# Patient Record
Sex: Female | Born: 1995
Health system: Southern US, Community
[De-identification: ages and names within clinical notes are randomized; demographics above are authoritative.]

## PROBLEM LIST (undated history)

## (undated) DIAGNOSIS — F329 Major depressive disorder, single episode, unspecified: Secondary | ICD-10-CM

## (undated) DIAGNOSIS — M352 Behcet's disease: Secondary | ICD-10-CM

## (undated) DIAGNOSIS — M2550 Pain in unspecified joint: Secondary | ICD-10-CM

## (undated) DIAGNOSIS — S61519A Laceration without foreign body of unspecified wrist, initial encounter: Secondary | ICD-10-CM

## (undated) DIAGNOSIS — F32A Depression, unspecified: Secondary | ICD-10-CM

## (undated) DIAGNOSIS — K219 Gastro-esophageal reflux disease without esophagitis: Secondary | ICD-10-CM

## (undated) DIAGNOSIS — Z8614 Personal history of Methicillin resistant Staphylococcus aureus infection: Secondary | ICD-10-CM

## (undated) DIAGNOSIS — F419 Anxiety disorder, unspecified: Secondary | ICD-10-CM

## (undated) DIAGNOSIS — S66929A Laceration of unspecified muscle, fascia and tendon at wrist and hand level, unspecified hand, initial encounter: Secondary | ICD-10-CM

## (undated) DIAGNOSIS — F4010 Social phobia, unspecified: Secondary | ICD-10-CM

## (undated) DIAGNOSIS — Z8669 Personal history of other diseases of the nervous system and sense organs: Secondary | ICD-10-CM

## (undated) HISTORY — DX: Social phobia, unspecified: F40.10

## (undated) HISTORY — PX: WISDOM TOOTH EXTRACTION: SHX21

## (undated) HISTORY — DX: Major depressive disorder, single episode, unspecified: F32.9

## (undated) HISTORY — DX: Depression, unspecified: F32.A

## (undated) HISTORY — DX: Anxiety disorder, unspecified: F41.9

---
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
## 2014-02-28 ENCOUNTER — Emergency Department (HOSPITAL_COMMUNITY)
Admission: EM | Admit: 2014-02-28 | Discharge: 2014-03-01 | Disposition: A | Discharge status: Home / Self Care | Payer: 59 | Attending: Emergency Medicine | Admitting: Emergency Medicine

## 2014-02-28 ENCOUNTER — Encounter (HOSPITAL_COMMUNITY): Payer: Self-pay | Admitting: Emergency Medicine

## 2014-02-28 DIAGNOSIS — S66922A Laceration of unspecified muscle, fascia and tendon at wrist and hand level, left hand, initial encounter: Secondary | ICD-10-CM | POA: Insufficient documentation

## 2014-02-28 DIAGNOSIS — Z3202 Encounter for pregnancy test, result negative: Secondary | ICD-10-CM | POA: Insufficient documentation

## 2014-02-28 DIAGNOSIS — Z79899 Other long term (current) drug therapy: Secondary | ICD-10-CM | POA: Insufficient documentation

## 2014-02-28 DIAGNOSIS — F321 Major depressive disorder, single episode, moderate: Secondary | ICD-10-CM | POA: Diagnosis present

## 2014-02-28 DIAGNOSIS — S66929A Laceration of unspecified muscle, fascia and tendon at wrist and hand level, unspecified hand, initial encounter: Secondary | ICD-10-CM

## 2014-02-28 DIAGNOSIS — X788XXA Intentional self-harm by other sharp object, initial encounter: Secondary | ICD-10-CM | POA: Insufficient documentation

## 2014-02-28 DIAGNOSIS — F1019 Alcohol abuse with unspecified alcohol-induced disorder: Secondary | ICD-10-CM

## 2014-02-28 DIAGNOSIS — Z7951 Long term (current) use of inhaled steroids: Secondary | ICD-10-CM | POA: Insufficient documentation

## 2014-02-28 DIAGNOSIS — F1010 Alcohol abuse, uncomplicated: Secondary | ICD-10-CM | POA: Diagnosis present

## 2014-02-28 DIAGNOSIS — Z8659 Personal history of other mental and behavioral disorders: Secondary | ICD-10-CM | POA: Diagnosis not present

## 2014-02-28 DIAGNOSIS — S61512A Laceration without foreign body of left wrist, initial encounter: Secondary | ICD-10-CM

## 2014-02-28 DIAGNOSIS — S61519A Laceration without foreign body of unspecified wrist, initial encounter: Secondary | ICD-10-CM

## 2014-02-28 DIAGNOSIS — X838XXA Intentional self-harm by other specified means, initial encounter: Secondary | ICD-10-CM

## 2014-02-28 HISTORY — DX: Laceration of unspecified muscle, fascia and tendon at wrist and hand level, unspecified hand, initial encounter: S66.929A

## 2014-02-28 HISTORY — DX: Laceration without foreign body of unspecified wrist, initial encounter: S61.519A

## 2014-02-28 LAB — URINALYSIS, ROUTINE W REFLEX MICROSCOPIC
Bilirubin Urine: NEGATIVE
Glucose, UA: NEGATIVE mg/dL
Ketones, ur: NEGATIVE mg/dL
Nitrite: NEGATIVE
Protein, ur: NEGATIVE mg/dL
Specific Gravity, Urine: 1.02 (ref 1.005–1.030)
Urobilinogen, UA: 0.2 mg/dL (ref 0.0–1.0)
pH: 5.5 (ref 5.0–8.0)

## 2014-02-28 LAB — COMPREHENSIVE METABOLIC PANEL
ALT: 14 U/L (ref 0–35)
AST: 18 U/L (ref 0–37)
Albumin: 3.9 g/dL (ref 3.5–5.2)
Alkaline Phosphatase: 46 U/L (ref 39–117)
Anion gap: 18 — ABNORMAL HIGH (ref 5–15)
BUN: 14 mg/dL (ref 6–23)
CO2: 22 mEq/L (ref 19–32)
Calcium: 9.2 mg/dL (ref 8.4–10.5)
Chloride: 105 mEq/L (ref 96–112)
Creatinine, Ser: 0.52 mg/dL (ref 0.50–1.10)
GFR calc Af Amer: >90 mL/min (ref >90)
GFR calc non Af Amer: >90 mL/min (ref >90)
Glucose, Bld: 81 mg/dL (ref 70–99)
Potassium: 3.8 mEq/L (ref 3.7–5.3)
Sodium: 145 mEq/L (ref 137–147)
Total Bilirubin: <0.2 mg/dL — ABNORMAL LOW (ref 0.3–1.2)
Total Protein: 8.9 g/dL — ABNORMAL HIGH (ref 6.0–8.3)

## 2014-02-28 LAB — CBC
HCT: 39.5 % (ref 36.0–46.0)
Hemoglobin: 13.3 g/dL (ref 12.0–15.0)
MCH: 28.4 pg (ref 26.0–34.0)
MCHC: 33.7 g/dL (ref 30.0–36.0)
MCV: 84.4 fL (ref 78.0–100.0)
Platelets: 217 10*3/uL (ref 150–400)
RBC: 4.68 MIL/uL (ref 3.87–5.11)
RDW: 13.3 % (ref 11.5–15.5)
WBC: 7.6 10*3/uL (ref 4.0–10.5)

## 2014-02-28 LAB — RAPID URINE DRUG SCREEN, HOSP PERFORMED
Amphetamines: NOT DETECTED
Barbiturates: NOT DETECTED
Benzodiazepines: NOT DETECTED
Cocaine: NOT DETECTED
Opiates: NOT DETECTED
Tetrahydrocannabinol: NOT DETECTED

## 2014-02-28 LAB — URINE MICROSCOPIC-ADD ON

## 2014-02-28 LAB — SALICYLATE LEVEL: Salicylate Lvl: <2 mg/dL — ABNORMAL LOW (ref 2.8–20.0)

## 2014-02-28 LAB — ETHANOL: Alcohol, Ethyl (B): 78 mg/dL — ABNORMAL HIGH (ref 0–11)

## 2014-02-28 LAB — POC URINE PREG, ED: Preg Test, Ur: NEGATIVE

## 2014-02-28 LAB — ACETAMINOPHEN LEVEL: Acetaminophen (Tylenol), Serum: <15 ug/mL (ref 10–30)

## 2014-02-28 MED ORDER — FLUTICASONE PROPIONATE 50 MCG/ACT NA SUSP
1.0000 | Freq: Every day | NASAL | Status: DC
  Administered 2014-02-28: 1 via NASAL
  Filled 2014-02-28 (×2): qty 16

## 2014-02-28 MED ORDER — IBUPROFEN 200 MG PO TABS
600.0000 mg | ORAL_TABLET | Freq: Three times a day (TID) | ORAL | Status: DC | PRN
  Administered 2014-02-28: 600 mg via ORAL
  Filled 2014-02-28: qty 3

## 2014-02-28 MED ORDER — MELATONIN-PYRIDOXINE 3-1 MG PO TABS: 3.0000 mg | ORAL_TABLET | Freq: Every day | ORAL | Status: DC

## 2014-02-28 MED ORDER — ZOLPIDEM TARTRATE 5 MG PO TABS
5.0000 mg | ORAL_TABLET | Freq: Every evening | ORAL | Status: DC | PRN
Start: 2014-02-28 — End: 2014-03-01
  Administered 2014-02-28: 5 mg via ORAL
  Filled 2014-02-28: qty 1

## 2014-02-28 MED ORDER — CERTOLIZUMAB PEGOL 2 X 200 MG/ML ~~LOC~~ KIT: 200.0000 mg | PACK | SUBCUTANEOUS | Status: DC

## 2014-02-28 MED ORDER — LORAZEPAM 2 MG/ML IJ SOLN
1.0000 mg | Freq: Once | INTRAMUSCULAR | Status: AC
  Administered 2014-02-28: 1 mg via INTRAVENOUS
  Filled 2014-02-28: qty 1

## 2014-02-28 MED ORDER — TETANUS-DIPHTH-ACELL PERTUSSIS 5-2.5-18.5 LF-MCG/0.5 IM SUSP
0.5000 mL | Freq: Once | INTRAMUSCULAR | Status: DC
  Filled 2014-02-28: qty 0.5

## 2014-02-28 MED ORDER — LORAZEPAM 1 MG PO TABS
1.0000 mg | ORAL_TABLET | Freq: Three times a day (TID) | ORAL | Status: DC | PRN
  Administered 2014-02-28: 1 mg via ORAL
  Filled 2014-02-28: qty 1

## 2014-02-28 MED ORDER — LIDOCAINE-EPINEPHRINE 2 %-1:100000 IJ SOLN
20.0000 mL | Freq: Once | INTRAMUSCULAR | Status: AC
  Administered 2014-02-28: 2.5 mL
  Filled 2014-02-28: qty 1

## 2014-02-28 MED ORDER — ACETAMINOPHEN 325 MG PO TABS: 650.0000 mg | ORAL_TABLET | ORAL | Status: DC | PRN

## 2014-02-28 MED ORDER — ONDANSETRON HCL 4 MG PO TABS: 4.0000 mg | ORAL_TABLET | Freq: Three times a day (TID) | ORAL | Status: DC | PRN

## 2014-02-28 MED ORDER — FLUOXETINE HCL 10 MG PO CAPS
10.0000 mg | ORAL_CAPSULE | Freq: Every day | ORAL | Status: DC
  Administered 2014-02-28 – 2014-03-01 (×2): 10 mg via ORAL
  Filled 2014-02-28 (×2): qty 1

## 2014-02-28 MED ORDER — NICOTINE 21 MG/24HR TD PT24
21.0000 mg | MEDICATED_PATCH | Freq: Every day | TRANSDERMAL | Status: DC
Start: 2014-02-28 — End: 2014-03-01

## 2014-02-28 MED ORDER — NORGESTIMATE-ETH ESTRADIOL 0.25-35 MG-MCG PO TABS
1.0000 | ORAL_TABLET | Freq: Every day | ORAL | Status: DC
  Administered 2014-02-28: 1 via ORAL

## 2014-02-28 NOTE — ED Provider Notes (Signed)
CSN: [id number]     Arrival date & time 02/28/14  1203 History   First MD Initiated Contact with Patient 02/28/14 1205     Chief Complaint  Patient presents with  . Laceration     (Consider location/radiation/quality/duration/timing/severity/associated sxs/prior Treatment) Patient is a 18 y.o. female presenting with skin laceration. The history is provided by the patient.  Laceration Location:  Shoulder/arm Shoulder/arm laceration location:  L wrist Depth:  Through underlying tissue Quality: straight   Bleeding: arterial and controlled   Laceration mechanism:  Razor Pain details:    Quality:  Aching   Severity:  Mild   Timing:  Constant   Progression:  Unchanged Foreign body present:  No foreign bodies Relieved by:  Nothing Worsened by:  Nothing tried Ineffective treatments:  None tried Tetanus status:  Up to date   Past Medical History  Diagnosis Date  . Depression   . Arthropathy, Behcet's    History reviewed. No pertinent past surgical history. Family History  Problem Relation Age of Onset  . Depression Mother   . ADD / ADHD Brother   . OCD Brother   . Bipolar disorder Maternal Uncle    History  Substance Use Topics  . Smoking status: Never Smoker   . Smokeless tobacco: Never Used  . Alcohol Use: No   OB History   Grav Para Term Preterm Abortions TAB SAB Ect Mult Living                 Review of Systems  Constitutional: Negative for fever.  Respiratory: Negative for cough and shortness of breath.   Gastrointestinal: Negative for vomiting and abdominal pain.  All other systems reviewed and are negative.     Allergies  Sulfamethoxazole-trimethoprim and Bactrim  Home Medications   Prior to Admission medications   Medication Sig Start Date End Date Taking? Authorizing Provider  Certolizumab Pegol (CIMZIA PREFILLED) 2 X 200 MG/ML KIT Inject 200 mg into the skin See admin instructions. Every two weeks    Historical Provider, MD  COLCRYS 0.6 MG  tablet Take 0.12 mg by mouth daily.  02/22/13   Historical Provider, MD  fluticasone (FLONASE) 50 MCG/ACT nasal spray Place 1 spray into both nostrils at bedtime.    Historical Provider, MD  ORTHO-CYCLEN, 28, 0.25-35 MG-MCG tablet Take 1 tablet by mouth at bedtime.  06/13/12   Historical Provider, MD   BP 122/87  Pulse 114  Temp(Src) 98.8 F (37.1 C) (Oral)  Resp 20  SpO2 100%  LMP 02/23/2014 Physical Exam  Nursing note and vitals reviewed. Constitutional: She is oriented to person, place, and time. She appears well-developed and well-nourished. No distress.  HENT:  Head: Normocephalic and atraumatic.  Mouth/Throat: Oropharynx is clear and moist.  Eyes: EOM are normal. Pupils are equal, round, and reactive to light.  Neck: Normal range of motion. Neck supple.  Cardiovascular: Normal rate and regular rhythm.  Exam reveals no friction rub.   No murmur heard. Pulmonary/Chest: Effort normal and breath sounds normal. No respiratory distress. She has no wheezes. She has no rales.  Abdominal: Soft. She exhibits no distension. There is no tenderness. There is no rebound.  Musculoskeletal: Normal range of motion. She exhibits no edema.       Arms: Neurological: She is alert and oriented to person, place, and time.  Skin: She is not diaphoretic.    ED Course  LACERATION REPAIR Date/Time: 02/28/2014 4:45 PM Performed by: Evelina Bucy Authorized by: Evelina Bucy Consent: Verbal consent obtained. Body  area: upper extremity Location details: left wrist Laceration length: 7 cm Foreign bodies: no foreign bodies Tendon involvement: complex Nerve involvement: none Vascular damage: no Anesthesia: local infiltration Local anesthetic: lidocaine 2% with epinephrine Anesthetic total: 3 ml Patient sedated: no Preparation: Patient was prepped and draped in the usual sterile fashion. Irrigation solution: saline Irrigation method: jet lavage Amount of cleaning: standard Debridement:  none Degree of undermining: none Skin closure: 3-0 Prolene Number of sutures: 6 Technique: simple Approximation: close Approximation difficulty: simple Dressing: 4x4 sterile gauze Patient tolerance: Patient tolerated the procedure well with no immediate complications.   (including critical care time) Labs Review Labs Reviewed  COMPREHENSIVE METABOLIC PANEL - Abnormal; Notable for the following:    Total Protein 8.9 (*)    Total Bilirubin <0.2 (*)    Anion gap 18 (*)    All other components within normal limits  URINALYSIS, ROUTINE W REFLEX MICROSCOPIC - Abnormal; Notable for the following:    APPearance CLOUDY (*)    Hgb urine dipstick SMALL (*)    Leukocytes, UA SMALL (*)    All other components within normal limits  ETHANOL - Abnormal; Notable for the following:    Alcohol, Ethyl (B) 78 (*)    All other components within normal limits  SALICYLATE LEVEL - Abnormal; Notable for the following:    Salicylate Lvl <0.7 (*)    All other components within normal limits  URINE MICROSCOPIC-ADD ON - Abnormal; Notable for the following:    Bacteria, UA FEW (*)    Casts GRANULAR CAST (*)    All other components within normal limits  CBC  ACETAMINOPHEN LEVEL  URINE RAPID DRUG SCREEN (HOSP PERFORMED)  POC URINE PREG, ED    Imaging Review No results found.   EKG Interpretation None      MDM   Final diagnoses:  Laceration of wrist with tendon involvement, left, initial encounter  Self-harm, initial encounter    2F presents with L wrist laceration. States was during a panic attack. Did not mean for it to be so deep. Hx of self harm with scratches to hips. Woke up and new boyfriend wasn't at her house and she was angry. No SI. Police taking out IVC. L wrist with deep laceration, tendon sheath exposed. NVI distally. No arterial bleeding, did have arterial bleeding at initial time of injury. Will be seen by Psych. Patient's wrist laceration complicated, has almost full  thickness laceration of flexor carpi ulnaris tendon. I spoke with Dr. Lenon Curt who will see patient as an outpatient if she goes home, but will need to be notified if she's staying as an inpatient so he can perform surgery. Splint ordered for her wrist.   Evelina Bucy, MD 02/28/14 [phone]

## 2014-02-28 NOTE — ED Notes (Signed)
Pt has pajama bottoms, grey tee shirt, and slippers in pt belongings bag.

## 2014-02-28 NOTE — ED Notes (Signed)
Belongings with father.

## 2014-02-28 NOTE — ED Notes (Signed)
Patient presents sad and tearful; denies any current thoughts of self harm, denies HI/AVH. NAD

## 2014-02-28 NOTE — ED Notes (Addendum)
Pt states "upset for a while. I did not want to be upset anymore so I cut myself. I would not do it again. It scared me."   Mingo Amber MD at bedside. Pt has good movement to left hand and fingers. Left radial pulse moderate. Pt denies numbness left hand.

## 2014-02-28 NOTE — ED Notes (Signed)
Bed: RESB Expected date:  Expected time:  Means of arrival:  Comments: EMS-wrist laceration

## 2014-02-28 NOTE — ED Notes (Addendum)
Per EMS pt cut wrist with razor blade in attempt to hurt self resulting in laceration exposing muscle and fat tissue. Blood controlled at present time. Pt states immediately regards what she did and not SI at present time.

## 2014-02-28 NOTE — Progress Notes (Signed)
PHARMACIST - PHYSICIAN ORDER COMMUNICATION  CONCERNING: P&T Medication Policy on Herbal Medications  DESCRIPTION:  This patient's order for:  Melatonin/Pyridoxine  has been noted.  This product(s) is classified as an "herbal" or natural product. Due to a lack of definitive safety studies or FDA approval, nonstandard manufacturing practices, plus the potential risk of unknown drug-drug interactions while on inpatient medications, the Pharmacy and Therapeutics Committee does not permit the use of "herbal" or natural products of this type within [facility].   ACTION TAKEN: The pharmacy department is unable to verify this order at this time and your patient has been informed of this safety policy. Please reevaluate patient's clinical condition at discharge and address if the herbal or natural product(s) should be resumed at that time.   Thanks, Garnet Sierras 02/28/2014

## 2014-02-28 NOTE — ED Notes (Signed)
At 1643, patient admitted to room 34 from [facility]. 18 year old female involuntarily committed after self-inflicted laceration to left wrist. Patient states that she cut her wrist after an argument with her boyfriend. Patient stated, "I just felt so bad. It was like it was all built up. I was not trying to kill myself. I was shocked when I saw all the blood." Patient reports history of depression and anxiety. Patient mood and affect anxious and depressed. Patient currently denies SI/HI/AVH.  Dressing clean, dry, and intact to left wrist. Splint applied to left wrist per orders. Patient denies pain at present, stating that her arm is still numb. Discussed pain management with patient and advised patient of prn medications. Family present at bedside. Patient tearful and anxious regarding overnight stay in unit. Support provided to patient and patient mother through active listening.
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
## 2014-03-01 ENCOUNTER — Encounter (HOSPITAL_COMMUNITY): Payer: Self-pay | Admitting: Psychiatry

## 2014-03-01 DIAGNOSIS — F329 Major depressive disorder, single episode, unspecified: Secondary | ICD-10-CM

## 2014-03-01 DIAGNOSIS — F1010 Alcohol abuse, uncomplicated: Secondary | ICD-10-CM

## 2014-03-01 MED ORDER — NORGESTIMATE-ETH ESTRADIOL 0.25-35 MG-MCG PO TABS: 1.0000 | ORAL_TABLET | Freq: Every day | ORAL | Status: DC

## 2014-03-01 MED ORDER — MELATONIN-PYRIDOXINE 3-1 MG PO TABS: 1.0000 | ORAL_TABLET | Freq: Every day | ORAL | Status: DC

## 2014-03-01 MED ORDER — FLUOXETINE HCL 10 MG PO CAPS: 10.0000 mg | ORAL_CAPSULE | Freq: Every day | ORAL | Status: DC

## 2014-03-01 MED ORDER — FLUTICASONE PROPIONATE 50 MCG/ACT NA SUSP: 1.0000 | Freq: Every day | NASAL | Status: DC

## 2014-03-01 MED ORDER — CERTOLIZUMAB PEGOL 2 X 200 MG/ML ~~LOC~~ KIT: 200.0000 mg | PACK | SUBCUTANEOUS | Status: DC

## 2014-03-01 NOTE — ED Notes (Signed)
Pt's mom into see

## 2014-03-01 NOTE — [facility] Suicide Risk Assessment (Signed)
Suicide Risk Assessment  Discharge Assessment     Demographic Factors:  Adolescent or young adult and Caucasian  Total Time spent with patient: 20 minutes   Psychiatric Specialty Exam: ROS - denies headache, denies chest pain, denies SOB, denies coughing, denies Abdominal Pain or Vomiting, Denies Dysuria/Urgency/Chills, denies rash, denies pain,  but states she is on Cimzia for Behcet's Disease . Forearm bandaged, no evidence of active bleeding at this time.    Blood pressure 116/81, pulse 110, temperature 98 F (36.7 C), temperature source Oral, resp. rate 12, last menstrual period 02/23/2014, SpO2 100.00%.There is no weight on file to calculate BMI.  General Appearance: Fairly Groomed  Engineer, water::  Good  Speech:  Normal Rate  Volume:  Normal  Mood:  Depressed and although affect is reactive   Affect:  Constricted and but reactive affect   Thought Process:  Goal Directed and Linear  Orientation:  Full (Time, Place, and Person)  Thought Content:  denies hallucinations, no delusions  Suicidal Thoughts:  No- at this time denies any suicidal thoughts and contracts for safety on this unit. States " I am scared about having cut- I will never do that again"   Homicidal Thoughts:  No  Memory:  recent and remote grossly intact   Judgement:  Fair  Insight:  Fair  Psychomotor Activity:  Normal  Concentration:  Good  Recall:  Good  Fund of Knowledge:Good  Language: Good  Akathisia:  Negative  Handed:  Right  AIMS (if indicated):     Assets:  Communication Skills Desire for Improvement Resilience Social Support  Sleep:      Musculoskeletal: Strength & Muscle Tone: within normal limits Gait & Station: normal Patient leans: N/A  Mental Status Per Nursing Assessment::   On Admission:   depression, cutting episode, alcohol abuse  Current Mental Status by Physician: NA  Loss Factors: NA  Historical Factors: Impulsivity  Risk Reduction Factors:   Sense of responsibility  to family, Living with another person, especially a relative, Positive social support and Positive therapeutic relationship  Continued Clinical Symptoms:  Depression, mild  Cognitive Features That Contribute To Risk:  None   Suicide Risk:  Minimal: No identifiable suicidal ideation.  Patients presenting with no risk factors but with morbid ruminations; may be classified as minimal risk based on the severity of the depressive symptoms  Discharge Diagnoses:   AXIS I:  Alcohol Abuse; depression AXIS II:  Cluster B Traits AXIS III:   Past Medical History  Diagnosis Date  . Depression   . Arthropathy, Behcet's    AXIS IV:  other psychosocial or environmental problems and problems related to social environment AXIS V:  61-70 mild symptoms  Plan Of Care/Follow-up recommendations:  Activity:  as tolerated Diet:  heart healthy  Is patient on multiple antipsychotic therapies at discharge:  No   Has Patient had three or more failed trials of antipsychotic monotherapy by history:  No  Recommended Plan for Multiple Antipsychotic Therapies: NA    Majorie Santee, PMH-NP 03/01/2014, 12:15 PM

## 2014-03-01 NOTE — Consult Note (Signed)
[facility] Face-to-Face Psychiatry Consult   Reason for Consult: Episode of self cutting/ suicide attempt Referring Physician:  ED Physician WILLENE HOLIAN is an 18 y.o. female. Total Time spent with patient: 30 minutes  Assessment: AXIS I:  Depression NOS, consider Dysthymia. Alcohol Abuse.  AXIS II:  Cluster B Traits AXIS III:   Past Medical History  Diagnosis Date  . Depression   . Arthropathy, Behcet's    AXIS IV:  relationship stressors AXIS V:  69; mild symptoms  Plan:  No evidence of imminent risk to self or others at present.  Dr. Parke Poisson assessed the patient and concurs with the plan.  Subjective:   KARMINA ZUFALL is a 18 y.o. female patient will go to [facility] for depression and alcohol abuse.  HPI:   Patient denies suicidal/homicidal ideations, hallucinations, and drug abuse.  Her mother is in the room during the assessment (patient approved) and feels she is safe to return home.  Both are in agreement for her to attend [place] [facility] for substance abuse and depression; alcoholism runs in her family and wants to head off any future problems.  Miraya is scheduled to see a new psychiatrist in a couple of weeks also.   She will return home where she lives with her parents who are supportive.  HPI Elements:   Severe self inflicted laceration on forearm, in the context of acute stressor. History of low grade depression and prior history of self cutting.  Past Psychiatric History: See heading above  Past Medical History  Diagnosis Date  . Depression   . Arthropathy, Behcet's    Parents live together, patient has one brother. Has an uncle who was alcoholic. Denies suicides in family. Mother has history of depression. Family History  Problem Relation Age of Onset  . Depression Mother   . ADD / ADHD Brother   . OCD Brother   . Bipolar disorder Maternal Uncle            Allergies:   Allergies  Allergen Reactions  . Sulfamethoxazole-Trimethoprim Diarrhea and Other (See  Comments)    Severe painful diarrhe  . Bactrim [Sulfamethoxazole-Tmp Ds] Hives, Nausea Only and Swelling     Objective: Blood pressure 116/81, pulse 110, temperature 98 F (36.7 C), temperature source Oral, resp. rate 12, last menstrual period 02/23/2014, SpO2 100.00%.There is no weight on file to calculate BMI. Results for orders placed during the hospital encounter of 02/28/14 (from the past 72 hour(s))  CBC     Status: None   Collection Time    02/28/14 12:30 PM      Result Value Ref Range   WBC 7.6  4.0 - 10.5 K/uL   RBC 4.68  3.87 - 5.11 MIL/uL   Hemoglobin 13.3  12.0 - 15.0 g/dL   HCT 39.5  36.0 - 46.0 %   MCV 84.4  78.0 - 100.0 fL   MCH 28.4  26.0 - 34.0 pg   MCHC 33.7  30.0 - 36.0 g/dL   RDW 13.3  11.5 - 15.5 %   Platelets 217  150 - 400 K/uL  COMPREHENSIVE METABOLIC PANEL     Status: Abnormal   Collection Time    02/28/14 12:30 PM      Result Value Ref Range   Sodium 145  137 - 147 mEq/L   Potassium 3.8  3.7 - 5.3 mEq/L   Chloride 105  96 - 112 mEq/L   CO2 22  19 - 32 mEq/L   Glucose, Bld  81  70 - 99 mg/dL   BUN 14  6 - 23 mg/dL   Creatinine, Ser 0.52  0.50 - 1.10 mg/dL   Calcium 9.2  8.4 - 10.5 mg/dL   Total Protein 8.9 (*) 6.0 - 8.3 g/dL   Albumin 3.9  3.5 - 5.2 g/dL   AST 18  0 - 37 U/L   ALT 14  0 - 35 U/L   Alkaline Phosphatase 46  39 - 117 U/L   Total Bilirubin <0.2 (*) 0.3 - 1.2 mg/dL   GFR calc non Af Amer >90  >90 mL/min   GFR calc Af Amer >90  >90 mL/min   Comment: (NOTE)     The eGFR has been calculated using the CKD EPI equation.     This calculation has not been validated in all clinical situations.     eGFR's persistently <90 mL/min signify possible Chronic Kidney     Disease.   Anion gap 18 (*) 5 - 15  ETHANOL     Status: Abnormal   Collection Time    02/28/14 12:30 PM      Result Value Ref Range   Alcohol, Ethyl (B) 78 (*) 0 - 11 mg/dL   Comment:            LOWEST DETECTABLE LIMIT FOR     SERUM ALCOHOL IS 11 mg/dL     FOR MEDICAL  PURPOSES ONLY  SALICYLATE LEVEL     Status: Abnormal   Collection Time    02/28/14 12:30 PM      Result Value Ref Range   Salicylate Lvl <1.6 (*) 2.8 - 20.0 mg/dL  ACETAMINOPHEN LEVEL     Status: None   Collection Time    02/28/14 12:30 PM      Result Value Ref Range   Acetaminophen (Tylenol), Serum <15.0  10 - 30 ug/mL   Comment:            THERAPEUTIC CONCENTRATIONS VARY     SIGNIFICANTLY. A RANGE OF 10-30     ug/mL MAY BE AN EFFECTIVE     CONCENTRATION FOR MANY PATIENTS.     HOWEVER, SOME ARE BEST TREATED     AT CONCENTRATIONS OUTSIDE THIS     RANGE.     ACETAMINOPHEN CONCENTRATIONS     >150 ug/mL AT 4 HOURS AFTER     INGESTION AND >50 ug/mL AT 12     HOURS AFTER INGESTION ARE     OFTEN ASSOCIATED WITH TOXIC     REACTIONS.  URINALYSIS, ROUTINE W REFLEX MICROSCOPIC     Status: Abnormal   Collection Time    02/28/14  1:29 PM      Result Value Ref Range   Color, Urine YELLOW  YELLOW   APPearance CLOUDY (*) CLEAR   Specific Gravity, Urine 1.020  1.005 - 1.030   pH 5.5  5.0 - 8.0   Glucose, UA NEGATIVE  NEGATIVE mg/dL   Hgb urine dipstick SMALL (*) NEGATIVE   Bilirubin Urine NEGATIVE  NEGATIVE   Ketones, ur NEGATIVE  NEGATIVE mg/dL   Protein, ur NEGATIVE  NEGATIVE mg/dL   Urobilinogen, UA 0.2  0.0 - 1.0 mg/dL   Nitrite NEGATIVE  NEGATIVE   Leukocytes, UA SMALL (*) NEGATIVE  URINE RAPID DRUG SCREEN (HOSP PERFORMED)     Status: None   Collection Time    02/28/14  1:29 PM      Result Value Ref Range   Opiates NONE DETECTED  NONE DETECTED  Cocaine NONE DETECTED  NONE DETECTED   Benzodiazepines NONE DETECTED  NONE DETECTED   Amphetamines NONE DETECTED  NONE DETECTED   Tetrahydrocannabinol NONE DETECTED  NONE DETECTED   Barbiturates NONE DETECTED  NONE DETECTED   Comment:            DRUG SCREEN FOR MEDICAL PURPOSES     ONLY.  IF CONFIRMATION IS NEEDED     FOR ANY PURPOSE, NOTIFY LAB     WITHIN 5 DAYS.                LOWEST DETECTABLE LIMITS     FOR URINE DRUG SCREEN      Drug Class       Cutoff (ng/mL)     Amphetamine      1000     Barbiturate      200     Benzodiazepine   680     Tricyclics       321     Opiates          300     Cocaine          300     THC              50  URINE MICROSCOPIC-ADD ON     Status: Abnormal   Collection Time    02/28/14  1:29 PM      Result Value Ref Range   WBC, UA 7-10  <3 WBC/hpf   RBC / HPF 0-2  <3 RBC/hpf   Bacteria, UA FEW (*) RARE   Casts GRANULAR CAST (*) NEGATIVE   Urine-Other MUCOUS PRESENT    POC URINE PREG, ED     Status: None   Collection Time    02/28/14  1:39 PM      Result Value Ref Range   Preg Test, Ur NEGATIVE  NEGATIVE   Comment:            THE SENSITIVITY OF THIS     METHODOLOGY IS >24 mIU/mL   Labs are reviewed and are pertinent for  (+) BAL upon admission  Current Facility-Administered Medications  Medication Dose Route Frequency Provider Last Rate Last Dose  . acetaminophen (TYLENOL) tablet 650 mg  650 mg Oral Q4H PRN Evelina Bucy, MD      . Derrill Memo ON 22/48/2500] Certolizumab Pegol KIT 200 mg  200 mg Subcutaneous Once every 10 days Evelina Bucy, MD      . FLUoxetine (PROZAC) capsule 10 mg  10 mg Oral Daily Neita Garnet, MD   10 mg at 03/01/14 1108  . fluticasone (FLONASE) 50 MCG/ACT nasal spray 1 spray  1 spray Each Nare QHS Evelina Bucy, MD   1 spray at 02/28/14 2144  . ibuprofen (ADVIL,MOTRIN) tablet 600 mg  600 mg Oral Q8H PRN Evelina Bucy, MD   600 mg at 02/28/14 2020  . LORazepam (ATIVAN) tablet 1 mg  1 mg Oral Q8H PRN Evelina Bucy, MD   1 mg at 02/28/14 2123  . nicotine (NICODERM CQ - dosed in mg/24 hours) patch 21 mg  21 mg Transdermal Daily Evelina Bucy, MD      . norgestimate-ethinyl estradiol (ORTHO-CYCLEN,SPRINTEC,PREVIFEM) 0.25-35 MG-MCG tablet 1 tablet  1 tablet Oral QHS Mariea Clonts, MD   1 tablet at 02/28/14 2126  . ondansetron (ZOFRAN) tablet 4 mg  4 mg Oral Q8H PRN Evelina Bucy, MD      . Tdap Durwin Reges) injection 0.5 mL  0.5 mL Intramuscular Once Evelina Bucy,  MD       . zolpidem (AMBIEN) tablet 5 mg  5 mg Oral QHS PRN Evelina Bucy, MD   5 mg at 02/28/14 2123   Current Outpatient Prescriptions  Medication Sig Dispense Refill  . Certolizumab Pegol (CIMZIA PREFILLED) 2 X 200 MG/ML KIT Inject 200 mg into the skin See admin instructions. Every 10 days      . fluticasone (FLONASE) 50 MCG/ACT nasal spray Place 1 spray into both nostrils at bedtime.      . Melatonin-Pyridoxine (MELATIN PO) Take 1 tablet by mouth daily.      . ORTHO-CYCLEN, 28, 0.25-35 MG-MCG tablet Take 1 tablet by mouth at bedtime.         Psychiatric Specialty Exam: ROS - denies headache, denies chest pain, denies SOB, denies coughing, denies Abdominal Pain or Vomiting, Denies Dysuria/Urgency/Chills, denies rash, denies pain,  but states she is on Cimzia for Behcet's Disease . Forearm bandaged, no evidence of active bleeding at this time.    Blood pressure 116/81, pulse 110, temperature 98 F (36.7 C), temperature source Oral, resp. rate 12, last menstrual period 02/23/2014, SpO2 100.00%.There is no weight on file to calculate BMI.  General Appearance: Fairly Groomed  Engineer, water::  Good  Speech:  Normal Rate  Volume:  Normal  Mood:  Depressed and although affect is reactive   Affect:  Constricted and but reactive affect   Thought Process:  Goal Directed and Linear  Orientation:  Full (Time, Place, and Person)  Thought Content:  denies hallucinations, no delusions  Suicidal Thoughts:  No- at this time denies any suicidal thoughts and contracts for safety on this unit. States " I am scared about having cut- I will never do that again"   Homicidal Thoughts:  No  Memory:  recent and remote grossly intact   Judgement:  Fair  Insight:  Fair  Psychomotor Activity:  Normal  Concentration:  Good  Recall:  Good  Fund of Knowledge:Good  Language: Good  Akathisia:  Negative  Handed:  Right  AIMS (if indicated):     Assets:  Communication Skills Desire for Improvement Resilience Social  Support  Sleep:      Musculoskeletal: Strength & Muscle Tone: within normal limits Gait & Station: normal Patient leans: N/A  Treatment Plan Summary: Discharge home with her mother and follow-up with [facility] for depression and alcohol abuse, psychiatrist appointment in 2 weeks.   Waylan Boga, [place] 03/01/2014 12:09 PM  Patient seen and assessed with NP as above

## 2014-03-01 NOTE — BH Assessment (Signed)
Clinician provided patient information to [facility] IOP as recommended by Dr. Parke Poisson. Patient inquired if she was being discharged today. Clinician confirmed plan to discharge later today.   Rigoberto Noel, MSW, LCSW Triage Specialist [phone]

## 2014-03-01 NOTE — ED Notes (Signed)
Dr Parke Poisson and jamisonj np in w/ pt and her mother

## 2014-03-01 NOTE — ED Notes (Addendum)
Splint intact, dressing CDI. Written dc instructions reviewed w/ Pt and mom.  Pt encouraged to call Dr Lenon Curt Monday as instructed, call IOP on Monday and schedule her appt, keep her scheduled appt in 2 weeks and take her medications as directed.  Watch for infection (redness/swelling/drainage/unusual pain) and changes in circulation/sensation in fingers and to wear the splint until she sees Dr Lenon Curt.  Both verbalized understanding.  Pt ambulatory to dc window w/ mom and mHt. Pt has not belongings.

## 2014-03-01 NOTE — ED Notes (Signed)
Dr Parke Poisson and Theodoro Clock NP into see

## 2014-03-01 NOTE — ED Notes (Signed)
IVC has been rescinded

## 2014-03-03 ENCOUNTER — Other Ambulatory Visit: Payer: Self-pay | Admitting: Orthopedic Surgery

## 2014-03-03 ENCOUNTER — Encounter (HOSPITAL_BASED_OUTPATIENT_CLINIC_OR_DEPARTMENT_OTHER): Payer: Self-pay | Admitting: *Deleted

## 2014-03-04 ENCOUNTER — Encounter (HOSPITAL_BASED_OUTPATIENT_CLINIC_OR_DEPARTMENT_OTHER): Payer: Self-pay

## 2014-03-04 ENCOUNTER — Encounter (HOSPITAL_BASED_OUTPATIENT_CLINIC_OR_DEPARTMENT_OTHER)
Admission: RE | Disposition: A | Discharge status: Home / Self Care | Payer: Self-pay | Source: Ambulatory Visit | Attending: Orthopedic Surgery

## 2014-03-04 ENCOUNTER — Ambulatory Visit (HOSPITAL_BASED_OUTPATIENT_CLINIC_OR_DEPARTMENT_OTHER)
Admission: RE | Admit: 2014-03-04 | Discharge: 2014-03-04 | Disposition: A | Discharge status: Home / Self Care | Payer: 59 | Source: Ambulatory Visit | Attending: Orthopedic Surgery | Admitting: Orthopedic Surgery

## 2014-03-04 ENCOUNTER — Ambulatory Visit (HOSPITAL_BASED_OUTPATIENT_CLINIC_OR_DEPARTMENT_OTHER): Payer: 59 | Admitting: Anesthesiology

## 2014-03-04 ENCOUNTER — Encounter (HOSPITAL_BASED_OUTPATIENT_CLINIC_OR_DEPARTMENT_OTHER): Payer: 59 | Admitting: Anesthesiology

## 2014-03-04 DIAGNOSIS — Z882 Allergy status to sulfonamides status: Secondary | ICD-10-CM | POA: Insufficient documentation

## 2014-03-04 DIAGNOSIS — S66822A Laceration of other specified muscles, fascia and tendons at wrist and hand level, left hand, initial encounter: Secondary | ICD-10-CM | POA: Insufficient documentation

## 2014-03-04 DIAGNOSIS — Z888 Allergy status to other drugs, medicaments and biological substances status: Secondary | ICD-10-CM | POA: Diagnosis not present

## 2014-03-04 DIAGNOSIS — S55012A Laceration of ulnar artery at forearm level, left arm, initial encounter: Secondary | ICD-10-CM | POA: Insufficient documentation

## 2014-03-04 DIAGNOSIS — Z91048 Other nonmedicinal substance allergy status: Secondary | ICD-10-CM | POA: Insufficient documentation

## 2014-03-04 DIAGNOSIS — Y9389 Activity, other specified: Secondary | ICD-10-CM | POA: Insufficient documentation

## 2014-03-04 DIAGNOSIS — S61512A Laceration without foreign body of left wrist, initial encounter: Secondary | ICD-10-CM | POA: Diagnosis present

## 2014-03-04 DIAGNOSIS — Z91011 Allergy to milk products: Secondary | ICD-10-CM | POA: Diagnosis not present

## 2014-03-04 DIAGNOSIS — Z8614 Personal history of Methicillin resistant Staphylococcus aureus infection: Secondary | ICD-10-CM | POA: Diagnosis not present

## 2014-03-04 DIAGNOSIS — Y9289 Other specified places as the place of occurrence of the external cause: Secondary | ICD-10-CM | POA: Diagnosis not present

## 2014-03-04 DIAGNOSIS — Y998 Other external cause status: Secondary | ICD-10-CM | POA: Diagnosis not present

## 2014-03-04 DIAGNOSIS — X788XXA Intentional self-harm by other sharp object, initial encounter: Secondary | ICD-10-CM | POA: Diagnosis not present

## 2014-03-04 DIAGNOSIS — M352 Behcet's disease: Secondary | ICD-10-CM | POA: Insufficient documentation

## 2014-03-04 DIAGNOSIS — F17210 Nicotine dependence, cigarettes, uncomplicated: Secondary | ICD-10-CM | POA: Insufficient documentation

## 2014-03-04 DIAGNOSIS — F329 Major depressive disorder, single episode, unspecified: Secondary | ICD-10-CM | POA: Diagnosis not present

## 2014-03-04 HISTORY — DX: Behcet's disease: M35.2

## 2014-03-04 HISTORY — DX: Personal history of other diseases of the nervous system and sense organs: Z86.69

## 2014-03-04 HISTORY — DX: Laceration without foreign body of unspecified wrist, initial encounter: S61.519A

## 2014-03-04 HISTORY — DX: Personal history of Methicillin resistant Staphylococcus aureus infection: Z86.14

## 2014-03-04 HISTORY — DX: Laceration of unspecified muscle, fascia and tendon at wrist and hand level, unspecified hand, initial encounter: S66.929A

## 2014-03-04 HISTORY — PX: ARTERY AND TENDON REPAIR: SHX5696

## 2014-03-04 HISTORY — PX: WOUND EXPLORATION: SHX6188

## 2014-03-04 LAB — POCT HEMOGLOBIN-HEMACUE: Hemoglobin: 11.3 g/dL — ABNORMAL LOW (ref 12.0–15.0)

## 2014-03-04 SURGERY — WOUND EXPLORATION
Anesthesia: General | Site: Wrist | Laterality: Left

## 2014-03-04 MED ORDER — DEXAMETHASONE SODIUM PHOSPHATE 4 MG/ML IJ SOLN
INTRAMUSCULAR | Status: DC | PRN
  Administered 2014-03-04: 5 mg via INTRAVENOUS

## 2014-03-04 MED ORDER — BUPIVACAINE HCL (PF) 0.25 % IJ SOLN
INTRAMUSCULAR | Status: DC | PRN
  Administered 2014-03-04: 8 mL

## 2014-03-04 MED ORDER — LIDOCAINE HCL (CARDIAC) 20 MG/ML IV SOLN
INTRAVENOUS | Status: DC | PRN
  Administered 2014-03-04: 100 mg via INTRAVENOUS

## 2014-03-04 MED ORDER — DOXYCYCLINE HYCLATE 50 MG PO CAPS: 100.0000 mg | ORAL_CAPSULE | Freq: Two times a day (BID) | ORAL | Status: DC

## 2014-03-04 MED ORDER — MIDAZOLAM HCL 2 MG/2ML IJ SOLN
INTRAMUSCULAR | Status: AC
  Filled 2014-03-04: qty 2

## 2014-03-04 MED ORDER — FENTANYL CITRATE 0.05 MG/ML IJ SOLN
INTRAMUSCULAR | Status: DC | PRN
  Administered 2014-03-04: 50 ug via INTRAVENOUS

## 2014-03-04 MED ORDER — ONDANSETRON HCL 4 MG/2ML IJ SOLN
INTRAMUSCULAR | Status: DC | PRN
  Administered 2014-03-04: 4 mg via INTRAVENOUS

## 2014-03-04 MED ORDER — MIDAZOLAM HCL 2 MG/2ML IJ SOLN: 1.0000 mg | INTRAMUSCULAR | Status: DC | PRN

## 2014-03-04 MED ORDER — LIDOCAINE HCL (PF) 1 % IJ SOLN
INTRAVENOUS | Status: DC | PRN
  Administered 2014-03-04: 16:00:00

## 2014-03-04 MED ORDER — PROPOFOL 10 MG/ML IV BOLUS
INTRAVENOUS | Status: DC | PRN
  Administered 2014-03-04: 150 mg via INTRAVENOUS

## 2014-03-04 MED ORDER — CEFAZOLIN SODIUM 1-5 GM-% IV SOLN
INTRAVENOUS | Status: AC
  Filled 2014-03-04: qty 50

## 2014-03-04 MED ORDER — CEFAZOLIN SODIUM-DEXTROSE 2-3 GM-% IV SOLR
2.0000 g | INTRAVENOUS | Status: AC
  Administered 2014-03-04: 1 g via INTRAVENOUS

## 2014-03-04 MED ORDER — FENTANYL CITRATE 0.05 MG/ML IJ SOLN: 50.0000 ug | INTRAMUSCULAR | Status: DC | PRN

## 2014-03-04 MED ORDER — HYDROCODONE-ACETAMINOPHEN 5-325 MG PO TABS: ORAL_TABLET | ORAL | Status: DC

## 2014-03-04 MED ORDER — MIDAZOLAM HCL 5 MG/5ML IJ SOLN
INTRAMUSCULAR | Status: DC | PRN
  Administered 2014-03-04: 2 mg via INTRAVENOUS

## 2014-03-04 MED ORDER — BUPIVACAINE HCL (PF) 0.25 % IJ SOLN
INTRAMUSCULAR | Status: AC
  Filled 2014-03-04: qty 30

## 2014-03-04 MED ORDER — CHLORHEXIDINE GLUCONATE 4 % EX LIQD: 60.0000 mL | Freq: Once | CUTANEOUS | Status: DC

## 2014-03-04 MED ORDER — HEPARIN SODIUM (PORCINE) 1000 UNIT/ML IJ SOLN
INTRAMUSCULAR | Status: AC
  Filled 2014-03-04: qty 1

## 2014-03-04 MED ORDER — LIDOCAINE HCL (PF) 1 % IJ SOLN
INTRAMUSCULAR | Status: AC
  Filled 2014-03-04: qty 30

## 2014-03-04 MED ORDER — MIDAZOLAM HCL 2 MG/ML PO SYRP
12.0000 mg | ORAL_SOLUTION | Freq: Once | ORAL | Status: DC | PRN
Start: 2014-03-04 — End: 2014-03-04

## 2014-03-04 MED ORDER — LACTATED RINGERS IV SOLN
INTRAVENOUS | Status: DC
  Administered 2014-03-04 (×2): via INTRAVENOUS

## 2014-03-04 MED ORDER — PROPOFOL 10 MG/ML IV BOLUS
INTRAVENOUS | Status: AC
  Filled 2014-03-04: qty 20

## 2014-03-04 MED ORDER — FENTANYL CITRATE 0.05 MG/ML IJ SOLN
INTRAMUSCULAR | Status: AC
  Filled 2014-03-04: qty 4

## 2014-03-04 SURGICAL SUPPLY — 60 items
BAG DECANTER FOR FLEXI CONT (MISCELLANEOUS) ×2 IMPLANT
BANDAGE ELASTIC 3 VELCRO ST LF (GAUZE/BANDAGES/DRESSINGS) ×3 IMPLANT
BLADE MINI RND TIP GREEN BEAV (BLADE) IMPLANT
BLADE SURG 15 STRL LF DISP TIS (BLADE) ×4 IMPLANT
BLADE SURG 15 STRL SS (BLADE) ×6
BNDG CMPR 9X4 STRL LF SNTH (GAUZE/BANDAGES/DRESSINGS) ×2
BNDG ESMARK 4X9 LF (GAUZE/BANDAGES/DRESSINGS) ×2 IMPLANT
BNDG GAUZE ELAST 4 BULKY (GAUZE/BANDAGES/DRESSINGS) ×3 IMPLANT
CHLORAPREP W/TINT 26ML (MISCELLANEOUS) ×3 IMPLANT
CORDS BIPOLAR (ELECTRODE) ×3 IMPLANT
COVER BACK TABLE 60X90IN (DRAPES) ×3 IMPLANT
COVER MAYO STAND STRL (DRAPES) ×3 IMPLANT
CUFF TOURNIQUET SINGLE 18IN (TOURNIQUET CUFF) ×2 IMPLANT
DECANTER SPIKE VIAL GLASS SM (MISCELLANEOUS) ×1 IMPLANT
DRAPE EXTREMITY TIBURON (DRAPES) ×3 IMPLANT
DRAPE SURG 17X23 STRL (DRAPES) ×3 IMPLANT
GAUZE SPONGE 4X4 12PLY STRL (GAUZE/BANDAGES/DRESSINGS) ×3 IMPLANT
GAUZE XEROFORM 1X8 LF (GAUZE/BANDAGES/DRESSINGS) ×3 IMPLANT
GLOVE BIO SURGEON STRL SZ7.5 (GLOVE) ×3 IMPLANT
GLOVE BIO SURGEON STRL SZ8.5 (GLOVE) ×2 IMPLANT
GLOVE BIOGEL PI IND STRL 7.0 (GLOVE) ×1 IMPLANT
GLOVE BIOGEL PI IND STRL 8 (GLOVE) ×2 IMPLANT
GLOVE BIOGEL PI INDICATOR 7.0 (GLOVE) ×1
GLOVE BIOGEL PI INDICATOR 8 (GLOVE) ×1
GLOVE ECLIPSE 6.5 STRL STRAW (GLOVE) ×2 IMPLANT
GOWN STRL REUS W/ TWL LRG LVL3 (GOWN DISPOSABLE) ×2 IMPLANT
GOWN STRL REUS W/TWL LRG LVL3 (GOWN DISPOSABLE) ×3
GOWN STRL REUS W/TWL XL LVL3 (GOWN DISPOSABLE) ×5 IMPLANT
LOOP VESSEL MAXI BLUE (MISCELLANEOUS) IMPLANT
NDL HYPO 25X1 1.5 SAFETY (NEEDLE) IMPLANT
NDL SAFETY ECLIPSE 18X1.5 (NEEDLE) ×1 IMPLANT
NEEDLE HYPO 18GX1.5 SHARP (NEEDLE) ×3
NEEDLE HYPO 25X1 1.5 SAFETY (NEEDLE) ×3 IMPLANT
NS IRRIG 1000ML POUR BTL (IV SOLUTION) ×3 IMPLANT
PACK BASIN DAY SURGERY FS (CUSTOM PROCEDURE TRAY) ×3 IMPLANT
PAD CAST 3X4 CTTN HI CHSV (CAST SUPPLIES) ×2 IMPLANT
PAD CAST 4YDX4 CTTN HI CHSV (CAST SUPPLIES) IMPLANT
PADDING CAST ABS 4INX4YD NS (CAST SUPPLIES)
PADDING CAST ABS COTTON 4X4 ST (CAST SUPPLIES) ×1 IMPLANT
PADDING CAST COTTON 3X4 STRL (CAST SUPPLIES) ×3
PADDING CAST COTTON 4X4 STRL (CAST SUPPLIES)
SLEEVE SCD COMPRESS KNEE MED (MISCELLANEOUS) ×2 IMPLANT
SPEAR EYE SURG WECK-CEL (MISCELLANEOUS) ×3 IMPLANT
SPLINT PLASTER CAST XFAST 3X15 (CAST SUPPLIES) ×10 IMPLANT
SPLINT PLASTER XTRA FASTSET 3X (CAST SUPPLIES) ×10
STOCKINETTE 4X48 STRL (DRAPES) ×3 IMPLANT
SUT ETHIBOND 3-0 V-5 (SUTURE) IMPLANT
SUT ETHILON 4 0 PS 2 18 (SUTURE) ×3 IMPLANT
SUT FIBERWIRE 4-0 18 TAPR NDL (SUTURE) ×3
SUT MERSILENE 6 0 P 1 (SUTURE) IMPLANT
SUT NYLON 9 0 VRM6 (SUTURE) ×2 IMPLANT
SUT PROLENE 6 0 P 1 18 (SUTURE) IMPLANT
SUT SILK 4 0 PS 2 (SUTURE) ×2 IMPLANT
SUT SUPRAMID 4-0 (SUTURE) IMPLANT
SUT VICRYL 4-0 PS2 18IN ABS (SUTURE) IMPLANT
SUTURE FIBERWR 4-0 18 TAPR NDL (SUTURE) ×1 IMPLANT
SYR BULB 3OZ (MISCELLANEOUS) ×3 IMPLANT
SYR CONTROL 10ML LL (SYRINGE) ×4 IMPLANT
TOWEL OR 17X24 6PK STRL BLUE (TOWEL DISPOSABLE) ×6 IMPLANT
UNDERPAD 30X30 INCONTINENT (UNDERPADS AND DIAPERS) ×3 IMPLANT

## 2014-03-04 NOTE — Anesthesia Procedure Notes (Signed)
Procedure Name: LMA Insertion Date/Time: 03/04/2014 3:17 PM Performed by: Lyndee Leo Pre-anesthesia Checklist: Patient identified, Emergency Drugs available, Suction available and Patient being monitored Patient Re-evaluated:Patient Re-evaluated prior to inductionOxygen Delivery Method: Circle System Utilized Preoxygenation: Pre-oxygenation with 100% oxygen Intubation Type: IV induction Ventilation: Mask ventilation without difficulty LMA: LMA inserted LMA Size: 3.0 Number of attempts: 1 Airway Equipment and Method: bite block Placement Confirmation: positive ETCO2 Tube secured with: Tape Dental Injury: Teeth and Oropharynx as per pre-operative assessment

## 2014-03-04 NOTE — Anesthesia Postprocedure Evaluation (Signed)
Anesthesia Post Note  Patient: Jacqueline Simpson  Procedure(s) Performed: Procedure(s) (LRB): LEFT WRIST EXPLORATION (Left) ARTERY AND TENDON REPAIR (Left)  Anesthesia type: general  Patient location: PACU  Post pain: Pain level controlled  Post assessment: Patient's Cardiovascular Status Stable  Last Vitals:  Filed Vitals:   03/04/14 1700  BP: 108/67  Pulse: 81  Temp:   Resp: 17    Post vital signs: Reviewed and stable  Level of consciousness: sedated  Complications: No apparent anesthesia complications

## 2014-03-04 NOTE — Discharge Instructions (Addendum)

## 2014-03-04 NOTE — Op Note (Signed)
[id number] 

## 2014-03-04 NOTE — Brief Op Note (Signed)
03/04/2014  4:31 PM  PATIENT:  [name]  18 y.o. female  PRE-OPERATIVE DIAGNOSIS:  Left Wrist Laceraton Tendon/Artery/Nerve  POST-OPERATIVE DIAGNOSIS:  Left Wrist Laceration with FCU, palmaris longus, and ulnar artery lacerations  PROCEDURE:  Procedure(s): LEFT WRIST EXPLORATION (Left) ARTERY AND TENDON REPAIR (Left)  SURGEON:  Surgeon(s) and Role:    * Leanora Cover, MD - Primary    * Daryll Brod, MD - Assisting  PHYSICIAN ASSISTANT:   ASSISTANTS: Daryll Brod, MD   ANESTHESIA:   general  EBL:  Total I/O In: 1000 [I.V.:1000] Out: -   BLOOD ADMINISTERED:none  DRAINS: none   LOCAL MEDICATIONS USED:  MARCAINE     SPECIMEN:  No Specimen  DISPOSITION OF SPECIMEN:  N/A  COUNTS:  YES  TOURNIQUET:   Total Tourniquet Time Documented: Upper Arm (Left) - 56 minutes Total: Upper Arm (Left) - 56 minutes   DICTATION: .Other Dictation: Dictation Number [id number]  PLAN OF CARE: Discharge to home after PACU  PATIENT DISPOSITION:  PACU - hemodynamically stable.

## 2014-03-04 NOTE — H&P (Signed)
  Jacqueline Simpson is an 18 y.o. female.   Chief Complaint: left wrist laceration HPI: 18 yo rhd female states she lacerated left wrist 02/28/14 with razor blade.  Seen at [facility] where wound explored and sutured.  Noted to have laceration to FCU tendon.  Followed up in office.  Reports no previous injury to left arm and no other injury at this time.  Past Medical History  Diagnosis Date  . Depression   . Behcet's disease     hx. brain lesions, mouth and skin ulcers  . History of seizures as a child     due to Behcet's disease - no seizures since age 14  . History of MRSA infection 2015    arms, legs  . Laceration of wrist with tendon involvement 57/32/2025    self-inflicted    Past Surgical History  Procedure Laterality Date  . Lumbar puncture  04/28/2003    under anesthesia    Family History  Problem Relation Age of Onset  . Depression Mother   . ADD / ADHD Brother   . OCD Brother   . Bipolar disorder Maternal Uncle    Social History:  reports that she has been smoking E-cigarettes.  She has been smoking about 0.00 packs per day for the past .5 years. She has never used smokeless tobacco. She reports that she drinks alcohol. She reports that she does not use illicit drugs.  Allergies:  Allergies  Allergen Reactions  . Colchicine Shortness Of Breath  . Milk-Related Compounds Other (See Comments)    GI UPSET  . Sulfa Antibiotics Hives and Swelling    SWELLING LIPS  . Adhesive [Tape] Other (See Comments)    SKIN IRRITATION  . Soap Itching    No prescriptions prior to admission    No results found for this or any previous visit (from the past 48 hour(s)).  No results found.   A comprehensive review of systems was negative except for: Integument/breast: positive for skin lesion(s) Behavioral/Psych: positive for anxiety and depression  Height 4\' 11"  (1.499 m), weight 40.824 kg (90 lb), last menstrual period 02/23/2014.  General appearance: alert, cooperative and  appears stated age Head: Normocephalic, without obvious abnormality, atraumatic Neck: supple, symmetrical, trachea midline Resp: clear to auscultation bilaterally Cardio: regular rate and rhythm GI: non tender Extremities: intact sensation and capillary refill all digits.  +epl/fpl/io/fds/fdp.  fcu not palpable.  transverse laceration proximal to wrist crease.  no other wounds. Pulses: 2+ and symmetric Skin: Skin color, texture, turgor normal. No rashes or lesions Neurologic: Grossly normal Incision/Wound: As above  Assessment/Plan Left wrist laceration with FCU laceration.  Recommend exploration with repair tendon/artery/nerve as necessary.  Risks, benefits, and alternatives of surgery were discussed and the patient agrees with the plan of care.   Jacqueline Simpson R 03/04/2014, 9:30 AM

## 2014-03-04 NOTE — Anesthesia Preprocedure Evaluation (Addendum)
Anesthesia Evaluation  Patient identified by MRN, date of birth, ID band Patient awake    Reviewed: Allergy & Precautions, H&P , NPO status , Patient's Chart, lab work & pertinent test results  Airway Mallampati: I TM Distance: >3 FB Neck ROM: Full    Dental   Pulmonary Current Smoker,          Cardiovascular     Neuro/Psych Depression    GI/Hepatic   Endo/Other    Renal/GU      Musculoskeletal   Abdominal   Peds  Hematology   Anesthesia Other Findings   Reproductive/Obstetrics                          Anesthesia Physical Anesthesia Plan  ASA: II  Anesthesia Plan: General   Post-op Pain Management:    Induction: Intravenous  Airway Management Planned: LMA  Additional Equipment:   Intra-op Plan:   Post-operative Plan: Extubation in OR  Informed Consent: I have reviewed the patients History and Physical, chart, labs and discussed the procedure including the risks, benefits and alternatives for the proposed anesthesia with the patient or authorized representative who has indicated his/her understanding and acceptance.     Plan Discussed with: CRNA and Surgeon  Anesthesia Plan Comments: (Pt has Behcet's syndrome.)       Anesthesia Quick Evaluation

## 2014-03-04 NOTE — Transfer of Care (Signed)
Immediate Anesthesia Transfer of Care Note  Patient: Jacqueline Simpson  Procedure(s) Performed: Procedure(s): LEFT WRIST EXPLORATION (Left) ARTERY AND TENDON REPAIR (Left)  Patient Location: PACU  Anesthesia Type:General  Level of Consciousness: awake, sedated and patient cooperative  Airway & Oxygen Therapy: Patient Spontanous Breathing and Patient connected to face mask oxygen  Post-op Assessment: Report given to PACU RN and Post -op Vital signs reviewed and stable  Post vital signs: Reviewed and stable  Complications: No apparent anesthesia complications

## 2014-03-05 ENCOUNTER — Encounter (HOSPITAL_BASED_OUTPATIENT_CLINIC_OR_DEPARTMENT_OTHER): Payer: Self-pay | Admitting: Orthopedic Surgery

## 2014-03-05 NOTE — Op Note (Signed)
Jacqueline, Simpson NO.:  [id number]  MEDICAL RECORD NO.:  [id number]  LOCATION:                               FACILITY:  [facility]  PHYSICIAN:  Leanora Cover, MD        DATE OF BIRTH:  [birth year]  DATE OF PROCEDURE:  03/04/2014 DATE OF DISCHARGE:  03/04/2014                              OPERATIVE REPORT   POSTOPERATIVE DIAGNOSIS:  Left wrist laceration with possible tendon artery nerve lacerations.  POSTOPERATIVE DIAGNOSES:  Left wrist laceration with flexor carpi ulnaris laceration, palmaris longus laceration, and ulnar artery laceration.  PROCEDURE:   1. Left wrist exploration wound  2. Left wrist repair of FCU tendon 3. Left wrist repair of palmaris longus tendon  4. Left wrist repair of ulnar artery under microscope.  SURGEON:  Leanora Cover, MD.  ASSISTANT:  Daryll Brod, MD.  ANESTHESIA:  General.  IV FLUIDS:  Per anesthesia flow sheet.  ESTIMATED BLOOD LOSS:  Minimal.  COMPLICATIONS:  None.  SPECIMENS:  None.  TOURNIQUET TIME:  56 minutes.  DISPOSITION:  Stable to PACU.  INDICATIONS:  Jacqueline Simpson is a 18 year old right-hand dominant female who 4 days ago suffered a laceration to the left wrist.  She was seen at [facility] Emergency Department where the wound was explored.  She is felt to have a laceration of the [place] tendon.  The wound was sutured, and she followed up in the office.  On examination, she had intact sensation and capillary refill in the fingertips.  She can flex and extend the IP joint and thumb across her fingers.  The FCU was poorly palpable.  We recommended operative exploration with repair of tendon, artery, and nerve as necessary.  Risks, benefits, and alternatives surgery were discussed including risk of blood loss, infection, damage to nerves, vessels, tendons, ligaments, bone; failure of surgery; need for additional surgery, complications with wound healing, continued pain, and weakness.  She voiced understanding of these  risks and elected to proceed.  OPERATIVE COURSE:  After being identified preoperatively by myself, the patient and I agreed upon procedure and site procedure.  Surgical site was marked.  The risks, benefits, and alternatives of surgery were reviewed and she wished to proceed.  Surgical consent had been signed. She was given IV Ancef as preoperative antibiotic prophylaxis.  She was transferred to the operating room and placed on the operative table in supine position with left upper extremity on arm board.  General anesthesia was induced by Anesthesiology.  Left upper extremity was prepped and draped in normal sterile orthopedic fashion.  Surgical pause was performed between surgeons, anesthesia, operating staff, and all were in agreement as to the patient, procedure, and site of procedure. Tourniquet at the proximal aspect of the extremity was inflated to 250 mmHg after exsanguination of the limb with Esmarch bandage.  The wound was opened.  The sutures had been removed.  It was extended both proximally and distally.  It was explored.  There was near complete laceration of the FCU tendon.  There is 50% laceration of the palmaris longus tendon.  FCR tendon was identified and was intact.  Radial artery was identified and was intact.  The median nerve was  identified and was intact as well as the palmar cutaneous branch.  The ulnar nerve was intact.  The ulnar artery had approximately 50% laceration.  It had clotted off.  The FDP and FDS tendons were outside the zone of injury, both were visualized and no wound was noted.  The wound was copiously irrigated with sterile saline.  The FCU tendon was repaired with a 4-0 FiberWire suture in a modified Kessler technique.  A 4 strand repair was achieved.  The modified Kessler technique was used on the palmaris longus tendon as well.  A 2 strand repair was achieved with the 4-0 FiberWire suture.  The microscope was brought in.  The clot was  removed from the ulnar artery.  The artery was transected to remove the damaged portion.  It was then repaired using 9-0 nylon suture in interrupted circumferential fashion.  This provided good apposition of the arterial ends.  The wound was then closed with 4-0 nylon suture in a horizontal mattress fashion.  It was injected with 8 mL of 0.25% plain Marcaine to aid in postoperative analgesia.  It was dressed in sterile Xeroform, 4x4s, and wrapped with a Kerlix bandage.  A dorsal blocking splint with the wrist in 30 degrees of flexion was placed and wrapped with Kerlix and Ace bandage.  Tourniquet was deflated at 56 minutes.  Fingertips were pink with brisk capillary refill after deflation of tourniquet. The operative drapes were broken down, and  the patient was awoken from anesthesia safely.  She was transferred back to stretcher and taken to PACU in stable condition.  I will see her back in the office in 1 week for postoperative followup.  I will give her Norco 5/325, 1-2 p.o. q.6 hours p.r.n. pain, dispensed #40 and doxycycline 100 mg p.o. b.i.d. x7 days.     Leanora Cover, MD     KK/MEDQ  D:  03/04/2014  T:  03/05/2014  Job:  [id number]

## 2014-03-25 ENCOUNTER — Encounter (HOSPITAL_COMMUNITY): Payer: Self-pay | Admitting: Psychiatry

## 2014-03-25 ENCOUNTER — Other Ambulatory Visit (HOSPITAL_COMMUNITY): Payer: 59 | Admitting: Psychiatry

## 2014-03-25 DIAGNOSIS — Z720 Tobacco use: Secondary | ICD-10-CM | POA: Insufficient documentation

## 2014-03-25 DIAGNOSIS — F329 Major depressive disorder, single episode, unspecified: Secondary | ICD-10-CM | POA: Diagnosis present

## 2014-03-25 DIAGNOSIS — F411 Generalized anxiety disorder: Secondary | ICD-10-CM | POA: Insufficient documentation

## 2014-03-25 DIAGNOSIS — F410 Panic disorder [episodic paroxysmal anxiety] without agoraphobia: Secondary | ICD-10-CM | POA: Diagnosis not present

## 2014-03-25 DIAGNOSIS — G4700 Insomnia, unspecified: Secondary | ICD-10-CM | POA: Diagnosis not present
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
## 2014-03-25 NOTE — Progress Notes (Signed)
Psychiatric Assessment Adult  Patient Identification:  Jacqueline Simpson Date of Evaluation:  03/25/2014 Chief Complaint: depression and anxiety History of Chief Complaint:   Chief Complaint  Patient presents with  . Anxiety  . Depression    Anxiety Presents for initial visit. Onset was 1 to 4 weeks ago. The problem has been gradually improving. Symptoms include depressed mood, excessive worry, hyperventilation, insomnia, malaise, nervous/anxious behavior, panic and shortness of breath. Symptoms occur occasionally. The most recent episode lasted 30 minutes. The severity of symptoms is moderate. The symptoms are aggravated by social activities and work stress. The patient sleeps 5 hours per night. The quality of sleep is fair. Nighttime awakenings: occasional.   Risk factors include a major life event. Compliance with medications is 76-100%.   Review of Systems  Respiratory: Positive for shortness of breath.   Psychiatric/Behavioral: The patient is nervous/anxious and has insomnia.    Physical Exam  Depressive Symptoms: depressed mood, anhedonia, insomnia, fatigue, difficulty concentrating, anxiety, panic attacks, insomnia, loss of energy/fatigue,  (Hypo) Manic Symptoms:   Elevated Mood:  Negative Irritable Mood:  Yes Grandiosity:  Negative Distractibility:  Negative Labiality of Mood:  Negative Delusions:  Negative Hallucinations:  Negative Impulsivity:  Negative Sexually Inappropriate Behavior:  Negative Financial Extravagance:  Negative Flight of Ideas:  Negative  Anxiety Symptoms: Excessive Worry:  Yes Panic Symptoms:  Yes Agoraphobia:  Negative Obsessive Compulsive: No  Symptoms: None, Specific Phobias:  No Social Anxiety:  Negative  Psychotic Symptoms:  Hallucinations: Negative None Delusions:  Negative Paranoia:  Negative   Ideas of Reference:  Negative  PTSD Symptoms: Ever had a traumatic exposure:  No Had a traumatic exposure in the last month:   No Re-experiencing: No None Hypervigilance:  No Hyperarousal: No None Avoidance: No None  Traumatic Brain Injury: Negative NA  Past Psychiatric History: Diagnosis: major depression recurrent moderate, generalized anxiety disorder  Hospitalizations: none  Outpatient Care: none recently  Substance Abuse Care: none  Self-Mutilation: yes  Suicidal Attempts: says the recent cuts were stress relief and not suicidal attempt  Violent Behaviors: none   Past Medical History:   Past Medical History  Diagnosis Date  . Depression   . Behcet's disease     hx. brain lesions, mouth and skin ulcers  . History of seizures as a child     due to Behcet's disease - no seizures since age 59  . History of MRSA infection 2015    arms, legs  . Laceration of wrist with tendon involvement 21/19/4174    self-inflicted  . Anxiety    History of Loss of Consciousness:  Negative Seizure History:  Yes Cardiac History:  Negative Allergies:   Allergies  Allergen Reactions  . Colchicine Shortness Of Breath  . Milk-Related Compounds Other (See Comments)    GI UPSET  . Sulfa Antibiotics Hives and Swelling    SWELLING LIPS  . Adhesive [Tape] Other (See Comments)    SKIN IRRITATION  . Soap Itching   Current Medications:  Current Outpatient Prescriptions  Medication Sig Dispense Refill  . Certolizumab Pegol (CIMZIA PREFILLED) 2 X 200 MG/ML KIT Inject 200 mg into the skin See admin instructions. Every 10 days 1 kit   . FLUoxetine (PROZAC) 10 MG capsule Take 1 capsule (10 mg total) by mouth daily. 30 capsule 0  . fluticasone (FLONASE) 50 MCG/ACT nasal spray Place 1 spray into both nostrils at bedtime.    Marland Kitchen ibuprofen (ADVIL,MOTRIN) 400 MG tablet Take 400 mg by mouth every 6 (six)  hours as needed.    . norgestimate-ethinyl estradiol (ORTHO-CYCLEN, 28,) 0.25-35 MG-MCG tablet Take 1 tablet by mouth at bedtime. 1 Package    No current facility-administered medications for this visit.    Previous  Psychotropic Medications:  Medication Dose   fluoxetine  10 mg                     Substance Abuse History in the last 12 months: Substance Age of 1st Use Last Use Amount Specific Type  Nicotine  teens   several days ago   Not every day  cigarettes  Alcohol  teens  one month ago  3-4 beers at a time  beer only  mMedical Consequences of Substance Abuse: made cuts on her arm   Legal Consequences of Substance Abuse: none  Family Consequences of Substance Abuse: lost her boyfriend and upset her parents  Blackouts:  Negative DT's:  Negative Withdrawal Symptoms:  Negative None  Social History: Current Place of Residence: [place] Birth: [place] Family Members: parents and younger brother Marital Status:  Single Children: 0  Sons: 0  Daughters: 0 Relationships: good friends and close to parents Education:  Dentist Problems/Performance: good though always very anxious in school Religious Beliefs/Practices: none reported History of Abuse: none Occupational Experiences; Military History:  None. Legal History: none Hobbies/Interests: friends  Family History:   Family History  Problem Relation Age of Onset  . Depression Mother   . ADD / ADHD Brother   . OCD Brother   . Bipolar disorder Maternal Uncle     Mental Status Examination/Evaluation: Objective:  Appearance: Casual  Eye Contact::  Good  Speech:  Clear and Coherent  Volume:  Normal  Mood:  anxious  Affect:  Appropriate  Thought Process:  Coherent and Logical  Orientation:  Full (Time, Place, and Person)  Thought Content:  Negative  Suicidal Thoughts:  No  Homicidal Thoughts:  No  Judgement:  Intact  Insight:  Fair  Psychomotor Activity:  Normal  Akathisia:  Negative  Handed:  Right  AIMS (if indicated):  0  Assets:  Communication Skills Desire for Improvement Financial Resources/Insurance Housing Leisure Time Physical Health Resilience Social  Support Talents/Skills Transportation Vocational/Educational    Laboratory/X-Ray Psychological Evaluation(s)   none  none     Treatment Plan/Recommendations:  Plan of Care: group therapy daily and continue fluoxetine  Laboratory:  none  Psychotherapy: daily group  Medications:  Fluoxetine 10 mg daily  Routine PRN Medications:  No  Consultations: none  Safety Concerns:  none  Other:      Clarene Reamer, MD 11/10/20153:01 PM

## 2014-03-26 ENCOUNTER — Other Ambulatory Visit (HOSPITAL_COMMUNITY): Payer: 59 | Admitting: Psychiatry

## 2014-03-26 DIAGNOSIS — F331 Major depressive disorder, recurrent, moderate: Secondary | ICD-10-CM

## 2014-03-26 DIAGNOSIS — F329 Major depressive disorder, single episode, unspecified: Secondary | ICD-10-CM | POA: Diagnosis not present

## 2014-03-26 DIAGNOSIS — F411 Generalized anxiety disorder: Secondary | ICD-10-CM

## 2014-03-26 NOTE — Progress Notes (Signed)
    Daily Group Progress Note  Program: IOP  Group Time: 9:00-10:30  Participation Level: Minimal  Behavioral Response: Appropriate  Type of Therapy:  Group Therapy  Summary of Progress: Pt. Reported that she was doing "ok". Pt. Reported that she was experiencing some anxiety. Pt. Was alert but quiet in group.      Group Time: 10:30-12:00  Participation Level:  Active  Behavioral Response: Appropriate  Type of Therapy: Psycho-education Group  Summary of Progress: Pt. Participated in discussion about learning to sit with emotions and moving from our "stuck" places.   Nancie Neas, LPC

## 2014-03-27 ENCOUNTER — Other Ambulatory Visit (HOSPITAL_COMMUNITY): Payer: 59 | Attending: Psychiatry | Admitting: Psychiatry

## 2014-03-27 DIAGNOSIS — F331 Major depressive disorder, recurrent, moderate: Secondary | ICD-10-CM

## 2014-03-27 DIAGNOSIS — F411 Generalized anxiety disorder: Secondary | ICD-10-CM

## 2014-03-27 DIAGNOSIS — F329 Major depressive disorder, single episode, unspecified: Secondary | ICD-10-CM | POA: Diagnosis not present

## 2014-03-28 ENCOUNTER — Other Ambulatory Visit (INDEPENDENT_AMBULATORY_CARE_PROVIDER_SITE_OTHER): Payer: 59 | Admitting: Psychiatry

## 2014-03-28 DIAGNOSIS — F331 Major depressive disorder, recurrent, moderate: Secondary | ICD-10-CM

## 2014-03-28 DIAGNOSIS — F411 Generalized anxiety disorder: Secondary | ICD-10-CM

## 2014-03-28 NOTE — Progress Notes (Signed)
    Daily Group Progress Note  Program: IOP  Group Time: 9:00-10:30  Participation Level: Active  Behavioral Response: Appropriate  Type of Therapy:  Group Therapy  Summary of Progress: Pt. Smiling more and talking more in group. Pt. Reported that she was doing "alright".     Group Time: 10:30-12:00  Participation Level:  Active  Behavioral Response: Appropriate  Type of Therapy: Psycho-education Group  Summary of Progress: Pt. Participated in discussion about changing body posture to develop assertiveness and healthy risk taking. Pt. Watched and discussed Amy Cuddy video.   Nancie Neas, LPC

## 2014-03-28 NOTE — Progress Notes (Signed)
    Daily Group Progress Note  Program: IOP  Group Time: 9:00-10:30  Participation Level: Active  Behavioral Response: Appropriate  Type of Therapy:  Group Therapy  Summary of Progress: Pt. Reported that she was feeling better. Pt. Presented with brightened mood, smiled appropriately. Pt. Reported that she completed the "5 second" challenge by writing poetry. Pt. Discussed that her mother is a significant trigger for her anxiety because she does not validate her struggle and work that she has done trying to recover from her depression.      Group Time: 10:30-12:00  Participation Level:  Active  Behavioral Response: Appropriate  Type of Therapy: Psycho-education Group  Summary of Progress: Pt. Participated in beach guided visualization.   Nancie Neas, LPC

## 2014-03-28 NOTE — Progress Notes (Signed)
    Daily Group Progress Note  Program: IOP  Group Time: 9:00-10:30  Participation Level: Active  Behavioral Response: Appropriate  Type of Therapy:  Group Therapy  Summary of Progress: Pt. Reported that she was feeling "ok". Pt. Very quiet in group, depressed with flat affect.      Group Time: 10:30-12:00  Participation Level:  Active  Behavioral Response: Appropriate  Type of Therapy: Psycho-education Group  Summary of Progress: Pt. Was alert and attentive during discussion about developing motivation. Pt. Watched Mel Robbins video.   Nancie Neas, LPC

## 2014-03-31 ENCOUNTER — Other Ambulatory Visit (HOSPITAL_COMMUNITY): Payer: 59 | Admitting: Psychiatry

## 2014-03-31 DIAGNOSIS — F329 Major depressive disorder, single episode, unspecified: Secondary | ICD-10-CM | POA: Diagnosis not present

## 2014-03-31 DIAGNOSIS — F331 Major depressive disorder, recurrent, moderate: Secondary | ICD-10-CM

## 2014-04-01 ENCOUNTER — Other Ambulatory Visit (HOSPITAL_COMMUNITY): Payer: 59 | Admitting: Psychiatry

## 2014-04-01 DIAGNOSIS — F331 Major depressive disorder, recurrent, moderate: Secondary | ICD-10-CM

## 2014-04-01 DIAGNOSIS — F329 Major depressive disorder, single episode, unspecified: Secondary | ICD-10-CM | POA: Diagnosis not present

## 2014-04-01 NOTE — Progress Notes (Signed)
    Daily Group Progress Note  Program: IOP  Group Time: 9:00-10:30  Participation Level: Active  Behavioral Response: Appropriate  Type of Therapy:  Group Therapy  Summary of Progress: Pt. Shared that she was feeling "bored" and "blah". Pt. Reported that she feels that her medication is working because she feels like getting out of bed. Pt. Continues to be challenged by social isolation and lack of motivation to engage in school or work related activities.      Group Time: 10:30-12:00  Participation Level:  Active  Behavioral Response: Appropriate  Type of Therapy: Psycho-education Group  Summary of Progress: Pt. Participated in discussion about acceptance of anxiety.  Nancie Neas, LPC

## 2014-04-01 NOTE — Progress Notes (Signed)
    Daily Group Progress Note  Program: IOP  Group Time: 9:00-10:30  Participation Level: Active  Behavioral Response: Appropriate  Type of Therapy:  Group Therapy  Summary of Progress: Pt. Reported that she was doing "alright". Pt. Reported that she had a pretty good weekend regarding her mood. Pt. Reported that she continues to feel tired and lethargic much of the time.      Group Time: 10:30-12:00  Participation Level:  Active  Behavioral Response: Appropriate  Type of Therapy: Psycho-education Group  Summary of Progress: Pt. Participated in grief and loss group.   [facility]

## 2014-04-02 ENCOUNTER — Telehealth (HOSPITAL_COMMUNITY): Payer: Self-pay | Admitting: Psychiatry

## 2014-04-02 ENCOUNTER — Other Ambulatory Visit (HOSPITAL_COMMUNITY): Payer: 59 | Admitting: Psychiatry

## 2014-04-03 ENCOUNTER — Other Ambulatory Visit (HOSPITAL_COMMUNITY): Payer: 59 | Admitting: Psychiatry

## 2014-04-03 DIAGNOSIS — F329 Major depressive disorder, single episode, unspecified: Secondary | ICD-10-CM | POA: Diagnosis not present

## 2014-04-03 DIAGNOSIS — F331 Major depressive disorder, recurrent, moderate: Secondary | ICD-10-CM

## 2014-04-03 NOTE — Progress Notes (Signed)
    Daily Group Progress Note  Program: IOP  Group Time: 9:00-10:30  Participation Level: Active  Behavioral Response: Appropriate  Type of Therapy:  Group Therapy  Summary of Progress: Pt. Reports that communication with her mother has improved. Pt. Reported that family relationships continue to be a major stressor because she feels them very judgemental. Pt. Reported that she will not attend group tomorrow because she is going to visit her boyfriend.      Group Time: 10:30-12:00  Participation Level:  Active  Behavioral Response: Appropriate  Type of Therapy: Psycho-education Group  Summary of Progress: Pt. Was present during session facilitated by the mental health association.  Nancie Neas, LPC

## 2014-04-04 ENCOUNTER — Other Ambulatory Visit (HOSPITAL_COMMUNITY): Payer: 59 | Admitting: Psychiatry

## 2014-04-07 ENCOUNTER — Other Ambulatory Visit (HOSPITAL_COMMUNITY): Payer: 59

## 2014-04-08 ENCOUNTER — Other Ambulatory Visit (HOSPITAL_COMMUNITY): Payer: 59 | Admitting: Psychiatry

## 2014-04-08 DIAGNOSIS — F329 Major depressive disorder, single episode, unspecified: Secondary | ICD-10-CM | POA: Diagnosis not present

## 2014-04-08 DIAGNOSIS — F331 Major depressive disorder, recurrent, moderate: Secondary | ICD-10-CM

## 2014-04-08 NOTE — Progress Notes (Signed)
    Daily Group Progress Note  Program: IOP  Group Time: 9:00-10:30  Participation Level: Active  Behavioral Response: Appropriate  Type of Therapy:  Group Therapy  Summary of Progress: Pt. Shared that she was doing better after spending time with her boyfriend. Pt. Shared anxiety about spending time with her family over the holidays and feeling judged by her family. Pt. Participated in session about focusing on internal strengths such as her culinary skills and creativity to provide a buffer to stress.      Group Time: 10:30-12:00  Participation Level:  Active  Behavioral Response: Appropriate  Type of Therapy: Psycho-education Group  Summary of Progress: Pt.  Was excused from second half of group.   Nancie Neas, LPC

## 2014-04-09 ENCOUNTER — Other Ambulatory Visit (HOSPITAL_COMMUNITY): Payer: 59

## 2014-04-11 ENCOUNTER — Other Ambulatory Visit (HOSPITAL_COMMUNITY): Payer: 59

## 2014-04-14 ENCOUNTER — Other Ambulatory Visit (HOSPITAL_COMMUNITY): Payer: 59 | Admitting: Psychiatry

## 2014-04-14 DIAGNOSIS — F411 Generalized anxiety disorder: Secondary | ICD-10-CM

## 2014-04-14 DIAGNOSIS — F331 Major depressive disorder, recurrent, moderate: Secondary | ICD-10-CM

## 2014-04-14 DIAGNOSIS — F329 Major depressive disorder, single episode, unspecified: Secondary | ICD-10-CM | POA: Diagnosis not present

## 2014-04-14 NOTE — Progress Notes (Signed)
    Daily Group Progress Note  Program: IOP  Group Time: 9:00-10:30  Participation Level: Minimal  Behavioral Response: Appropriate  Type of Therapy:  Group Therapy  Summary of Progress: Pt. Presented as quiet and withdrawn. Pt. Reported that she had a good Thanksgiving. Pt. Reported that she made a dish to take to the dinner and that her family enjoyed it. Pt. Reported that several family members attempted to talk to her and expressed sadness about her suicide attempt and that it was difficult for her to talk about it with her family.    Group Time: 10:30-12:00  Participation Level:  Minimal  Behavioral Response: Appropriate  Type of Therapy: Psycho-education Group  Summary of Progress: Pt. Participated in grief and loss group facilitated by Jeanella Craze.   Nancie Neas, LPC

## 2014-04-15 ENCOUNTER — Other Ambulatory Visit (HOSPITAL_COMMUNITY): Payer: 59 | Attending: Psychiatry | Admitting: Psychiatry

## 2014-04-15 DIAGNOSIS — F331 Major depressive disorder, recurrent, moderate: Secondary | ICD-10-CM | POA: Insufficient documentation

## 2014-04-15 DIAGNOSIS — F410 Panic disorder [episodic paroxysmal anxiety] without agoraphobia: Secondary | ICD-10-CM | POA: Insufficient documentation

## 2014-04-16 ENCOUNTER — Other Ambulatory Visit (HOSPITAL_COMMUNITY): Payer: 59 | Admitting: Psychiatry

## 2014-04-16 DIAGNOSIS — F410 Panic disorder [episodic paroxysmal anxiety] without agoraphobia: Secondary | ICD-10-CM | POA: Diagnosis not present

## 2014-04-16 DIAGNOSIS — F411 Generalized anxiety disorder: Secondary | ICD-10-CM

## 2014-04-16 DIAGNOSIS — F331 Major depressive disorder, recurrent, moderate: Secondary | ICD-10-CM

## 2014-04-16 NOTE — Patient Instructions (Signed)
Patient completed MH-IOP today.  Will follow up with Evlyn Kanner, LCSW on 04-17-14 @ 9 a.m and Dr. Doyne Keel on 04-24-14 @ 1:30 pm.  Encouraged support groups.

## 2014-04-16 NOTE — Progress Notes (Signed)
Jacqueline Simpson is a 18 y.o. ,single, Caucasian, female, who was referred per ED. Pt was transitioned after a self injurious event (cut to left forearm). Stated that she suffers from chronic , relatively constant low grade depression, and " I am always kind of sad". Stated that after her boyfriend did not answer her calls and she did not know where he had gone she felt acutely distressed and felt rejected. She stated she planned to just superficially scratch her forearm as a way of dealing with her feelings but " I freaked out when I saw how bad I had cut". As per ER note cut was severe and exposed muscle and subcutaneous tissue. Required 6 sutures. Patient stated she was not thinking of suicide or of dying and denies any plan to kill self. At this time states " I will never do anything like that again, it really scared me" Patient does present with some depression, and endorses neuro-vegetative symptoms of depression such as a sense of anhedonia, and low energy level, poor sleep, irritability, no motivation and tearfulness. She denied any psychotic symptoms, SI/HI. C/O of panic attacks (~ once a week). 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 States she has a math disability. Currently out on medical leave. 2) Unresolved grief/loss issues. Boyfriend of a couple of months recently ended the relationship. Also, best friend (female) has a girlfriend and doesn't come around anymore. 3) Medical Illness: Pt has had a vascular auto-immune disease since childhood. Pt completed MH-IOP today.  Reports feeling less depressed, but continues to struggle with anxiety (especially when around people).  States she is feeling more hopeful.  Reports improved appetite (eating two meals a day) and sleep.  Denies SI/HI or A/V hallucinations.  A:  D/C today.  Will follow up with Dr. Doyne Keel on 04-24-14 @ 1:30 pm and Francee Piccolo, LCSW on 04-17-14 @ 9 a.m.  Encouraged support groups and [facility].  Provided pt with [facility] contact information.  R:  Pt receptive.

## 2014-04-16 NOTE — Progress Notes (Signed)
  [facility] Discharge Summary  KEYAUNA GRAEFE [id number]  Admission date: 03/25/2014 Discharge date: 04/16/2014  Reason for admission: increasing depression and ongoing anxiety  Chemical Use History: none  Family of Origin Issues: lives with her mother and the relationship is difficult  Progress in Program Toward Treatment Goals: Much less depressed, anxiety is decreased but still prominent.  Asking about something like alprazolam that she can use as needed because she does not  Want to be on any medications she has to take daily.  Told lorazepam might be an option but to take it up with her outpatient psychiatrist.    Progress (rationale): group therapy and skills training were very helpful.  Fluoxetine also seem to be helping with the depression and anxiety.  The social anxiety has been an issue all her life.    Clarene Reamer, MD 04/16/2014

## 2014-04-16 NOTE — Progress Notes (Signed)
    Daily Group Progress Note  Program: IOP  Group Time: 9:00-10:30  Participation Level: Active  Behavioral Response: Appropriate  Type of Therapy:  Group Therapy  Summary of Progress: Pt. Smiled and talked appropriately. Pt. Prepared for discharge. Pt. Reported positive attitude about continuing with therapy and making decisions about school and career. Pt. Continues to be challenged by developing independence and autonomy from her parents.      Group Time: 10:30-12:00  Participation Level:  Active  Behavioral Response: Appropriate  Type of Therapy: Psycho-education Group  Summary of Progress: Pt. Participated in discussion about developing self-acceptance and self-compassion.   Nancie Neas, LPC

## 2014-04-17 ENCOUNTER — Ambulatory Visit (INDEPENDENT_AMBULATORY_CARE_PROVIDER_SITE_OTHER): Payer: 59 | Admitting: Clinical

## 2014-04-17 ENCOUNTER — Encounter (HOSPITAL_COMMUNITY): Payer: Self-pay | Admitting: Clinical

## 2014-04-17 ENCOUNTER — Ambulatory Visit (HOSPITAL_COMMUNITY): Payer: Self-pay | Admitting: Clinical

## 2014-04-17 ENCOUNTER — Other Ambulatory Visit (HOSPITAL_COMMUNITY): Payer: 59

## 2014-04-17 DIAGNOSIS — F339 Major depressive disorder, recurrent, unspecified: Secondary | ICD-10-CM | POA: Diagnosis not present

## 2014-04-17 NOTE — Psych (Signed)
Patient:   Jacqueline Simpson   DOB:   [birth year]  MR Number:  [id number]  Location:  [facility] [id number] [place] [address] Dept: [phone]           Date of Service:   04/17/2014  Start Time:   3:10 End Time:   4:10  Provider/Observer:  Jerel Shepherd Counselor       Billing Code/Service: [id number]  Behavioral Observation: Jacqueline Simpson  presents as a 18 y.o.-year-old {Caucasian  Female who appeared her stated age. her dress was appropriate and she was Neat and her manners were Appropriate to the situation.  There were not any physical disabilities noted.  she displayed an appropriate level of cooperation and motivation.    Interactions:    Active   Attention:   normal  Memory:   normal  Speech (Volume):  normal  Speech:   normal pitch  Thought Process:  Coherent  Though Content:  WNL  Orientation:   person, place, time/date and situation  Judgment:   Fair  Planning:   Fair  Affect:    Appropriate  Mood:    NA  Insight:   Fair  Intelligence:   high  Chief Complaint:     Chief Complaint  Patient presents with  . Depression  . Anxiety    Reason for Service:  On and off since 13.  Attempted suicide 5-6 weeks ago. Cutting your wrist. Had panic attack and  History of cutting.                                                   Depression and anxiety was bigger than I shared.   Current Symptoms:  Depression and Anxiety. Prozac is helping - starting to enjoy things.     Source of Distress:             Continue care for depression - the extent of the depression.  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  Education:   [employer] 1st year - psychology - currently on medical leave  Legal History:  N/A  Nature conservation officer Experience:  N/A   Religious/Spiritual Preferences:     None   Family/Childhood History:                         Born [place], [place]   Until was [age]. I go back to visit. [place]  With both parents and brother, It was happy but I had  A lot of medical issues, lots of  Test.  Diagnosed around age 17 with auto immune disease. Currently live with p[arents and brother.   Natural/Informal Support:                           Family, friends   Substance Use:  No concerns of substance abuse are reported.     Medical History:   Past Medical History  Diagnosis Date  . Depression   . Behcet's disease     hx. brain lesions, mouth and skin ulcers  . History of seizures as a child     due to Behcet's disease - no seizures since age 50  . History of MRSA  infection 2015    arms, legs  . Laceration of wrist with tendon involvement 47/01/6282    self-inflicted  . Anxiety           Medication List       This list is accurate as of: 04/17/14  3:17 PM.  Always use your most recent med list.               Certolizumab Pegol 2 X 200 MG/ML Kit  Commonly known as:  CIMZIA PREFILLED  Inject 200 mg into the skin See admin instructions. Every 10 days     FLUoxetine 10 MG capsule  Commonly known as:  PROZAC  Take 1 capsule (10 mg total) by mouth daily.     fluticasone 50 MCG/ACT nasal spray  Commonly known as:  FLONASE  Place 1 spray into both nostrils at bedtime.     ibuprofen 400 MG tablet  Commonly known as:  ADVIL,MOTRIN  Take 400 mg by mouth every 6 (six) hours as needed.     norgestimate-ethinyl estradiol 0.25-35 MG-MCG tablet  Commonly known as:  ORTHO-CYCLEN (28)  Take 1 tablet by mouth at bedtime.              Sexual History:   History  Sexual Activity  . Sexual Activity:  . Partners: Female, Female     Abuse/Trauma History: Trauma - Medical issues and recent hospitialzation    Psychiatric History:  Started therapy on and off since age 65 for depression. Didn't  like it                                                   Hospitalization for suicide  attempt    Strengths:   Loving, open good listener and comforting, creativity  - cook and draw    Recovery Goals:  " I would like to be able to talk about everything, and feeling better all around. Learning to cope and deal with everything better."  Hobbies/Interests:               Cooking , drawing, knitting, crocheting, sims    Challenges/Barriers: "I want to get better really badly so I would do anything to get there."    Family Med/Psych History:  Family History  Problem Relation Age of Onset  . Depression Mother   . ADD / ADHD Brother   . OCD Brother   . Bipolar disorder Maternal Uncle     Risk of Suicide/Violence: high  Recent attempt 6 weeks ago. Denies any current suicidal or homicidal ideation.  History of Suicide/Violence:  Wasn't really an attempt was trying to cut self to deal with anxiety. Had cut  Self in past.   Psychosis:   None  Diagnosis:    Major depressive disorder, recurrent episode with anxious distress  Impression/DX:  Jacqueline Simpson reports first being treated with depression at age 39. She stated that she had been in therapy off and on since then. Jacqueline Simpson shared that she had recently been hospitalized for a a suicide attempt (5-6 weeks prior). Jacqueline Simpson said that she was not trying to die, but that instead she was cutting herself to relieve her anxiety(was having a panic attack) and that it accidentally went to far. Jacqueline Simpson said that prior to taking Prozac (few weeks ago) her depression made it impossible for her to enjoy the  things she used to. She shared that it was difficult to get out of bed and do daily tasks. Jacqueline Simpson shared that it was difficult for her to express to others the depth of her anxiety and depression. Jacqueline Simpson shared that she is often very anxious along with the anxiety.  Recommendation/Plan: Individual session 1x a week, follow safety plan if needed.

## 2014-04-18 ENCOUNTER — Other Ambulatory Visit (HOSPITAL_COMMUNITY): Payer: 59

## 2014-04-21 ENCOUNTER — Other Ambulatory Visit (HOSPITAL_COMMUNITY): Payer: 59

## 2014-04-22 ENCOUNTER — Other Ambulatory Visit (HOSPITAL_COMMUNITY): Payer: 59

## 2014-04-23 ENCOUNTER — Other Ambulatory Visit (HOSPITAL_COMMUNITY): Payer: 59

## 2014-04-24 ENCOUNTER — Ambulatory Visit (HOSPITAL_COMMUNITY): Payer: Self-pay | Admitting: Psychiatry

## 2014-04-24 ENCOUNTER — Other Ambulatory Visit (HOSPITAL_COMMUNITY): Payer: 59

## 2014-04-25 ENCOUNTER — Ambulatory Visit (INDEPENDENT_AMBULATORY_CARE_PROVIDER_SITE_OTHER): Payer: 59 | Admitting: Clinical

## 2014-04-25 ENCOUNTER — Encounter (HOSPITAL_COMMUNITY): Payer: Self-pay | Admitting: Clinical

## 2014-04-25 ENCOUNTER — Other Ambulatory Visit (HOSPITAL_COMMUNITY): Payer: 59

## 2014-04-25 DIAGNOSIS — F339 Major depressive disorder, recurrent, unspecified: Secondary | ICD-10-CM | POA: Diagnosis not present

## 2014-04-25 NOTE — Progress Notes (Signed)
THERAPIST PROGRESS NOTE  Session Time: 2:05 - 3:00  Participation Level: Active  Behavioral Response: NeatAlertAnxious and Depressed  Type of Therapy: Individual Therapy  Treatment Goals addressed: Anxiety and depression  Interventions: CBT and Other: Grounding techniques  Summary: Jacqueline Simpson is a 18 y.o. female who presents with anxiety and depression.   Suicidal/Homicidal: Nowithout intent/plan  Therapist Response:  Ebony Hail met with clinician for an individual session. [name] discussed her week's events and her symptoms. Client and clinician discussed grounding techniques and practiced one together. [name] shared her experience with the process. Clinician introduced cbt. Ebony Hail and clinician discussed how our thoughts affect our emotions and our experiences. Clinician introduced a 7 panel  thought record sheet. Client and clinician worked a work Pension scheme manager. Client and clinician discussed [name]' negative automatic thought, the evidence that did and did not support the thoughts, and [name] formulated a healthier alternative thought. Ebony Hail and clinician discussed the process and challenges of the process. [name] reported that her distressing emotions related to the negative automatic thought lessened substantially after the process. [name] agreed to complete some grounding and cbt homework before next session. [name] denied any current suicidal or homicidal ideation.     Plan: Return again in 1 week.  Diagnosis: Axis I:    CD-9-CM: 296.30 ICD-10-CM: F33.9 Major depressive disorder, recurrent episode with anxious distress      Greidy Sherard A, LCSW 04/25/2014

## 2014-04-28 ENCOUNTER — Other Ambulatory Visit (HOSPITAL_COMMUNITY): Payer: 59

## 2014-04-29 ENCOUNTER — Other Ambulatory Visit (HOSPITAL_COMMUNITY): Payer: 59

## 2014-04-30 ENCOUNTER — Other Ambulatory Visit (HOSPITAL_COMMUNITY): Payer: 59

## 2014-05-01 ENCOUNTER — Other Ambulatory Visit (HOSPITAL_COMMUNITY): Payer: 59

## 2014-05-02 ENCOUNTER — Other Ambulatory Visit (HOSPITAL_COMMUNITY): Payer: 59

## 2014-05-05 ENCOUNTER — Other Ambulatory Visit (HOSPITAL_COMMUNITY): Payer: 59

## 2014-05-08 ENCOUNTER — Other Ambulatory Visit (HOSPITAL_COMMUNITY): Payer: Self-pay | Admitting: *Deleted

## 2014-05-08 NOTE — Telephone Encounter (Signed)
Dr. Beckie Salts states he refilled Fluoxetine with 1 additional refill by fax on 05/07/14. Confirmation received.

## 2014-05-15 ENCOUNTER — Ambulatory Visit (INDEPENDENT_AMBULATORY_CARE_PROVIDER_SITE_OTHER): Payer: 59 | Admitting: Psychiatry

## 2014-05-15 ENCOUNTER — Encounter (HOSPITAL_COMMUNITY): Payer: Self-pay | Admitting: Psychiatry

## 2014-05-15 VITALS — BP 122/86 | HR 118 | Ht 59.01 in | Wt 95.8 lb

## 2014-05-15 DIAGNOSIS — F331 Major depressive disorder, recurrent, moderate: Secondary | ICD-10-CM

## 2014-05-15 DIAGNOSIS — F4010 Social phobia, unspecified: Secondary | ICD-10-CM

## 2014-05-15 MED ORDER — HYDROXYZINE PAMOATE 25 MG PO CAPS: 25.0000 mg | ORAL_CAPSULE | Freq: Two times a day (BID) | ORAL | Status: DC | PRN

## 2014-05-15 MED ORDER — FLUOXETINE HCL 20 MG PO CAPS: 20.0000 mg | ORAL_CAPSULE | Freq: Every day | ORAL | Status: DC

## 2014-05-15 NOTE — Progress Notes (Signed)
[facility] [phone] Progress Note  Jacqueline Simpson [id number] 18 y.o.  05/15/2014 9:41 AM  Chief Complaint: overall pretty good  History of Present Illness: Pt was in the IOP at [facility] from 11/10-12/06/2013 due to depression and after a suicide attempt. States it was a good experience and learned some coping skills.  Living at home with parents and brother. Things are a little tense with mom.  Depression is significantly improved. Anhedonia is improving and she is now doing some fun things. Denies crying spells, hopelessness and worthlessness. She feels depressed a few times a month and feels "blah". Sleep is poor and she doesn't sleep until 5am but will sleep for 6 hrs. She tried Trazodone and it made her nose stuffy. Energy, appetite and concentration are good.   Isolation continues due to anxiety. She only goes out with one friend and her boyfriend. When in group she doesn't talk and feels it hold her back socially and professionally. In IOP she didn't begin talking until the last few days before discharge. Worries that if she talks people will think she is weird.   Pt is in therapy but states coping skills are not working.  Pt is taking Prozac as prescribed and denies SE.   Suicidal Ideation: No Plan Formed: No Patient has means to carry out plan: No  Homicidal Ideation: No Plan Formed: No Patient has means to carry out plan: No  Review of Systems: Psychiatric: Agitation: Yes when she gets mad she snaps at people.  Hallucination: No Depressed Mood: Yes Insomnia: Yes Hypersomnia: No Altered Concentration: No Feels Worthless: No Grandiose Ideas: No Belief In Special Powers: No New/Increased Substance Abuse: No Compulsions: No  Neurologic: Headache: No Seizure: No Paresthesias: No  Review of Systems  Constitutional: Negative for fever and chills.  HENT: Negative for congestion and sore throat.   Eyes: Negative for blurred vision, double vision and redness.   Respiratory: Negative for cough, sputum production and shortness of breath.   Cardiovascular: Negative for chest pain, palpitations and leg swelling.  Gastrointestinal: Positive for heartburn. Negative for nausea, vomiting and abdominal pain.  Musculoskeletal: Negative for myalgias, back pain and neck pain.  Skin: Negative for itching and rash.  Neurological: Negative for dizziness, tingling, seizures, weakness and headaches.  Psychiatric/Behavioral: Positive for depression. Negative for suicidal ideas, hallucinations and substance abuse. The patient is nervous/anxious and has insomnia.       Past Medical Family, Social History: living at home with parents and brother. Unemployed.  reports that she has been smoking E-cigarettes.  She has been smoking about 0.00 packs per day for the past .5 years. She has never used smokeless tobacco. She reports that she drinks alcohol. She reports that she does not use illicit drugs.  Family History  Problem Relation Age of Onset  . Depression Mother   . ADD / ADHD Brother   . OCD Brother   . Bipolar disorder Maternal Uncle    Past Medical History  Diagnosis Date  . Depression   . Behcet's disease     hx. brain lesions, mouth and skin ulcers  . History of seizures as a child     due to Behcet's disease - no seizures since age 51  . History of MRSA infection 2015    arms, legs  . Laceration of wrist with tendon involvement 38/02/1750    self-inflicted  . Anxiety    Outpatient Encounter Prescriptions as of 05/15/2014  Medication Sig  . Certolizumab Pegol (CIMZIA PREFILLED)  2 X 200 MG/ML KIT Inject 200 mg into the skin See admin instructions. Every 10 days  . FLUoxetine (PROZAC) 10 MG capsule Take 1 capsule (10 mg total) by mouth daily.  . fluticasone (FLONASE) 50 MCG/ACT nasal spray Place 1 spray into both nostrils at bedtime.  Marland Kitchen ibuprofen (ADVIL,MOTRIN) 400 MG tablet Take 400 mg by mouth every 6 (six) hours as needed.  . norgestimate-ethinyl  estradiol (ORTHO-CYCLEN, 28,) 0.25-35 MG-MCG tablet Take 1 tablet by mouth at bedtime.    Past Psychiatric History/Hospitalization(s): Anxiety: Yes Bipolar Disorder: No Depression: Yes Mania: No Psychosis: No Schizophrenia: No Personality Disorder: No Hospitalization for psychiatric illness: Yes History of Electroconvulsive Shock Therapy: No Prior Suicide Attempts: Yes  Physical Exam: Constitutional:  BP 122/86 mmHg  Pulse 118  Ht 4' 11.01" (1.499 m)  Wt 95 lb 12.8 oz (43.455 kg)  BMI 19.34 kg/m2  General Appearance: alert, oriented, no acute distress  Musculoskeletal: Strength & Muscle Tone: within normal limits Gait & Station: normal Patient leans: N/A  Mental Status Examination/Evaluation: Objective: Attitude: Calm and cooperative  Appearance: Casual and Fairly Groomed, appears to be stated age, wearing nose ring  Eye Contact::  Good  Speech:  Clear and Coherent and Normal Rate  Volume:  Normal  Mood:  anxious  Affect:  Congruent  Thought Process:  Goal Directed  Orientation:  Full (Time, Place, and Person)  Thought Content:  Negative  Suicidal Thoughts:  No  Homicidal Thoughts:  No  Judgement:  Fair  Insight:  Fair  Concentration: good  Memory: Immediate-good Recent-good Remote-good  Recall: fair  Language: fair  Gait and Station: normal  [employer] of Knowledge: average  Psychomotor Activity:  Normal  Akathisia:  No  Handed:  Right  AIMS (if indicated): n/a  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  Depression   . Behcet's disease     hx. brain lesions, mouth and skin ulcers  . History of seizures as a child     due to Behcet's disease - no seizures since age 54  . History of MRSA infection 2015    arms, legs  . Laceration of wrist with tendon involvement 02/54/2706    self-inflicted  . Anxiety    Axis IV: limited coping skills  Axis V: GAF 51-60   Plan: Increase Prozac to 18m po qD for mood and anxiety Start trial of Vistaril 266mpo BID prn anxiety and sleep May consider augmenting with Remeron  Medication management with supportive therapy. Risks/benefits and SE of the medication discussed. Pt verbalized understanding and verbal consent obtained for treatment.  Affirm with the patient that the medications are taken as ordered. Patient expressed understanding of how their medications were to be used.  -worsening of anxiety symptoms, improvement of depression symptoms  Therapy: brief supportive therapy provided. Discussed psychosocial stressors in detail.   Encouraged to continue individual therapy with JeAnderson MaltaPt denies SI and is at an acute low risk for suicide.Patient told to call clinic if any problems occur. Patient advised to go to ER if they should develop SI/HI, side effects, or if symptoms  worsen. Has crisis numbers to call if needed. Pt verbalized understanding.  F/up in 2 months or sooner if needed  Charlcie Cradle, MD 05/15/2014

## 2014-05-19 ENCOUNTER — Ambulatory Visit (HOSPITAL_COMMUNITY): Payer: Self-pay | Admitting: Psychiatry

## 2014-06-16 ENCOUNTER — Telehealth (HOSPITAL_COMMUNITY): Payer: Self-pay

## 2014-06-17 NOTE — Telephone Encounter (Signed)
Called and left message for call back.

## 2014-06-19 ENCOUNTER — Telehealth (HOSPITAL_COMMUNITY): Payer: Self-pay

## 2014-06-19 DIAGNOSIS — F4010 Social phobia, unspecified: Secondary | ICD-10-CM

## 2014-06-19 MED ORDER — PROPRANOLOL HCL 10 MG PO TABS: 10.0000 mg | ORAL_TABLET | Freq: Two times a day (BID) | ORAL | Status: DC | PRN

## 2014-06-19 NOTE — Telephone Encounter (Signed)
Pt states anxiety meds are not working. She has been taking it but it only makes her sleepy. Pt has started taking it at night only. Asking for Xanax because states she needs anxiety relief.  Increasing Prozac also made her sleepy so she went back down to 10mg .  A/P: MDD, Social Anxiety disorder 1. Declined treatment with Remeron 2. D/c Vistaril 3. Start trial of Propanolol 10mg  po BID prn anxiety 4. Increase the Prozac back to 20mg  po qD 5. Refused pt's request for Xanax

## 2014-06-19 NOTE — Telephone Encounter (Signed)
Called and left voice message for call back

## 2014-06-26 ENCOUNTER — Ambulatory Visit (INDEPENDENT_AMBULATORY_CARE_PROVIDER_SITE_OTHER): Payer: 59 | Admitting: Psychiatry

## 2014-06-26 DIAGNOSIS — F331 Major depressive disorder, recurrent, moderate: Secondary | ICD-10-CM

## 2014-06-26 DIAGNOSIS — F4010 Social phobia, unspecified: Secondary | ICD-10-CM

## 2014-06-27 ENCOUNTER — Ambulatory Visit (HOSPITAL_COMMUNITY): Payer: Self-pay | Admitting: Psychiatry

## 2014-06-27 NOTE — Progress Notes (Signed)
   THERAPIST PROGRESS NOTE  Session Time: 2:05 - 3:00  Participation Level: Active  Behavioral Response: NeatAlertAnxious and Depressed  Type of Therapy: Individual Therapy  Treatment Goals addressed: Anxiety and depression  Interventions: CBT   Summary: Jacqueline Simpson is a 19 y.o. female who presents with anxiety and depression.   Suicidal/Homicidal: Nowithout intent/plan  Therapist Response: Pt.'s first session since discharge from [facility]. Pt. Reports significant improvement in her mood as evidenced by having more energy, greater ability to concentrate on daily tasks and self-care. Pt. Reports that she has decided not to return to school at this time,but that she has found a job at [employer] that she believes will be a good fit. Pt. Reports that her boyfriend broke up with her abruptly and that she was sad for a while but that she was not devastated or internalize as a rejection of her. Pt. Reports that she has started dating again and is excited about a new person that she is dating. Pt. Reports that communication with her mother and father have improved, she feels supported by her parents and that they have family meetings which has helped with their understanding of her depression and how they can be appropriately supportive. Pt. Reports that she consistently sleeps 9-10 hours a day which is sometimes of concern to her parents and at times she has to reassure them that she is not depressed but tired. Pt. Was able to verbalize her personal "red flags" including lack of energy and ability to concentrate and "things get dark" as indicators of her worsening depression.   Plan: Continue with CBT based therapy. Return again in 2 weeks.  Diagnosis:Axis I: CD-9-CM: 296.30 ICD-10-CM: F33.9 Major depressive disorder, recurrent episode with anxious distress   Nancie Neas, [facility] 06/27/2014

## 2014-07-07 ENCOUNTER — Telehealth (HOSPITAL_COMMUNITY): Payer: Self-pay

## 2014-07-07 NOTE — Telephone Encounter (Signed)
Telephone call with patient after her Mother left a message over the weekend concerned patient too sedated taking Trazadone and Vistaril together.  Discussed with patient she is not currently prescribed Trazodone and has not been in the past by Dr. Doyne Keel and was also discontinued on Vistaril from 06/19/14 telephone discussion patient had with Dr. Doyne Keel.  Patient admitted she had taken a trazodone pill she had gotten in the past from another provider and had taken it with a Vistaril that was no longer being prescribed to help sleep and for anxiety.  Patient reported one time event of sleeping until 5pm and now not taking either.  Patient reported Propranolol not helping as much as she would like for anxiety but will discuss with Dr. Doyne Keel at evaluation set for 07/10/14.  Patient denied any current problems or concerns.   No suicidal or homicidal ideations and will see Dr. Doyne Keel 07/10/14.  Patient will need to sign a consent for her Mother to have access to records if desires her to receive call backs when requested.

## 2014-07-10 ENCOUNTER — Ambulatory Visit (INDEPENDENT_AMBULATORY_CARE_PROVIDER_SITE_OTHER): Payer: 59 | Admitting: Psychiatry

## 2014-07-10 ENCOUNTER — Encounter (HOSPITAL_COMMUNITY): Payer: Self-pay | Admitting: Psychiatry

## 2014-07-10 VITALS — BP 113/76 | HR 97 | Ht 59.0 in | Wt 94.0 lb

## 2014-07-10 DIAGNOSIS — F4010 Social phobia, unspecified: Secondary | ICD-10-CM

## 2014-07-10 DIAGNOSIS — F331 Major depressive disorder, recurrent, moderate: Secondary | ICD-10-CM

## 2014-07-10 MED ORDER — FLUOXETINE HCL 20 MG PO CAPS: 40.0000 mg | ORAL_CAPSULE | Freq: Every day | ORAL | Status: DC

## 2014-07-10 MED ORDER — HYDROXYZINE HCL 25 MG PO TABS: 25.0000 mg | ORAL_TABLET | Freq: Two times a day (BID) | ORAL | Status: DC | PRN

## 2014-07-10 MED ORDER — PROPRANOLOL HCL 10 MG PO TABS: 10.0000 mg | ORAL_TABLET | Freq: Two times a day (BID) | ORAL | Status: DC | PRN

## 2014-07-10 NOTE — Progress Notes (Signed)
[facility] [phone] Progress Note  Jacqueline Simpson [id number] 19 y.o.  07/10/2014 2:36 PM  Chief Complaint: overall pretty good  History of Present Illness: Pt states she feels better. Anxiety is decreased and she is feeling confident. Pt taking Propanolol about 1-2x/week and it helps to decrease her anxiety at the time.   No longer isolating herself. Pt has a job Catering manager at retail location. Pt thinks she can handle the social interaction. Pt has been going out more and has made more friends. Pt goal is live on her own. Things are good at home.   Pt took old Trazodone and Vistaril once together and slept for over 12 hrs. She doesn't plan on doing it again. Pt takes Vistaril at bedtime and it helps her sleep.   Depression is significantly improved and even less frequent. Anhedonia is improving and she is now doing some fun things. Denies crying spells, hopelessness and worthlessness. She feels depressed a few rare times a month and feels "blah". Energy, appetite and concentration are good.   Pt is in therapy but states coping skills are not working.  Pt is taking Prozac as prescribed and denies SE.   Suicidal Ideation: No Plan Formed: No Patient has means to carry out plan: No  Homicidal Ideation: No Plan Formed: No Patient has means to carry out plan: No  Review of Systems: Psychiatric: Agitation: No Hallucination: No Depressed Mood: No Insomnia: No Hypersomnia: No Altered Concentration: No Feels Worthless: No Grandiose Ideas: No Belief In Special Powers: No New/Increased Substance Abuse: No Compulsions: No  Neurologic: Headache: No Seizure: No Paresthesias: No  Review of Systems  Constitutional: Negative for fever, chills and weight loss.  HENT: Negative for congestion, nosebleeds and sore throat.   Eyes: Negative for blurred vision, double vision and redness.  Respiratory: Negative for cough, shortness of breath and wheezing.    Cardiovascular: Negative for chest pain, palpitations and leg swelling.  Gastrointestinal: Negative for heartburn, nausea, vomiting and abdominal pain.  Musculoskeletal: Negative for back pain, joint pain and neck pain.  Skin: Negative for itching and rash.  Neurological: Negative for dizziness, sensory change, seizures, loss of consciousness, weakness and headaches.  Psychiatric/Behavioral: Negative for depression, suicidal ideas, hallucinations and substance abuse. The patient is not nervous/anxious and does not have insomnia.       Past Medical Family, Social History: living at home with parents and brother. Unemployed.  reports that she has been smoking E-cigarettes.  She has smoked for the past .5 years. She has never used smokeless tobacco. She reports that she drinks alcohol. She reports that she does not use illicit drugs.  Family History  Problem Relation Age of Onset  . Depression Mother   . ADD / ADHD Brother   . OCD Brother   . Bipolar disorder Maternal Uncle    Past Medical History  Diagnosis Date  . Depression   . Behcet's disease     hx. brain lesions, mouth and skin ulcers  . History of seizures as a child     due to Behcet's disease - no seizures since age 65  . History of MRSA infection 2015    arms, legs  . Laceration of wrist with tendon involvement 97/06/6376    self-inflicted  . Anxiety    Outpatient Encounter Prescriptions as of 07/10/2014  Medication Sig  . Certolizumab Pegol (CIMZIA PREFILLED) 2 X 200 MG/ML KIT Inject 200 mg into the skin See admin instructions. Every 10 days  .  FLUoxetine (PROZAC) 20 MG capsule Take 1 capsule (20 mg total) by mouth daily.  . fluticasone (FLONASE) 50 MCG/ACT nasal spray Place 1 spray into both nostrils at bedtime.  . hydrOXYzine (ATARAX/VISTARIL) 25 MG tablet Take 25 mg by mouth 2 (two) times daily as needed.  Marland Kitchen ibuprofen (ADVIL,MOTRIN) 400 MG tablet Take 400 mg by mouth every 6 (six) hours as needed.  .  norgestimate-ethinyl estradiol (ORTHO-CYCLEN, 28,) 0.25-35 MG-MCG tablet Take 1 tablet by mouth at bedtime.  . propranolol (INDERAL) 10 MG tablet Take 1 tablet (10 mg total) by mouth 2 (two) times daily as needed (anxiety).    Past Psychiatric History/Hospitalization(s): Anxiety: Yes Bipolar Disorder: No Depression: Yes Mania: No Psychosis: No Schizophrenia: No Personality Disorder: No Hospitalization for psychiatric illness: Yes History of Electroconvulsive Shock Therapy: No Prior Suicide Attempts: Yes  Physical Exam: Constitutional:  BP 113/76 mmHg  Pulse 97  Ht 4' 11"  (1.499 m)  Wt 94 lb (42.638 kg)  BMI 18.98 kg/m2  General Appearance: alert, oriented, no acute distress  Musculoskeletal: Strength & Muscle Tone: within normal limits Gait & Station: normal Patient leans: N/A  Mental Status Examination/Evaluation: Objective: Attitude: Calm and cooperative  Appearance: Casual and Fairly Groomed, appears to be stated age, wearing nose ring  Eye Contact::  Good  Speech:  Clear and Coherent and Normal Rate  Volume:  Normal  Mood:  euthymic  Affect:  Congruent  Thought Process:  Goal Directed  Orientation:  Full (Time, Place, and Person)  Thought Content:  Negative  Suicidal Thoughts:  No  Homicidal Thoughts:  No  Judgement:  Fair  Insight:  Fair  Concentration: good  Memory: Immediate-good Recent-good Remote-good  Recall: fair  Language: fair  Gait and Station: normal  [employer] of Knowledge: average  Psychomotor Activity:  Normal  Akathisia:  No  Handed:  Right  AIMS (if indicated): n/a  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 Depression   . Behcet's disease     hx. brain lesions, mouth and skin ulcers  . History of seizures as a child     due to Behcet's disease - no seizures since age 18  . History of MRSA infection 2015    arms, legs  . Laceration of wrist with tendon involvement 77/82/4235    self-inflicted  . Anxiety    Axis IV: limited coping skills  Axis V: GAF 51-60   Plan: Increase Prozac to 11m po qD for mood and anxiety. Increase may help with anxiety Vistaril 257mpo BID prn anxiety and sleep Propanolol 1078mo BID prn anxiety  Medication management with supportive therapy. Risks/benefits and SE of the medication discussed. Pt verbalized understanding and verbal consent obtained for treatment.  Affirm with the patient that the medications are taken as ordered. Patient expressed understanding of how their medications were to be used.  -improvement of depression and anxiety symptoms  Therapy: brief supportive therapy provided. Discussed psychosocial stressors in detail.   Encouraged to continue individual therapy with JenAnderson Maltat denies SI and is at an acute low risk for suicide.Patient told to call clinic if any problems occur.  Patient advised to go to ER if they should develop SI/HI, side effects, or if symptoms worsen. Has crisis numbers to call if needed. Pt verbalized understanding.  F/up in 3 months or sooner if needed  Charlcie Cradle, MD 07/10/2014

## 2014-07-16 ENCOUNTER — Other Ambulatory Visit (HOSPITAL_COMMUNITY): Payer: Self-pay | Admitting: Psychiatry

## 2014-08-04 ENCOUNTER — Ambulatory Visit (INDEPENDENT_AMBULATORY_CARE_PROVIDER_SITE_OTHER): Payer: 59 | Admitting: Psychiatry

## 2014-08-04 DIAGNOSIS — F4010 Social phobia, unspecified: Secondary | ICD-10-CM

## 2014-08-05 NOTE — Progress Notes (Signed)
   THERAPIST PROGRESS NOTE  Session Time: 2:00 - 3:00  Participation Level: Active  Behavioral Response: NeatAlertEuthymic  Type of Therapy: Individual Therapy  Treatment Goals addressed: Anxiety and depression  Interventions: CBT   Summary: Jacqueline Simpson is a 19 y.o. female who presents with anxiety and depression.   Suicidal/Homicidal: Nowithout intent/plan  Therapist Response: Pt. Presents with euthymic mood, talkative, makes appropriate eye contact. Pt. Reports that she is in a new relationship that she believes is healthy and that her partner respects her personal boundaries. Pt. Reports [place] boyfriend and friends during [place] weekend. Pt. Reports that she has goal of employment, moving out of her parent's home, and eveloping more independence. Pt. Reports that her parents remain supportive. Pt. Reports that she did not get job with [employer], but continues to be hopeful that she will find another job. Pt. Reports that she has been managing anxiety well an has to use prn anxiety medication infrequently. Reviewed instruction for meditation exercise. Pt. Reports that she has not used meditation daily, but she is able to use when she feels anxious.  Plan: Continue with CBT based therapy. Return again in 2 weeks.  Diagnosis:Axis I: CD-9-CM: 296.30 ICD-10-CM: F33.9 Major depressive disorder, recurrent episode with anxious distress   Nancie Neas, [facility] 08/05/2014

## 2014-08-11 ENCOUNTER — Ambulatory Visit (HOSPITAL_COMMUNITY): Payer: Self-pay | Admitting: Psychiatry

## 2014-08-22 ENCOUNTER — Ambulatory Visit (INDEPENDENT_AMBULATORY_CARE_PROVIDER_SITE_OTHER): Payer: 59 | Admitting: Psychiatry

## 2014-08-22 DIAGNOSIS — F331 Major depressive disorder, recurrent, moderate: Secondary | ICD-10-CM

## 2014-08-22 DIAGNOSIS — F4010 Social phobia, unspecified: Secondary | ICD-10-CM

## 2014-08-22 NOTE — Progress Notes (Signed)
   THERAPIST PROGRESS NOTE  Session Time: 2:00 - 3:00  Participation Level: Active  Behavioral Response: NeatAlertEuthymic  Type of Therapy: Individual Therapy  Treatment Goals addressed: Anxiety and depression  Interventions: CBT   Summary: Jacqueline Simpson is a 19 y.o. female who presents with anxiety and depression.   Suicidal/Homicidal: Nowithout intent/plan  Therapist Response: Pt. Continues to present with euthymic mood. Pt. Reports that her mood has been good for the past few weeks with minimal anxiety. Pt. Reports that she continues in relationship with boyfriend and that she is very happy with him. Pt. Reports that her mother has been concerned because her best friend is not talking to her and problems with him have been triggers for depressive episodes in the future. Pt. Reports that she has not been stressed currently by the relationship. Pt. Reports that she continues to look for a job because she desires more independence from her parents and is open to fast food or retail jobs. Pt. Reports plans to go camping with her boyfriend this weekend. Pt. Participated in self-acceptance guided meditation to develop relaxation as a coping skill.  Plan: Continue with CBT based therapy. Return again in 2 weeks.  Diagnosis:Axis I: CD-9-CM: 296.30 ICD-10-CM: F33.9 Major depressive disorder, recurrent episode with anxious distress   Nancie Neas, [facility] 08/22/2014

## 2014-08-25 ENCOUNTER — Ambulatory Visit (HOSPITAL_COMMUNITY): Payer: Self-pay | Admitting: Psychiatry

## 2014-09-01 ENCOUNTER — Ambulatory Visit (HOSPITAL_COMMUNITY): Payer: Self-pay | Admitting: Psychiatry

## 2014-09-15 ENCOUNTER — Ambulatory Visit (HOSPITAL_COMMUNITY): Payer: Self-pay | Admitting: Psychiatry

## 2014-10-09 ENCOUNTER — Encounter (HOSPITAL_COMMUNITY): Payer: Self-pay | Admitting: Psychiatry

## 2014-10-09 ENCOUNTER — Ambulatory Visit (INDEPENDENT_AMBULATORY_CARE_PROVIDER_SITE_OTHER): Payer: 59 | Admitting: Psychiatry

## 2014-10-09 VITALS — BP 111/74 | HR 87 | Ht 59.0 in | Wt 94.4 lb

## 2014-10-09 DIAGNOSIS — F4010 Social phobia, unspecified: Secondary | ICD-10-CM | POA: Diagnosis not present

## 2014-10-09 DIAGNOSIS — F331 Major depressive disorder, recurrent, moderate: Secondary | ICD-10-CM | POA: Diagnosis not present

## 2014-10-09 MED ORDER — FLUOXETINE HCL 20 MG PO CAPS: 20.0000 mg | ORAL_CAPSULE | Freq: Every day | ORAL | Status: DC

## 2014-10-09 MED ORDER — PROPRANOLOL HCL 10 MG PO TABS: 10.0000 mg | ORAL_TABLET | Freq: Two times a day (BID) | ORAL | Status: DC | PRN

## 2014-10-09 MED ORDER — HYDROXYZINE HCL 25 MG PO TABS: 25.0000 mg | ORAL_TABLET | Freq: Two times a day (BID) | ORAL | Status: DC | PRN

## 2014-10-09 NOTE — Progress Notes (Signed)
BH MD/PA/NP OP Progress Note  10/09/2014 2:08 PM Jacqueline Simpson  MRN:  [id number]  Subjective:   Pt is doing great. States she in a new relationship and it is very supportive. Pt has made new friends and likes them a lot. Pt has a good support circle. She is going out daily and is no longer isolating. Social anxiety is under better control. She still has anxiety about going out but does it anyway. She has not taken Vistaril and Propanolol in over 1 month.  Depression is resolved. Denies sad mood, crying spells, anhedonia, low motivation, worthlessness and hopelessness. Denies SI/HI. She has restarted her old crafts and hobbies.   Sleep and energy are good. Appetite is improved and she is eating 3 meals/day. Concentration is good.   Pt is taking Prozac 65m and denies SE. She never started 436mb/c she was worried it would cause GI upset.   Chief Complaint: "doing great" Chief Complaint    Follow-up     Visit Diagnosis:     ICD-9-CM ICD-10-CM   1. Social anxiety disorder 300.23 F40.10 FLUoxetine (PROZAC) 20 MG capsule     hydrOXYzine (ATARAX/VISTARIL) 25 MG tablet     propranolol (INDERAL) 10 MG tablet  2. Major depressive disorder, recurrent episode, moderate 296.32 F33.1 FLUoxetine (PROZAC) 20 MG capsule    Past Medical History:  Past Medical History  Diagnosis Date  . Depression   . Behcet's disease     hx. brain lesions, mouth and skin ulcers  . History of seizures as a child     due to Behcet's disease - no seizures since age 19. History of MRSA infection 2015    arms, legs  . Laceration of wrist with tendon involvement 1019/37/9024  self-inflicted  . Anxiety     Past Surgical History  Procedure Laterality Date  . Lumbar puncture  04/28/2003    under anesthesia  . Wound exploration Left 03/04/2014    Procedure: LEFT WRIST EXPLORATION;  Surgeon: KeLeanora CoverMD;  [facility] Service: [facility]opedics;  Laterality: Left;  . Artery and tendon  repair Left 03/04/2014    Procedure: ARTERY AND TENDON REPAIR;  Surgeon: KeLeanora CoverMD;  [facility];  Laterality: Left;   Family History:  Family History  Problem Relation Age of Onset  . Depression Mother   . ADD / ADHD Brother   . OCD Brother   . Bipolar disorder Maternal Uncle    Social History:  History   Social History  . Marital Status: Single    Spouse Name: N/A  . Number of Children: N/A  . Years of Education: N/A   Social History Main Topics  . Smoking status: Current Every Day Smoker -- .5 years    Types: E-cigarettes  . Smokeless tobacco: Never Used  . Alcohol Use: Yes     Comment: occasionally  . Drug Use: No  . Sexual Activity:    Partners: Female, Female   Other Topics Concern  . None   Social History Narrative  living at home with parents and brother. Unemployed.  reports that she has been smoking E-cigarettes. She has smoked for the past .5 years. She has never used smokeless tobacco. She reports that she drinks alcohol. She reports that she does not use illicit drugs.  Additional History:  Past Psychiatric History/Hospitalization(s): Anxiety: Yes Bipolar Disorder: No Depression: Yes Mania: No Psychosis: No Schizophrenia: No Personality Disorder: No  Hospitalization for psychiatric illness: Yes History of Electroconvulsive Shock Therapy: No Prior Suicide Attempts: Yes   Assessment:   Musculoskeletal: Strength & Muscle Tone: within normal limits Gait & Station: normal Patient leans: N/A  Psychiatric Specialty Exam: HPI  Review of Systems  Constitutional: Negative for fever, chills and weight loss.  HENT: Negative for congestion, ear pain, nosebleeds and sore throat.   Eyes: Negative for blurred vision, double vision and pain.  Respiratory: Negative for cough, shortness of breath and wheezing.   Cardiovascular: Negative for chest pain, palpitations and leg swelling.  Gastrointestinal:  Negative for heartburn, nausea, vomiting and abdominal pain.  Musculoskeletal: Negative for back pain, joint pain and neck pain.  Skin: Negative for itching and rash.  Neurological: Negative for dizziness, sensory change, seizures, loss of consciousness and headaches.  Psychiatric/Behavioral: Negative for depression, suicidal ideas, hallucinations and substance abuse. The patient is not nervous/anxious and does not have insomnia.     Blood pressure 111/74, pulse 87, height 4' 11"  (1.499 m), weight 94 lb 6.4 oz (42.82 kg).Body mass index is 19.06 kg/(m^2).  General Appearance: Casual  Eye Contact:  Good  Speech:  Clear and Coherent and Normal Rate  Volume:  Normal  Mood:  Euthymic  Affect:  Full Range  Thought Process:  Goal Directed  Orientation:  Full (Time, Place, and Person)  Thought Content:  Negative  Suicidal Thoughts:  No  Homicidal Thoughts:  No  Memory:  Immediate;   Fair Recent;   Good Remote;   Good  Judgement:  Good  Insight:  Good  Psychomotor Activity:  Normal  Concentration:  Good  Recall:  Good  Fund of Knowledge: Good  Language: Good  Akathisia:  No  Handed:  Right  AIMS (if indicated):  n/a  Assets:  Communication Skills Desire for Improvement Housing Intimacy Leisure Time Physical Health Resilience Social Support Talents/Skills Transportation  ADL's:  Intact  Cognition: WNL  Sleep:  good   Is the patient at risk to self?  No. Has the patient been a risk to self in the past 6 months?  No. Has the patient been a risk to self within the distant past?  Yes.   Is the patient a risk to others?  No. Has the patient been a risk to others in the past 6 months?  No. Has the patient been a risk to others within the distant past?  No.  Current Medications: Current Outpatient Prescriptions  Medication Sig Dispense Refill  . Certolizumab Pegol (CIMZIA PREFILLED) 2 X 200 MG/ML KIT Inject 200 mg into the skin See admin instructions. Every 10 days 1 kit   .  FLUoxetine (PROZAC) 20 MG capsule Take 2 capsules (40 mg total) by mouth daily. 60 capsule 2  . fluticasone (FLONASE) 50 MCG/ACT nasal spray Place 1 spray into both nostrils at bedtime.    Marland Kitchen ibuprofen (ADVIL,MOTRIN) 400 MG tablet Take 400 mg by mouth every 6 (six) hours as needed.    . norgestimate-ethinyl estradiol (ORTHO-CYCLEN, 28,) 0.25-35 MG-MCG tablet Take 1 tablet by mouth at bedtime. 1 Package   . hydrOXYzine (ATARAX/VISTARIL) 25 MG tablet Take 1 tablet (25 mg total) by mouth 2 (two) times daily as needed. (Patient not taking: Reported on 10/09/2014) 60 tablet 2  . propranolol (INDERAL) 10 MG tablet Take 1 tablet (10 mg total) by mouth 2 (two) times daily as needed (anxiety). (Patient not taking: Reported on 10/09/2014) 60 tablet 2   No current facility-administered medications for this visit.  Medical Decision Making:  Established Problem, Stable/Improving (1), Review of Psycho-Social Stressors (1), Review of Medication Regimen & Side Effects (2) and Review of New Medication or Change in Dosage (2)  Treatment Plan Summary:Medication management and Plan see below  Axis I: MDD-moderate, recurrent; Social anxiety disorder  Axis II: deferred    Plan: decrease Prozac to 72m po qD for mood and anxiety.  Vistaril 281mpo BID prn anxiety and sleep Propanolol 1096mo BID prn anxiety Medication management with supportive therapy. Risks/benefits and SE of the medication discussed. Pt verbalized understanding and verbal consent obtained for treatment. Affirm with the patient that the medications are taken as ordered. Patient expressed understanding of how their medications were to be used.  -improvement of depression and anxiety symptoms  Therapy: brief supportive therapy provided. Discussed psychosocial stressors in detail.  Encouraged to continue individual therapy with JenAnderson Maltat denies SI and is at an acute low risk for suicide.Patient told to call clinic if any problems occur.  Patient advised to go to ER if they should develop SI/HI, side effects, or if symptoms worsen. Has crisis numbers to call if needed. Pt verbalized understanding.  F/up in 6 months or sooner if needed          Niguel Moure, [place]/2016, 2:08 PM

## 2014-11-13 ENCOUNTER — Ambulatory Visit (INDEPENDENT_AMBULATORY_CARE_PROVIDER_SITE_OTHER): Payer: 59 | Admitting: Psychiatry

## 2014-11-13 DIAGNOSIS — F4010 Social phobia, unspecified: Secondary | ICD-10-CM | POA: Diagnosis not present

## 2014-11-13 NOTE — Progress Notes (Signed)
   THERAPIST PROGRESS NOTE   Session Time: 3:05-4:05  Participation Level: Active  Behavioral Response: NeatAlertEuthymic  Type of Therapy: Individual Therapy  Treatment Goals addressed: Anxiety and depression  Interventions: CBT   Summary: Jacqueline Simpson is a 19 y.o. female who presents with anxiety and depression.   Suicidal/Homicidal: Nowithout intent/plan  Therapist Response: Pt. Continues to present with euthymic mood. Pt. Planning to celebrate her 19th birthday on Monday. Pt. Reports that relationship with boyfriend is healthy and stable, share similar interests and temperament. Pt. Reports that most significant stressors are unemployment and feeling that she has disappointed her parents, desire to live independently. Pt. 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 Session focused on Pt.'s relationships with her parents. Pt. Feels frequently that she has disappointed her parents with her behavior, challenges learning to manage her depression, choice not to attend college, and difficulty finding a job. Pt. Reports that she does not share her feelings with her parents because she is afraid to make them angry. This counselor recommended a family session to facilitate assertive communication with her mother and father. Pt. Stated that she would talk to her mother about having a family session for our next session.   Plan: Continue with CBT based therapy. Return again in 2 weeks.  Diagnosis:Axis I: CD-9-CM: 296.30 ICD-10-CM: F33.9 Major depressive disorder, recurrent episode with anxious distress   Nancie Neas, [facility] 11/13/2014

## 2014-11-28 ENCOUNTER — Ambulatory Visit (HOSPITAL_COMMUNITY): Payer: Self-pay | Admitting: Psychiatry

## 2014-12-12 ENCOUNTER — Ambulatory Visit (HOSPITAL_COMMUNITY): Payer: Self-pay | Admitting: Psychiatry

## 2014-12-16 ENCOUNTER — Ambulatory Visit (HOSPITAL_COMMUNITY): Payer: Self-pay | Admitting: Psychiatry

## 2014-12-23 ENCOUNTER — Ambulatory Visit (INDEPENDENT_AMBULATORY_CARE_PROVIDER_SITE_OTHER): Payer: 59 | Admitting: Psychiatry

## 2014-12-23 DIAGNOSIS — F4010 Social phobia, unspecified: Secondary | ICD-10-CM
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
## 2014-12-26 ENCOUNTER — Ambulatory Visit (HOSPITAL_COMMUNITY): Payer: Self-pay | Admitting: Psychiatry

## 2014-12-30 ENCOUNTER — Ambulatory Visit (HOSPITAL_COMMUNITY): Payer: Self-pay | Admitting: Psychiatry

## 2015-01-06 ENCOUNTER — Ambulatory Visit (HOSPITAL_COMMUNITY): Payer: Self-pay | Admitting: Psychiatry

## 2015-01-09 ENCOUNTER — Ambulatory Visit (HOSPITAL_COMMUNITY): Payer: Self-pay | Admitting: Psychiatry

## 2015-01-23 ENCOUNTER — Ambulatory Visit (HOSPITAL_COMMUNITY): Payer: Self-pay | Admitting: Psychiatry

## 2015-03-11 ENCOUNTER — Ambulatory Visit (INDEPENDENT_AMBULATORY_CARE_PROVIDER_SITE_OTHER): Payer: 59 | Admitting: Psychiatry

## 2015-03-11 DIAGNOSIS — F4010 Social phobia, unspecified: Secondary | ICD-10-CM

## 2015-03-12 ENCOUNTER — Telehealth (HOSPITAL_COMMUNITY): Payer: Self-pay

## 2015-03-12 ENCOUNTER — Telehealth (HOSPITAL_COMMUNITY): Payer: Self-pay | Admitting: Psychiatry

## 2015-03-12 DIAGNOSIS — F4010 Social phobia, unspecified: Secondary | ICD-10-CM

## 2015-03-12 DIAGNOSIS — F331 Major depressive disorder, recurrent, moderate: Secondary | ICD-10-CM

## 2015-03-12 MED ORDER — PAROXETINE HCL 20 MG PO TABS: 20.0000 mg | ORAL_TABLET | Freq: Every day | ORAL | Status: DC

## 2015-03-12 NOTE — Telephone Encounter (Signed)
Mom states pt has stopped taking her meds for about 1 week. Pt told her mom that the meds were not helping and they made her feel "blah". Mom is concerned b/c is sleeping all day. Mom made pt call our office as pt did not want to.

## 2015-03-12 NOTE — Progress Notes (Signed)
   THERAPIST PROGRESS NOTE  Session Time: 1:05-2:00  Participation Level: Active  Behavioral Response: NeatAlertAnxiousMildlyDepressed  Type of Therapy: Individual Therapy  Treatment Goals addressed: Anxiety and depression  Interventions: CBT   Summary: Jacqueline Simpson is a 19 y.o. female who presents with anxiety and depression.   Suicidal/Homicidal: Nowithout intent/plan  Therapist Response: Pt. Presents with anxiety and mild depression. Pt. Reports that she stopped taking her prozac about one week ago because of loss of sexual desire and "just did not feel like myself". Pt. Reports that she also discontinued her vistaril because it made her feel sleepy all of the time and very hungry. Pt. Reported that he seemed to help her with the anxiety, but the sleepiness was not tolerable. Pt. reports that she continues to struggle with social anxiety, and feels overwhelmed in social situations with large groups of people. Pt. Reports that she sometimes self-medicates with alcohol, but would like to stop doing this. Grounding exercises were reviewed with Pt. Which she reports that she has difficulty doing at onset of her anxiety. Pt. Was instructed that she would receive best result if she does the exercises daily at the same time each day in order to train her brain to be less stimulated during stressful situations. Pt. Reported that she would forget to do this. Pt. Was encouraged to set reminders on her phone or to use apps such as serenita that would provide a reminder for her. Pt. Actively engaged in "people I admire exercise" and was able to identify core attributes of self outgoing, confident, and talkative. Pt. Engaged in discussion about how these attributes are reflections of her core self on her best days.   Plan: Continue with CBT based therapy. Return again in 2 weeks.  Diagnosis:Axis I: CD-9-CM: 296.30 ICD-10-CM: F33.9 Major depressive disorder, recurrent episode with anxious  distress   Nancie Neas, [facility] 03/12/2015

## 2015-03-12 NOTE — Telephone Encounter (Signed)
Pt states she doesn't feel like herself. Reports low libido with lack of motivation. Also reports sad mood and denies anhedonia. Denies SI/HI. Sleep is poor and she goes to bed when the sun comes up. She is spending a lot of time at her boyfriend's house. States symptoms began in in May. Pt stopped taking Prozac 20mg  one week ago. This week she is a little more motivated and slept at a decent time. Denies seasonal change in mood. Pt did not take Propanolol and Vistaril consistently because they made her tired.   A/P: MDD-moderate, recurrent; Social anxiety disorder 1. D/c Prozac 2. Start trial of Paxil 20mg  po qHS

## 2015-03-26 ENCOUNTER — Encounter (HOSPITAL_COMMUNITY): Payer: Self-pay | Admitting: Psychiatry

## 2015-03-26 ENCOUNTER — Ambulatory Visit (INDEPENDENT_AMBULATORY_CARE_PROVIDER_SITE_OTHER): Payer: 59 | Admitting: Psychiatry

## 2015-03-26 VITALS — BP 112/78 | HR 90 | Ht 59.0 in | Wt 97.4 lb

## 2015-03-26 DIAGNOSIS — F4010 Social phobia, unspecified: Secondary | ICD-10-CM | POA: Diagnosis not present

## 2015-03-26 DIAGNOSIS — F331 Major depressive disorder, recurrent, moderate: Secondary | ICD-10-CM | POA: Diagnosis not present

## 2015-03-26 MED ORDER — LORAZEPAM 0.5 MG PO TABS: 0.5000 mg | ORAL_TABLET | Freq: Two times a day (BID) | ORAL | Status: DC | PRN

## 2015-03-26 MED ORDER — PAROXETINE HCL 20 MG PO TABS: 20.0000 mg | ORAL_TABLET | Freq: Every day | ORAL | Status: DC

## 2015-03-26 NOTE — Progress Notes (Signed)
Patient ID: Jacqueline Simpson, female   DOB: 1995-10-22, 19 y.o.   MRN: [id number] [facility] MD/PA/NP OP Progress Note  03/26/2015 10:45 AM MINH JASPER  MRN:  [id number]  Subjective:   Pt reports she became depressed a little over 1 month ago. It came on gradually and pt is not able to identify a cause. She was having low motivation, poor energy, anhedonia, sad mood and insomnia. Pt stopped Prozac about 3 weeks ago due to low libido. Pt was started on Paxil 2 weeks ago. Today states she is doing better. A few days ago sleep and energy improved. Pt is sleeping about 9 hrs/night. She goes to bed around 1am but she used to sleep at 5am. She is now able to clear to her mind after one hour and sleep better.  Motivation is improving and she cleaned her room and is going out more. Pt is going to the park. Anhedonia is slowly improving. Denies crying spells, worthlessness and hopelessness. Denies SI/HI. Denies AVH.   Appetite is improved and she is eating 3 meals/day. Concentration is poor unless she is really interested in something.   Pt continues to isolate. She continues to have a lot of anxiety about socializing and pt is not making an effort to interact with many people. Pt has filled out job applications but has not turned them in due to anxiety. Pt is not able to enjoy social activities due to anxiety. Pt really wants to get a job due to her anxiety.   Pt states she is applying for disability and would like records send to the evaluator. Feels she can not work full time due to her medical illness and social anxiety.   Pt is taking Paxil 59m and denies SE. States she feels good about the Paxil.   Chief Complaint: "doing great" Chief Complaint    Follow-up     Visit Diagnosis:   No diagnosis found.  Past Medical History:  Past Medical History  Diagnosis Date  . Depression   . [place]     hx. brain lesions, mouth and skin ulcers  . 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 History of MRSA infection 2015    arms, legs  . Laceration of wrist with tendon involvement 101/01/3234   self-inflicted  . Anxiety     Past Surgical History  Procedure Laterality Date  . Lumbar puncture  04/28/2003    under anesthesia  . Wound exploration Left 03/04/2014    Procedure: LEFT WRIST EXPLORATION;  Surgeon: KLeanora Cover MD;  [place]  Service: [place]opedics;  Laterality: Left;  . Artery and tendon repair Left 03/04/2014    Procedure: ARTERY AND TENDON REPAIR;  Surgeon: KLeanora Cover MD;  [place]  Service: [place]ics;  Laterality: Left;   Family History:  Family History  Problem Relation Age of Onset  . Depression Mother   . ADD / ADHD Brother   . OCD Brother   . Bipolar disorder Maternal Uncle    Social History:  Social History   Social History  . Marital Status: Single    Spouse Name: N/A  . Number of Children: N/A  . Years of Education: N/A   Social History Main Topics  . Smoking status: Current Every Day Smoker -- .5 years    Types: E-cigarettes  . Smokeless tobacco: Never Used  . Alcohol  Use: No     Comment: occasionally  . Drug Use: No  . Sexual Activity:    Partners: Female, Female   Other Topics Concern  . None   Social History Narrative  living at home with parents and brother. Unemployed.  reports that she has been smoking E-cigarettes. She has smoked for the past .5 years. She has never used smokeless tobacco. She reports that she drinks alcohol. She reports that she does not use illicit drugs.  Additional History:  Past Psychiatric History/Hospitalization(s): Anxiety: Yes Bipolar Disorder: No Depression: Yes Mania: No Psychosis: No Schizophrenia: No Personality Disorder: No Hospitalization for psychiatric illness: Yes History of Electroconvulsive Shock Therapy: No Prior Suicide Attempts: Yes   Assessment:    Musculoskeletal: Strength & Muscle Tone: within normal limits Gait & Station: normal Patient leans: N/A  Psychiatric Specialty Exam: HPI  Review of Systems  Constitutional: Negative for fever, chills and weight loss.  HENT: Negative for congestion, ear pain, nosebleeds and sore throat.   Eyes: Negative for blurred vision, double vision and pain.  Respiratory: Negative for cough, shortness of breath and wheezing.   Cardiovascular: Negative for chest pain, palpitations and leg swelling.  Gastrointestinal: Negative for heartburn, nausea, vomiting and abdominal pain.  Musculoskeletal: Negative for back pain, joint pain and neck pain.  Skin: Negative for itching and rash.  Neurological: Negative for dizziness, sensory change, seizures, loss of consciousness and headaches.  Psychiatric/Behavioral: Positive for depression. Negative for suicidal ideas, hallucinations and substance abuse. The patient is nervous/anxious. The patient does not have insomnia.     Blood pressure 112/78, pulse 90, height _0  (1.499 m), weight 97 lb 6.4 oz (44.18 kg).Body mass index is 19.66 kg/(m^2).  General Appearance: Casual  Eye Contact:  Good  Speech:  Clear and Coherent and Normal Rate  Volume:  Normal  Mood:  Anxious and Depressed  Affect:  Congruent  Thought Process:  Goal Directed  Orientation:  Full (Time, Place, and Person)  Thought Content:  Negative  Suicidal Thoughts:  No  Homicidal Thoughts:  No  Memory:  Immediate;   Fair Recent;   Good Remote;   Good  Judgement:  Good  Insight:  Good  Psychomotor Activity:  Normal  Concentration:  Good  Recall:  Good  Fund of Knowledge: Good  Language: Good  Akathisia:  No  Handed:  Right  AIMS (if indicated):  n/a  Assets:  Communication Skills Desire for Improvement Housing Intimacy Leisure Time Physical Health Resilience Social Support Talents/Skills Transportation  ADL's:  Intact  Cognition: WNL  Sleep:  good   Is the patient at  risk to self?  No. Has the patient been a risk to self in the past 6 months?  No. Has the patient been a risk to self within the distant past?  Yes.   Is the patient a risk to others?  No. Has the patient been a risk to others in the past 6 months?  No. Has the patient been a risk to others within the distant past?  No.  Current Medications: Current Outpatient Prescriptions  Medication Sig Dispense Refill  . Certolizumab Pegol (CIMZIA PREFILLED) 2 X 200 MG/ML KIT Inject 200 mg into the skin See admin instructions. Every 10 days 1 kit   . fluticasone (FLONASE) 50 MCG/ACT nasal spray Place 1 spray into both nostrils at bedtime.    Marland Kitchen ibuprofen (ADVIL,MOTRIN) 400 MG tablet Take 400 mg by mouth every 6 (six) hours as needed.    . norgestimate-ethinyl  estradiol (ORTHO-CYCLEN, 28,) 0.25-35 MG-MCG tablet Take 1 tablet by mouth at bedtime. 1 Package   . PARoxetine (PAXIL) 20 MG tablet Take 1 tablet (20 mg total) by mouth daily. 30 tablet 0  . hydrOXYzine (ATARAX/VISTARIL) 25 MG tablet Take 1 tablet (25 mg total) by mouth 2 (two) times daily as needed. (Patient not taking: Reported on 03/26/2015) 60 tablet 1  . propranolol (INDERAL) 10 MG tablet Take 1 tablet (10 mg total) by mouth 2 (two) times daily as needed (anxiety). (Patient not taking: Reported on 03/26/2015) 60 tablet 1   No current facility-administered medications for this visit.    Medical Decision Making:  Established Problem, Stable/Improving (1), Review of Psycho-Social Stressors (1), Review of Medication Regimen & Side Effects (2) and Review of New Medication or Change in Dosage (2)  Treatment Plan Summary:Medication management and Plan see below  Axis I: MDD-moderate, recurrent; Social anxiety disorder  Axis II: deferred    Plan: d/c Prozac     D/c Vistaril D/c Propanolol  Continue Paxil 43m po qD for mood and anxiety. Start temporary Ativan 0.586mpo BID prn anxiety Medication management with supportive therapy.  Risks/benefits and SE of the medication discussed. Pt verbalized understanding and verbal consent obtained for treatment. Affirm with the patient that the medications are taken as ordered. Patient expressed understanding of how their medications were to be used.    Therapy: brief supportive therapy provided. Discussed psychosocial stressors in detail.  Encouraged to continue individual therapy with JeAnderson MaltaPt denies SI and is at an acute low risk for suicide.Patient told to call clinic if any problems occur. Patient advised to go to ER if they should develop SI/HI, side effects, or if symptoms worsen. Has crisis numbers to call if needed. Pt verbalized understanding.  F/up in 2 months or sooner if needed          Carmen Tolliver 03/26/2015, 10:45 AM

## 2015-03-31 ENCOUNTER — Ambulatory Visit (HOSPITAL_COMMUNITY): Payer: Self-pay | Admitting: Psychiatry

## 2015-04-08 ENCOUNTER — Other Ambulatory Visit (HOSPITAL_COMMUNITY): Payer: Self-pay | Admitting: Psychiatry

## 2015-04-15 ENCOUNTER — Ambulatory Visit (INDEPENDENT_AMBULATORY_CARE_PROVIDER_SITE_OTHER): Payer: 59 | Admitting: Psychiatry

## 2015-04-15 DIAGNOSIS — F331 Major depressive disorder, recurrent, moderate: Secondary | ICD-10-CM

## 2015-04-16 NOTE — Progress Notes (Signed)
   THERAPIST PROGRESS NOTE   Session Time: 3:30-4:25  Participation Level: Active  Behavioral Response: NeatAlertEuthymic  Type of Therapy: Individual Therapy  Treatment Goals addressed: Anxiety and depression  Interventions: CBT   Summary: Jacqueline Simpson is a 19 y.o. female who presents with anxiety and depression.   Suicidal/Homicidal: Nowithout intent/plan  Therapist Response: Pt. Presents with euthymic mood, significantly improved compared to last session. Pt. Reports that she ended medication and requested new medication for anxiety from Dr. Darrall Dears and that it is working very well for her. Pt. Reports that she continues to get into disagreements with her parents as they want her to be making more progress towards goal of working. Pt. Reports that her parents see her depression and anxiety getting better, but that she continues to have flair's from Bechet's that cause rash on extremities and joint inflammation that makes it painful for her to stand or engage in physical activity for extended periods of time. Pt. Reports that she got into a disagreement with her father during Thanksgiving when she accepted a small glass of wine that was offered to her by her grandmother during dinner. Pt. Perceived that she was drinking responsibly and felt that her father over-reacted. Significant time was spent in the session validating patient's need for patience and understanding from her parents and normalizing her parent's concerns as part of their grieving and acceptance process related to her depression recovery and living with Bechet's  Plan: Continue with CBT based therapy. Return again in 2 weeks.  Diagnosis:Axis I: CD-9-CM: 296.30 ICD-10-CM: F33.9 Major depressive disorder, recurrent episode with anxious distress  Nancie Neas, [facility] 04/16/2015

## 2015-05-28 ENCOUNTER — Ambulatory Visit (HOSPITAL_COMMUNITY): Payer: Self-pay | Admitting: Psychiatry

## 2015-06-09 ENCOUNTER — Encounter (HOSPITAL_COMMUNITY): Payer: Self-pay | Admitting: Psychiatry

## 2015-06-09 ENCOUNTER — Ambulatory Visit (INDEPENDENT_AMBULATORY_CARE_PROVIDER_SITE_OTHER): Payer: 59 | Admitting: Psychiatry

## 2015-06-09 VITALS — BP 102/66 | HR 79 | Ht 59.0 in | Wt 99.4 lb

## 2015-06-09 DIAGNOSIS — F331 Major depressive disorder, recurrent, moderate: Secondary | ICD-10-CM | POA: Diagnosis not present

## 2015-06-09 DIAGNOSIS — F4010 Social phobia, unspecified: Secondary | ICD-10-CM | POA: Diagnosis not present

## 2015-06-09 MED ORDER — LORAZEPAM 0.5 MG PO TABS: 0.5000 mg | ORAL_TABLET | Freq: Two times a day (BID) | ORAL | Status: DC | PRN

## 2015-06-09 MED ORDER — PAROXETINE HCL 20 MG PO TABS: 20.0000 mg | ORAL_TABLET | Freq: Every day | ORAL | Status: DC

## 2015-06-09 NOTE — Progress Notes (Signed)
[facility] MD/PA/NP OP Progress Note  06/09/2015 10:27 AM Jacqueline Simpson  MRN:  [id number]  Subjective:   States she is feeling really good lately. States she feels like a new person. Anxiety is decreased and mild. She is better able to tolerate it. She takes Ativan about once a month when the anxiety is out of control. Pt is feeling more motivated.   Sleep is good and she is getting up earlier. Energy is ok. Appetite is good. Concentration is good now.   Today states she is doing better.States she has has not been depressed and has not been thinking about it. Social media and recent news causes some sad mood. She has been avoiding social media and feels better.  Motivation is improving and she cleaned her room and is going out more. Anhedonia is slowly improving. Denies crying spells, worthlessness and hopelessness. Denies SI/HI. Denies AVH.   Isolation is improving and she is meeting people and going out more. Social anxiety is present but she is working on it. She has fun when she does go out and is able to relax.  Pt states she applied for disability and was denied. Feels she can not work full time due to her medical illness and social anxiety. Pt has applied for a part time job at [employer].   Pt is taking Paxil 84m and endorsing SE of constipation. States she feels good about the Paxil.   Chief Complaint: "doing great" Chief Complaint    Follow-up     Visit Diagnosis:     ICD-9-CM ICD-10-CM   1. MDD (major depressive disorder), recurrent episode, moderate (HCC) 296.32 F33.1 PARoxetine (PAXIL) 20 MG tablet  2. Social anxiety disorder 300.23 F40.10 PARoxetine (PAXIL) 20 MG tablet     LORazepam (ATIVAN) 0.5 MG tablet    Past Medical History:  Past Medical History  Diagnosis Date  . Depression   . [place]     hx. brain lesions, mouth and skin ulcers  . History of seizures as a child     due to Behcet's disease - no seizures since age 20 . History of MRSA infection  2015    arms, legs  . Laceration of wrist with tendon involvement 185/27/7824   self-inflicted  . Anxiety     Past Surgical History  Procedure Laterality Date  . Lumbar puncture  04/28/2003    under anesthesia  . Wound exploration Left 03/04/2014    Procedure: LEFT WRIST EXPLORATION;  Surgeon: KLeanora Cover MD;  [place]  Service: Orthopedics;  Laterality: Left;  . Artery and tendon repair Left 03/04/2014    Procedure: ARTERY AND TENDON REPAIR;  Surgeon: KLeanora Cover MD;  [place]  Service: Orthopedics;  Laterality: Left;   Family History:  Family History  Problem Relation Age of Onset  . Depression Mother   . ADD / ADHD Brother   . OCD Brother   . Bipolar disorder Maternal Uncle    Social History:  Social History   Social History  . Marital Status: Single    Spouse Name: N/A  . Number of Children: N/A  . Years of Education: N/A   Social History Main Topics  . Smoking status: Current Every Day Smoker -- .5 years    Types: E-cigarettes  . Smokeless tobacco: Never Used  . Alcohol Use: No     Comment: occasionally  . Drug Use: Yes    Special: Marijuana  Comment: CBD OIL AVAILABLE AT [employer]- USING SEVERAL TIMES A WEEK  . Sexual Activity:    Partners: Female, Female   Other Topics Concern  . None   Social History Narrative  living at home with parents and brother. Unemployed.  reports that she has been smoking E-cigarettes. She has smoked for the past .5 years. She has never used smokeless tobacco. She reports that she drinks alcohol. She reports that she does not use illicit drugs.  Additional History:  Past Psychiatric History/Hospitalization(s): Anxiety: Yes Bipolar Disorder: No Depression: Yes Mania: No Psychosis: No Schizophrenia: No Personality Disorder: No Hospitalization for psychiatric illness: Yes History of Electroconvulsive Shock Therapy: No Prior Suicide Attempts:  Yes   Musculoskeletal: Strength & Muscle Tone: within normal limits Gait & Station: normal Patient leans: N/A  Psychiatric Specialty Exam: HPI  Review of Systems  Constitutional: Negative for fever, chills and weight loss.  HENT: Negative for congestion, ear pain, nosebleeds and sore throat.   Eyes: Negative for blurred vision, double vision and pain.  Respiratory: Negative for cough, shortness of breath and wheezing.   Cardiovascular: Negative for chest pain, palpitations and leg swelling.  Gastrointestinal: Positive for constipation. Negative for heartburn, nausea, vomiting and abdominal pain.  Musculoskeletal: Negative for back pain, joint pain and neck pain.  Skin: Negative for itching and rash.  Neurological: Negative for dizziness, sensory change, seizures, loss of consciousness and headaches.  Psychiatric/Behavioral: Negative for depression, suicidal ideas, hallucinations and substance abuse. The patient is nervous/anxious. The patient does not have insomnia.     Blood pressure 102/66, pulse 79, height '4\' 11"'$  (1.499 m), weight 99 lb 6.4 oz (45.088 kg).Body mass index is 20.07 kg/(m^2).  General Appearance: Fairly Groomed  Eye Contact:  Good  Speech:  Clear and Coherent and Normal Rate  Volume:  Normal  Mood:  Euthymic  Affect:  Congruent  Thought Process:  Goal Directed  Orientation:  Full (Time, Place, and Person)  Thought Content:  Negative  Suicidal Thoughts:  No  Homicidal Thoughts:  No  Memory:  Immediate;   Fair Recent;   Good Remote;   Good  Judgement:  Good  Insight:  Good  Psychomotor Activity:  Normal  Concentration:  Good  Recall:  Good  Fund of Knowledge: Good  Language: Good  Akathisia:  No  Handed:  Right  AIMS (if indicated):  n/a  Assets:  Communication Skills Desire for Improvement Housing Intimacy Leisure Time Physical Health Resilience Social Support Talents/Skills Transportation  ADL's:  Intact  Cognition: WNL  Sleep:  good   Is  the patient at risk to self?  No. Has the patient been a risk to self in the past 6 months?  No. Has the patient been a risk to self within the distant past?  Yes.   Is the patient a risk to others?  No. Has the patient been a risk to others in the past 6 months?  No. Has the patient been a risk to others within the distant past?  No.  Current Medications: Current Outpatient Prescriptions  Medication Sig Dispense Refill  . ibuprofen (ADVIL,MOTRIN) 400 MG tablet Take 400 mg by mouth every 6 (six) hours as needed.    Marland Kitchen LORazepam (ATIVAN) 0.5 MG tablet Take 1 tablet (0.5 mg total) by mouth 2 (two) times daily as needed for anxiety. 60 tablet 1  . norgestimate-ethinyl estradiol (ORTHO-CYCLEN, 28,) 0.25-35 MG-MCG tablet Take 1 tablet by mouth at bedtime. 1 Package   . PARoxetine (PAXIL) 20 MG  tablet Take 1 tablet (20 mg total) by mouth daily. 30 tablet 1  . Certolizumab Pegol (CIMZIA PREFILLED) 2 X 200 MG/ML KIT Inject 200 mg into the skin See admin instructions. Every 10 days 1 kit   . fluticasone (FLONASE) 50 MCG/ACT nasal spray Place 1 spray into both nostrils at bedtime. (Patient not taking: Reported on 06/09/2015)    . hydrOXYzine (ATARAX/VISTARIL) 25 MG tablet Take 1 tablet (25 mg total) by mouth 2 (two) times daily as needed. (Patient not taking: Reported on 03/26/2015) 60 tablet 1  . propranolol (INDERAL) 10 MG tablet Take 1 tablet (10 mg total) by mouth 2 (two) times daily as needed (anxiety). (Patient not taking: Reported on 03/26/2015) 60 tablet 1   No current facility-administered medications for this visit.    Medical Decision Making:  Established Problem, Stable/Improving (1), Review of Psycho-Social Stressors (1) and Review of Medication Regimen & Side Effects (2)  Treatment Plan Summary:Medication management and Plan see below  Axis I: MDD-moderate, recurrent; Social anxiety disorder  Axis II: deferred    Plan: D/c Vistaril D/c Propanolol  Continue Paxil 72m po qD for  mood and anxiety. Start temporary Ativan 0.573mpo BID prn anxiety Medication management with supportive therapy. Risks/benefits and SE of the medication discussed. Pt verbalized understanding and verbal consent obtained for treatment. Affirm with the patient that the medications are taken as ordered. Patient expressed understanding of how their medications were to be used.    Therapy: brief supportive therapy provided. Discussed psychosocial stressors in detail.  Encouraged to continue individual therapy with JeAnderson MaltaPt denies SI and is at an acute low risk for suicide.Patient told to call clinic if any problems occur. Patient advised to go to ER if they should develop SI/HI, side effects, or if symptoms worsen. Has crisis numbers to call if needed. Pt verbalized understanding.  F/up in 3 months or sooner if needed          Jacqueline Simpson, [place]/24/2017, 10:27 AM

## 2015-06-29 ENCOUNTER — Ambulatory Visit (HOSPITAL_COMMUNITY): Payer: Self-pay | Admitting: Psychiatry

## 2015-07-23 ENCOUNTER — Ambulatory Visit (HOSPITAL_COMMUNITY): Payer: Self-pay | Admitting: Psychiatry

## 2015-09-08 ENCOUNTER — Ambulatory Visit (HOSPITAL_COMMUNITY): Payer: Self-pay | Admitting: Psychiatry

## 2015-09-29 ENCOUNTER — Other Ambulatory Visit (HOSPITAL_COMMUNITY): Payer: Self-pay | Admitting: Psychiatry

## 2015-10-15 ENCOUNTER — Encounter (HOSPITAL_COMMUNITY): Payer: Self-pay | Admitting: Psychiatry

## 2015-10-15 ENCOUNTER — Ambulatory Visit (INDEPENDENT_AMBULATORY_CARE_PROVIDER_SITE_OTHER): Payer: 59 | Admitting: Psychiatry

## 2015-10-15 ENCOUNTER — Telehealth (HOSPITAL_COMMUNITY): Payer: Self-pay

## 2015-10-15 VITALS — BP 108/70 | HR 108 | Ht 59.0 in | Wt 100.8 lb

## 2015-10-15 DIAGNOSIS — F331 Major depressive disorder, recurrent, moderate: Secondary | ICD-10-CM | POA: Diagnosis not present

## 2015-10-15 DIAGNOSIS — F4010 Social phobia, unspecified: Secondary | ICD-10-CM | POA: Diagnosis not present

## 2015-10-15 NOTE — Progress Notes (Signed)
Patient ID: Jacqueline Simpson, female   DOB: 1996-05-07, 20 y.o.   MRN: [id number] [facility] MD/PA/NP OP Progress Note  10/15/2015 3:32 PM Jacqueline Simpson  MRN:  [id number]  Subjective:   Pt stopped taking Paxil about 1 month ago. States it made her numb and unable to experience a full range of emotions. Initially she stated it helped with mood but not anymore. States now off the meds her mood and motivation are better.   Home life remains difficult. Pt is fighting with mom a lot.   Sleep is poor and she is restless. Pt is getting about 4 hrs/night.  Energy is ok. Appetite is good. Concentration is good now.   States she has has not been depressed for a while.  Social media and recent news causes some sad mood. It lasts for a day then resolves. She has been avoiding social media and feels better.  Denies anhedonia, isolation, crying spells, worthlessness and hopelessness. Denies SI/HI. Denies AVH.   Isolation is improving and she is meeting people and going out more. Social anxiety is present but she is working on it. She has fun when she does go out and is able to relax. Pt is starting a new job at [employer]. Pt takes Ativan once a month or less.   Pt no longer wants to take meds.   Chief Complaint: "doing great" Chief Complaint    Follow-up     Visit Diagnosis:     ICD-9-CM ICD-10-CM   1. MDD (major depressive disorder), recurrent episode, moderate (HCC) 296.32 F33.1   2. Social anxiety disorder 300.23 F40.10     Past Medical History:  Past Medical History  Diagnosis Date  . Depression   . Behcet's disease ([place])     hx. brain lesions, mouth and skin ulcers  . History of seizures as a child     due to Behcet's disease - no seizures since age 85  . History of MRSA infection 2015    arms, legs  . Laceration of wrist with tendon involvement 56/25/6389    self-inflicted  . Anxiety     Past Surgical History  Procedure Laterality Date  . Lumbar puncture  04/28/2003    under  anesthesia  . Wound exploration Left 03/04/2014    Procedure: LEFT WRIST EXPLORATION;  Surgeon: Leanora Cover, MD;  Location: [place];  Service: Orthopedics;  Laterality: Left;  . Artery and tendon repair Left 03/04/2014    Procedure: ARTERY AND TENDON REPAIR;  Surgeon: Leanora Cover, MD;  Location: [place];  Service: Orthopedics;  Laterality: Left;   Family History:  Family History  Problem Relation Age of Onset  . Depression Mother   . ADD / ADHD Brother   . OCD Brother   . Bipolar disorder Maternal Uncle    Social History:  Social History   Social History  . Marital Status: Single    Spouse Name: N/A  . Number of Children: N/A  . Years of Education: N/A   Social History Main Topics  . Smoking status: Current Every Day Smoker -- .5 years    Types: E-cigarettes  . Smokeless tobacco: Never Used  . Alcohol Use: No     Comment: occasionally  . Drug Use: Yes    Special: Marijuana     Comment: CBD OIL AVAILABLE AT [facility]- USING SEVERAL TIMES A WEEK  . Sexual Activity:    Partners: Female, Female   Other Topics Concern  .  None   Social History Narrative  living at home with parents and brother. Unemployed.  reports that she has been smoking E-cigarettes. She has smoked for the past .5 years. She has never used smokeless tobacco. She reports that she drinks alcohol. She reports that she does not use illicit drugs.  Additional History:  Past Psychiatric History/Hospitalization(s): Anxiety: Yes Bipolar Disorder: No Depression: Yes Mania: No Psychosis: No Schizophrenia: No Personality Disorder: No Hospitalization for psychiatric illness: Yes History of Electroconvulsive Shock Therapy: No Prior Suicide Attempts: Yes   Musculoskeletal: Strength & Muscle Tone: within normal limits Gait & Station: normal Patient leans: straight  Psychiatric Specialty Exam: HPI  Review of Systems  Constitutional: Negative for fever, chills and weight  loss.  HENT: Negative for congestion, ear pain, nosebleeds and sore throat.   Eyes: Negative for blurred vision, double vision and pain.  Respiratory: Negative for cough, shortness of breath and wheezing.   Cardiovascular: Negative for chest pain, palpitations and leg swelling.  Gastrointestinal: Negative for heartburn, nausea, vomiting, abdominal pain and constipation.  Musculoskeletal: Negative for back pain, joint pain and neck pain.  Skin: Negative for itching and rash.  Neurological: Negative for dizziness, sensory change, seizures, loss of consciousness and headaches.  Psychiatric/Behavioral: Negative for depression, suicidal ideas, hallucinations and substance abuse. The patient is nervous/anxious. The patient does not have insomnia.     Blood pressure 108/70, pulse 108, height 4' 11"  (1.499 m), weight 100 lb 12.8 oz (45.723 kg).Body mass index is 20.35 kg/(m^2).  General Appearance: Fairly Groomed  Eye Contact:  Good  Speech:  Clear and Coherent and Normal Rate  Volume:  Normal  Mood:  Euthymic  Affect:  Congruent  Thought Process:  Goal Directed  Orientation:  Full (Time, Place, and Person)  Thought Content:  Negative  Suicidal Thoughts:  No  Homicidal Thoughts:  No  Memory:  Immediate;   Fair Recent;   Good Remote;   Good  Judgement:  Good  Insight:  Good  Psychomotor Activity:  Normal  Concentration:  Good  Recall:  Good  Fund of Knowledge: Good  Language: Good  Akathisia:  No  Handed:  Right  AIMS (if indicated):  n/a  Assets:  Communication Skills Desire for Improvement Housing Intimacy Leisure Time Physical Health Resilience Social Support Talents/Skills Transportation  ADL's:  Intact  Cognition: WNL  Sleep:  good   Is the patient at risk to self?  No. Has the patient been a risk to self in the past 6 months?  No. Has the patient been a risk to self within the distant past?  Yes.   Is the patient a risk to others?  No. Has the patient been a risk to  others in the past 6 months?  No. Has the patient been a risk to others within the distant past?  No.  Current Medications: Current Outpatient Prescriptions  Medication Sig Dispense Refill  . Certolizumab Pegol (CIMZIA PREFILLED) 2 X 200 MG/ML KIT Inject 200 mg into the skin See admin instructions. Every 10 days 1 kit   . LORazepam (ATIVAN) 0.5 MG tablet Take 1 tablet (0.5 mg total) by mouth 2 (two) times daily as needed for anxiety. 60 tablet 1  . norgestimate-ethinyl estradiol (ORTHO-CYCLEN, 28,) 0.25-35 MG-MCG tablet Take 1 tablet by mouth at bedtime. 1 Package   . fluticasone (FLONASE) 50 MCG/ACT nasal spray Place 1 spray into both nostrils at bedtime. (Patient not taking: Reported on 06/09/2015)    . ibuprofen (ADVIL,MOTRIN) 400 MG tablet  Take 400 mg by mouth every 6 (six) hours as needed. Reported on 10/15/2015    . PARoxetine (PAXIL) 20 MG tablet Take 1 tablet (20 mg total) by mouth daily. (Patient not taking: Reported on 10/15/2015) 90 tablet 0   No current facility-administered medications for this visit.    Medical Decision Making:  Established Problem, Stable/Improving (1), Review of Psycho-Social Stressors (1), Review of Medication Regimen & Side Effects (2) and Review of New Medication or Change in Dosage (2)  Treatment Plan Summary:Medication management and Plan see below  Axis I: MDD-moderate, recurrent; Social anxiety disorder  Axis II: deferred    Plan: D/c Paxil  D/c Ativan Pt does not want to take meds at this time. States she is doing better and is stable now.  Medication management with supportive therapy. Risks/benefits and SE of the medication discussed. Pt verbalized understanding and verbal consent obtained for treatment. Affirm with the patient that the medications are taken as ordered. Patient expressed understanding of how their medications were to be used.    Therapy: brief supportive therapy provided. Discussed psychosocial stressors in detail.   Encouraged to restart individual therapy   Pt denies SI and is at an acute low risk for suicide.Patient told to call clinic if any problems occur. Patient advised to go to ER if they should develop SI/HI, side effects, or if symptoms worsen. Has crisis numbers to call if needed. Pt verbalized understanding.  F/up as needed          Charlcie Cradle 10/15/2015, 3:32 PM

## 2015-10-17 ENCOUNTER — Other Ambulatory Visit (HOSPITAL_COMMUNITY): Payer: Self-pay | Admitting: Psychiatry

## 2016-01-07 ENCOUNTER — Encounter: Payer: Self-pay | Admitting: Neurology

## 2016-01-07 ENCOUNTER — Encounter: Payer: Self-pay | Admitting: Rheumatology

## 2016-02-23 ENCOUNTER — Encounter: Payer: Self-pay | Admitting: Neurology

## 2016-02-23 ENCOUNTER — Ambulatory Visit (INDEPENDENT_AMBULATORY_CARE_PROVIDER_SITE_OTHER): Payer: 59 | Admitting: Neurology

## 2016-02-23 VITALS — BP 104/75 | HR 84 | Ht 59.0 in | Wt 107.0 lb

## 2016-02-23 DIAGNOSIS — Z8669 Personal history of other diseases of the nervous system and sense organs: Secondary | ICD-10-CM

## 2016-02-23 DIAGNOSIS — M352 Behcet's disease: Secondary | ICD-10-CM | POA: Insufficient documentation

## 2016-02-23 NOTE — Patient Instructions (Addendum)
I had a long discussion with the patient and mother regarding her long-standing history of Behcet's syndrome. She has not had any active neurological symptoms for several years. I recommend repeating MRI scan of the brain with and without contrast to look at her brain lesions stability Behcet Syndrome Behcet syndrome is a rare disorder that involves inflammation of blood vessels (vasculitis) throughout your body. This condition usually begins between the ages of 79 years and 40 years. Behcet syndrome can range from mild to severe and is a condition you will have for the rest of your life (chronic). There is no cure, but symptoms may go away on their own for periods of time. It can cause various symptoms, including open sores (ulcers) in your mouth or on your genitals. It can affect many organs and systems in your body, including your heart, lungs, digestive system, and nervous systems. It can sometimes lead to blindness. Behcet syndrome is not spread from person to person (contagious). CAUSES  The exact cause is unknown. The condition tends to run in families. Some genes that increase risk for Behcet syndrome have been identified. If you inherit these genes, it may increase your risk.  RISK FACTORS  Being of Asian, Middle [place], or [place] descent.  Being 42-48 years of age. SYMPTOMS  Signs and symptoms vary depending on the areas of the body that are affected. Early and common signs and symptoms include:   Open sores on your:  Mouth. These may look like canker sores. The sores may come and go.  Genitals. These may come and go and leave scars when they heal.  Skin. These may appear as painful red bumps or pimples.  Eye problems including:  Redness.  Blurred vision.  Tearing.  Pain.  Inflammation (uveitis).  Arthritis.   Swelling of the brain and spinal cord (meningoencephalitis). Less common signs and symptoms may develop over time and can include:   Abdominal pain and  bleeding in your digestive system.  Memory loss.  Behavior changes.  Loss of interest in things you enjoy (apathy).  Seizures.  Blood clots.  Weakening of blood vessels (aneurysms).  Chest pain.  Trouble breathing.  Impaired speech, balance, and movement. DIAGNOSIS  Behcet syndrome is hard to diagnose because there will be times when you are symptom free. Your health care provider may diagnose the condition based on your medical history and a physical exam. The main factors that help confirm the diagnosis are presence of mouth sores at least three times in 1 year and any two of the following:  Genital sores that keep coming back.  Eye inflammation with loss of vision.  Skin sores that are characteristic of Behcet syndrome.  A positive skin prick test. If you have the condition, a skin prick may produce a red bump in 1-2 days. Yourhealth care provider may perform additional tests, including:   CT or MRI scans of your joints, brain, or bones.  Blood vessel studies (angiogram).  Removing pieces of affected body tissue (biopsy) to check for vasculitis. TREATMENT  There is no cure for Behcet syndrome. Treatment typically focuses on relieving your discomfort and preventing serious complications. You may need to work with a team of health care providers because many different parts of your body may be involved. Common treatments include:  Strong anti-inflammatory medicines (corticosteroids).  Medicines that suppress your immune system and treat inflammation.  Steroid creams to treat oral and genital ulcers.  Other medicines your health care team may recommend based on your  symptoms and the parts of your body involved. HOME CARE INSTRUCTIONS Follow all your health care provider's instructions. Theinstructions you get will depend on your specific symptoms and treatments. General instructions may include:  Get plenty of rest, especially when your symptoms worsen.  Get  moderate exercise (walking and swimming) when not experiencing worsening of symptoms.  Include lots of vegetables, fruits, and whole grains in your diet.  Avoid high-fat foods.  Do not smoke.  Keep all follow-up appointments. SEEK MEDICAL CARE IF:  Your symptoms worsen and are not managed by medicines and home care. SEEK IMMEDIATE MEDICAL CARE IF:  You suddenly lose your vision.  You vomit blood or have blood in your stool.  You have very bad abdominal pain.  You suddenly have a very bad headache.  You have a seizure.  You have a red, warm, or tender swelling in your leg.  You have chest pain or trouble breathing. FOR MORE INFORMATION American Behcet's Disease Association: www.behcets.com   This information is not intended to replace advice given to you by your health care provider. Make sure you discuss any questions you have with your health care provider.   Document Released: 04/22/2002 Document Revised: 05/07/2013 Document Reviewed: 04/02/2013 Elsevier Interactive Patient Education [employer]. . Continue treatment for Behcet's disease as per her rheumatologist Dr. Gavin Pound. Return for follow-up with me in a year or call earlier if needed.

## 2016-02-23 NOTE — Progress Notes (Signed)
[facility] [address] [place]. [place] [address] [phone]       OFFICE CONSULT NOTE  Jacqueline Simpson Date of Birth:  [birth year] Medical Record Number:  [id number]   Referring MD:  Campbell Lerner Reason for Referral:  neurobehcet`s  HPI: Ms Jacqueline Simpson is a pleasant 20 year Caucasian lady who is seen today to establish neurological follow-up. She is accompanied by her mother. History is obtained from the patient, mother as well as review of referral notes from Dr. Trudie Reed as well as prior neurological clinic visits with Dr Pearletha Forge at [facility] through care everywhere. Mr. Jacqueline Simpson no is a 20 year old pleasant frail Caucasian lady who has long-standing history of autoimmune disease since 69 months of age when she first developed oral ulcers that proved to be resistant to variety of treatments. At around age 21 she was diagnosed with multiple sclerosis as a child after she had an episode of optic neuritis with temporary loss of vision in the left eye. She was seen initially with dedicated neurologist Dr. Faylene Million and subsequently was followed at [facility] by pediatric neurology as well as rheumatology. There was some mention of possible abnormal EEG and diagnosis of epilepsy but she was never placed on long-term neurologist. She was followed at some point by Dr. Loman Brooklyn a pediatric neurologist who specializes in [place] and practicing on [place], [place].. Patient was tried a variety of medications including Copaxone and CellCept and continued to have intermittent flares during her teenage years which comprised of symptoms like ataxia, dizziness, weakness, fatigue, slurred speech, Bell's palsy with partial resolution with steroids in 48-72 hours. She subsequently developed oral and vaginal ulcers at age 42 and was reported to OB/GYN and skin biopsy was obtained which suggested Behcet's however brain MRI findings are more suggestive of multiple sclerosis with multiple subcortical and cerebellar  white matter lesions. She eventually saw a dermatologist in [place] which specializes in Behcet's and change the diagnosis. She in fact was even tried on TNF inhibitors prior to Remicade, Imuran and Humira which also partially effective. Remicade appeared to work the best for due to concern about a severe allergic reaction after sulfa antibiotics it was discontinued. She has recently been on certolizumab pegol (CIMZIA) for last several years and is often the best control of her symptoms to date. And she is tolerated well without side effects. She now no longer wants to go to [facility] for her care and has shifted her rheumatological care to Dr. Gavin Pound in [place] and is here to establish neurological follow-up. She denies any active neurological symptoms in the form of headache, blurred vision, double vision, vertigo, gait or balance difficulties. Her last MRI scan the brain was in 2016 and and shown stable appearance of a cerebellar and white matter lesions. She also has a history of anxiety and depression is followed at the behavioral health clinic by psychiatrist Dr. Charlcie Cradle.. She has tried Zoloft and Paxil in the past but currently is not on them and she does see a counselor once a month and practices mindfulness. She has recently applied for disability. She is unable to hold a job due to her joint pains and aches. She is high school educated with some courses for college. I do not have any of patient's prior imaging studies to look at today but patient's mother informs me that she has discontinued home and will bring them for my review.      ROS:   14 system review of  systems is positive for  joint pain, joint swelling, anxiety, insomnia, depression and all systems negative  PMH:  Past Medical History:  Diagnosis Date  . Anxiety   . Behcet's disease ([place])    hx. brain lesions, mouth and skin ulcers  . Depression   . History of MRSA infection 2015   arms, legs  . History of seizures  as a child    due to Behcet's disease - no seizures since age 47  . Laceration of wrist with tendon involvement 22/06/5425   self-inflicted  . Social anxiety disorder     Social History:  Social History   Social History  . Marital status: Single    Spouse name: N/A  . Number of children: N/A  . Years of education: N/A   Occupational History  . Not on file.   Social History Main Topics  . Smoking status: Current Every Day Smoker    Years: 0.50    Types: E-cigarettes  . Smokeless tobacco: Never Used  . Alcohol use 0.6 oz/week    1 Glasses of wine per week     Comment: occasionally  . Drug use:     Types: Marijuana     Comment: CBD OIL AVAILABLE AT [facility]- USING SEVERAL TIMES A WEEK  . Sexual activity: Not Currently    Partners: Female, Female   Other Topics Concern  . Not on file   Social History Narrative  . No narrative on file    Medications:   Current Outpatient Prescriptions on File Prior to Visit  Medication Sig Dispense Refill  . Certolizumab Pegol (CIMZIA PREFILLED) 2 X 200 MG/ML KIT Inject 200 mg into the skin See admin instructions. Every 10 days 1 kit   . fluticasone (FLONASE) 50 MCG/ACT nasal spray Place 1 spray into both nostrils at bedtime.    Marland Kitchen ibuprofen (ADVIL,MOTRIN) 400 MG tablet Take 400 mg by mouth every 6 (six) hours as needed. Reported on 10/15/2015    . norgestimate-ethinyl estradiol (ORTHO-CYCLEN, 28,) 0.25-35 MG-MCG tablet Take 1 tablet by mouth at bedtime. 1 Package    No current facility-administered medications on file prior to visit.     Allergies:   Allergies  Allergen Reactions  . Colchicine Shortness Of Breath  . Milk-Related Compounds Other (See Comments)    GI UPSET  . Sulfa Antibiotics Hives and Swelling    SWELLING LIPS  . Adhesive [Tape] Other (See Comments)    SKIN IRRITATION  . Soap Itching    Physical Exam General: well developed, well nourished, seated, in no evident distress Head: head normocephalic and  atraumatic.   Neck: supple with no carotid or supraclavicular bruits Cardiovascular: regular rate and rhythm, no murmurs Musculoskeletal: no deformity Skin:  no rash/petichiae Vascular:  Normal pulses all extremities  Neurologic Exam Mental Status: Awake and fully alert. Oriented to place and time. Recent and remote memory intact. Attention span, concentration and fund of knowledge appropriate. Mood and affect appropriate.  Cranial Nerves: Fundoscopic exam reveals sharp disc margins And slight temporal pallor in the left. Left pupil is sluggishly reactive and right reacts better. No clear afferent pupillary defect. Vision acuity is slightly diminished in the left eye compared to the right.. Pupils equal, briskly reactive to light. Extraocular movements full without nystagmus. Visual fields full to confrontation. Hearing intact. Facial sensation intact. Face, tongue, palate moves normally and symmetrically.  Motor: Normal bulk and tone. Normal strength in all tested extremity muscles. Sensory.: intact to touch , pinprick , position  and vibratory sensation.  Coordination: Rapid alternating movements normal in all extremities. Finger-to-nose and heel-to-shin performed accurately bilaterally. Gait and Station: Arises from chair without difficulty. Stance is normal. Gait demonstrates normal stride length and balance . Able to heel, toe and tandem walk without difficulty.  Reflexes: 1+ and symmetric. Toes downgoing.       ASSESSMENT: 60 year Caucasian lady with long-standing history of Behcet's disease with CNS manifestations diagnosed since 2007. She was initially diagnosed with multiple sclerosis as she had optic neuritis and brain lesions at age 65 but the diagnosis was subsequently revised after she saw a Behcet's specialist in [place] as she did not improve with treatment for MS. She has had no active neurological symptoms for several years and her Behcet's appears to be quite stable on Cimzia  since 2016 as well. She is seen today to establish neurological care as she no longer wants to follow-up at [facility] for the same    PLAN: I had a long discussion with the patient and mother regarding her long-standing history of Behcet's syndrome. She has not had any active neurological symptoms for several years. I recommend repeating MRI scan of the brain with and without contrast to look at her brain lesions stability. Greater than 50% time during this 50 minute consultation visit for spent on counseling and coordination of care about the chest disease and neurological manifestations and answering questions. She will return for follow-up in a year or call earlier if necessary. Antony Contras, MD  [facility] [address] [place], [place] [address]  Phone [phone] Fax [phone] Note: This document was prepared with digital dictation and possible smart phrase technology. Any transcriptional errors that result from this process are unintentional.

## 2016-03-17 ENCOUNTER — Telehealth: Payer: Self-pay | Admitting: Neurology

## 2016-03-17 NOTE — Telephone Encounter (Signed)
Spoke with patients mother who requested we [facility].

## 2016-03-21 NOTE — Telephone Encounter (Signed)
Mike/[facility] [phone] called regarding [place] for MRI, please call.

## 2016-03-21 NOTE — Telephone Encounter (Signed)
[employer] did not approve the MR Brain w/wo contrast.. The peer to peer phone number is [phone] and select option 3 and the case number is [id number]. She does have a appointment at [facility] on 03/23/16

## 2016-03-22 NOTE — Telephone Encounter (Signed)
Spoke with Larene Beach at [facility] and gave her the [place]: [phone] (exp. 05/06/16)

## 2016-03-23 DIAGNOSIS — M352 Behcet's disease: Secondary | ICD-10-CM | POA: Diagnosis not present

## 2016-03-23 DIAGNOSIS — Z8669 Personal history of other diseases of the nervous system and sense organs: Secondary | ICD-10-CM | POA: Diagnosis not present

## 2016-03-28 ENCOUNTER — Ambulatory Visit (INDEPENDENT_AMBULATORY_CARE_PROVIDER_SITE_OTHER): Payer: Self-pay

## 2016-03-28 DIAGNOSIS — Z8669 Personal history of other diseases of the nervous system and sense organs: Secondary | ICD-10-CM

## 2016-03-28 DIAGNOSIS — Z0289 Encounter for other administrative examinations: Secondary | ICD-10-CM

## 2016-03-28 DIAGNOSIS — M352 Behcet's disease: Secondary | ICD-10-CM

## 2016-03-30 ENCOUNTER — Other Ambulatory Visit: Payer: 59

## 2016-06-16 ENCOUNTER — Ambulatory Visit (INDEPENDENT_AMBULATORY_CARE_PROVIDER_SITE_OTHER): Payer: 59 | Admitting: Psychiatry

## 2016-06-16 DIAGNOSIS — F331 Major depressive disorder, recurrent, moderate: Secondary | ICD-10-CM
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
## 2016-06-30 ENCOUNTER — Ambulatory Visit (INDEPENDENT_AMBULATORY_CARE_PROVIDER_SITE_OTHER): Payer: 59 | Admitting: Psychiatry

## 2016-06-30 DIAGNOSIS — Z23 Encounter for immunization: Secondary | ICD-10-CM | POA: Diagnosis not present

## 2016-06-30 DIAGNOSIS — F4010 Social phobia, unspecified: Secondary | ICD-10-CM

## 2016-06-30 DIAGNOSIS — F331 Major depressive disorder, recurrent, moderate: Secondary | ICD-10-CM

## 2016-06-30 DIAGNOSIS — F321 Major depressive disorder, single episode, moderate: Secondary | ICD-10-CM

## 2016-07-09 NOTE — Progress Notes (Signed)
   THERAPIST PROGRESS NOTE  Session Time: 3:05-4:00  Participation Level: Active  Behavioral Response: CasualAlertAnxious  Type of Therapy: Individual Therapy  Treatment Goals addressed: Anxiety  Interventions: Strength-based and Supportive  Summary: Jacqueline Simpson is a 21 y.o. female who presents with depression and anxiety.   Suicidal/Homicidal: Nowithout intent/plan  Therapist Response: Pt. Presents with mild anxiety. Session joined by Tobie Poet (Counseling Intern). Pt. Shared recent history with anxiety as it relates to her driving or being a passenger in a car. Pt. Also discussed recent history with social anxiety. Pt. Was able to develop awareness that her fear of driving and being in a car is related to suicide attempt about 2 years ago. This experience has evolved into a general fear of death or of having a traumatic or near death experience. Pt. Also discussed a contentious relationship with her boyfriend's mother who attempts to shame her because of her anxiety. Pt. Discussed that her relationships with her parents, brother and boyfriend are generally supportive and that she does not feel anxious when she is with them. Pt. Discussed that her Bechet's disease has worsened in the last year and causes her extreme joint pain and lethargy and she worries if she will ever be able to work or have a "normal" social life. Pt. Discussed interest in crocheting and expressed openness to getting involved in a crocheting or knitting group for stress management and socialization.  Kathlee Nations provided background on use of hypnosis and self-hypnosis to assist with managing anxiety symptoms.  Plan: Return again in 3 weeks.  Diagnosis: Axis I: Depressive Disorder NOS    Axis II: No diagnosis    Nancie Neas, [facility] 07/09/2016

## 2016-07-18 DIAGNOSIS — M352 Behcet's disease: Secondary | ICD-10-CM | POA: Diagnosis not present

## 2016-07-18 DIAGNOSIS — Z79899 Other long term (current) drug therapy: Secondary | ICD-10-CM | POA: Diagnosis not present

## 2016-07-18 DIAGNOSIS — M2550 Pain in unspecified joint: Secondary | ICD-10-CM | POA: Diagnosis not present

## 2016-07-27 ENCOUNTER — Ambulatory Visit (HOSPITAL_COMMUNITY): Payer: Self-pay | Admitting: Psychiatry

## 2016-08-04 ENCOUNTER — Ambulatory Visit (HOSPITAL_COMMUNITY): Payer: 59 | Admitting: Psychiatry

## 2016-09-13 ENCOUNTER — Ambulatory Visit (INDEPENDENT_AMBULATORY_CARE_PROVIDER_SITE_OTHER): Payer: 59 | Admitting: Psychiatry

## 2016-09-13 DIAGNOSIS — F321 Major depressive disorder, single episode, moderate: Secondary | ICD-10-CM | POA: Diagnosis not present

## 2016-09-16 NOTE — Progress Notes (Signed)
   THERAPIST PROGRESS NOTE   Session Time: 1:05-2:00  Participation Level: Active  Behavioral Response: CasualAlertAnxious  Type of Therapy: Individual Therapy  Treatment Goals addressed: Anxiety  Interventions: Strength-based and Supportive  Summary: Jacqueline Simpson is a 21 y.o. female who presents with depression and anxiety.   Suicidal/Homicidal: Nowithout intent/plan  Therapist Response: Pt. Presents with mild anxiety. Pt. Was presented with informed consent and recording for self-hypnosis. Pt. Did not want to engage in use of self-hypnosis. Pt. Reports that she no longer wants to work on anxiety related to driving. Pt. Reports that inability to drive does not currently interfere with her life. Pt. Is currently not working. Pt. Reports that her boyfriend's mother is not a current stressor because she and her boyfriend are spending less time at his house. Pt. Stated that she would like to discontinue counseling at this time.   Plan: Return again in 3 weeks.  Diagnosis:      Axis I: Depressive Disorder NOS                          Axis II: No diagnosis   Nancie Neas, [facility] 09/16/2016

## 2017-01-18 DIAGNOSIS — M2550 Pain in unspecified joint: Secondary | ICD-10-CM | POA: Diagnosis not present

## 2017-01-18 DIAGNOSIS — M352 Behcet's disease: Secondary | ICD-10-CM | POA: Diagnosis not present

## 2017-01-18 DIAGNOSIS — Z79899 Other long term (current) drug therapy: Secondary | ICD-10-CM | POA: Diagnosis not present

## 2017-01-20 DIAGNOSIS — H468 Other optic neuritis: Secondary | ICD-10-CM | POA: Diagnosis not present

## 2017-02-23 ENCOUNTER — Ambulatory Visit: Payer: 59 | Admitting: Neurology

## 2017-02-23 DIAGNOSIS — Z01419 Encounter for gynecological examination (general) (routine) without abnormal findings: Secondary | ICD-10-CM | POA: Diagnosis not present

## 2017-02-27 ENCOUNTER — Encounter: Payer: Self-pay | Admitting: Neurology

## 2017-04-17 ENCOUNTER — Ambulatory Visit: Payer: 59 | Admitting: Neurology

## 2017-04-17 ENCOUNTER — Encounter: Payer: Self-pay | Admitting: Neurology

## 2017-04-17 VITALS — BP 100/70 | HR 82 | Wt 109.2 lb

## 2017-04-17 DIAGNOSIS — M352 Behcet's disease: Secondary | ICD-10-CM | POA: Diagnosis not present

## 2017-04-17 NOTE — Patient Instructions (Signed)
I had a long discussion the patient and her mother regarding her new neuro Behcet's disease which appears to be quite stable. She's not had any recurrent neurological symptoms and MRI scan has been stable. I do not recommend any further neurological testing at the present time. I do not see any point in regular repeat MRI scans unless she has recurrent symptoms She'll continue treatment for Behcet's disease as per her rheumatologist. She may return for follow-up in the future only as necessary and no scheduled appointment was made

## 2017-04-17 NOTE — Progress Notes (Signed)
[facility] [address] [place] [address] [phone]       OFFICE FOLLOW-UP VISIT NOTE  Ms. Jacqueline Simpson Date of Birth:  [birth year] Medical Record Number:  [id number]   Referring MD:  Campbell Lerner Reason for Referral:  neurobehcet`s  HPI: Initial consult 02/23/16 Ms Jacqueline Simpson is a pleasant 82 year Caucasian lady who is seen today to establish neurological follow-up. She is accompanied by her mother. History is obtained from the patient, mother as well as review of referral notes from Dr. Trudie Reed as well as prior neurological clinic visits with Dr Pearletha Forge at [facility] through care everywhere. Ms.Music  is a 21 year old pleasant frail Caucasian lady who has long-standing history of autoimmune disease since 72 months of age when she first developed oral ulcers that proved to be resistant to variety of treatments. At around age 78 she was diagnosed with multiple sclerosis as a child after she had an episode of optic neuritis with temporary loss of vision in the left eye. She was seen initially with dedicated neurologist Dr. Faylene Million and subsequently was followed at [facility] by pediatric neurology as well as rheumatology. There was some mention of possible abnormal EEG and diagnosis of epilepsy but she was never placed on long-term neurologist. She was followed at some point by Dr. Loman Brooklyn a pediatric neurologist who specializes in [place] and practicing on [place], [place].. Patient was tried a variety of medications including Copaxone and CellCept and continued to have intermittent flares during her teenage years which comprised of symptoms like ataxia, dizziness, weakness, fatigue, slurred speech, Bell's palsy with partial resolution with steroids in 48-72 hours. She subsequently developed oral and vaginal ulcers at age 64 and was reported to OB/GYN and skin biopsy was obtained which suggested Behcet's however brain MRI findings are more suggestive of multiple sclerosis with  multiple subcortical and cerebellar white matter lesions. She eventually saw a dermatologist in [place] which specializes in Behcet's and change the diagnosis. She in fact was even tried on TNF inhibitors prior to Remicade, Imuran and Humira which also partially effective. Remicade appeared to work the best for due to concern about a severe allergic reaction after sulfa antibiotics it was discontinued. She has recently been on certolizumab pegol (CIMZIA) for last several years and is often the best control of her symptoms to date. And she is tolerated well without side effects. She now no longer wants to go to [facility] for her care and has shifted her rheumatological care to Dr. Gavin Pound in [place] and is here to establish neurological follow-up. She denies any active neurological symptoms in the form of headache, blurred vision, double vision, vertigo, gait or balance difficulties. Her last MRI scan the brain was in 2016 and and shown stable appearance of a cerebellar and white matter lesions. She also has a history of anxiety and depression is followed at the behavioral health clinic by psychiatrist Dr. Charlcie Cradle.. She has tried Zoloft and Paxil in the past but currently is not on them and she does see a counselor once a month and practices mindfulness. She has recently applied for disability. She is unable to hold a job due to her joint pains and aches. She is high school educated with some courses for college. I do not have any of patient's prior imaging studies to look at today but patient's mother informs me that she has discontinued home and will bring them for my review.  Update 04/17/2017 ; she returns for follow-up after  last visit a year ago. She is accompanied by her mother. She continues to do well and has not had any recurrent neurological symptoms. She states her Behcet's disease has also been quite stable. She remains on injections of CIMZIA every 2 weeks and is tolerating them well  without any side effects. She continues to follow-up with Dr. Gavin Pound in [place] on rheumatology. She had MRI scan of the brain done on 03/23/2016 which I personally reviewed shows stable appearance of the tiny cerebellar and subcortical white matter hyperintensities. There were no enhancing lesions. These are overall unchanged compared with previous MRI dated 10/27/2014. The patient has filed for disability but has been turned down twice. She stated that she cannot be on her feet for long periods of time or walk long distances due to third joint involvement. She continues to take Motrin when necessary and Naprosyn as needed. She has no new neurological complaints    ROS:   14 system review of systems is positive for  joint pain, joint swelling, anxiety, insomnia, depression and all systems negative  PMH:  Past Medical History:  Diagnosis Date  . Anxiety   . Behcet's disease ([place])    hx. brain lesions, mouth and skin ulcers  . Depression   . History of MRSA infection 2015   arms, legs  . History of seizures as a child    due to Behcet's disease - no seizures since age 13  . Laceration of wrist with tendon involvement 86/76/7209   self-inflicted  . Social anxiety disorder     Social History:  Social History   Socioeconomic History  . Marital status: Single    Spouse name: Not on file  . Number of children: Not on file  . Years of education: Not on file  . Highest education level: Not on file  Social Needs  . Financial resource strain: Not on file  . Food insecurity - worry: Not on file  . Food insecurity - inability: Not on file  . Transportation needs - medical: Not on file  . Transportation needs - non-medical: Not on file  Occupational History  . Not on file  Tobacco Use  . Smoking status: Current Every Day Smoker    Years: 0.50    Types: E-cigarettes  . Smokeless tobacco: Never Used  . Tobacco comment: use everyday  Substance and Sexual Activity  . Alcohol  use: Yes    Alcohol/week: 0.6 oz    Types: 1 Glasses of wine per week    Comment: occasionally  . Drug use: Yes    Types: Marijuana    Comment: have not use in a  year  . Sexual activity: Not Currently    Partners: Female, Female  Other Topics Concern  . Not on file  Social History Narrative  . Not on file    Medications:   Current Outpatient Medications on File Prior to Visit  Medication Sig Dispense Refill  . Certolizumab Pegol (CIMZIA PREFILLED) 2 X 200 MG/ML KIT Inject 200 mg into the skin See admin instructions. Every 10 days 1 kit   . Diphenhydramine-APAP, sleep, (TYLENOL PM EXTRA STRENGTH PO) Take by mouth as needed.     Marland Kitchen EPINEPHrine (EPIPEN JR) 0.15 MG/0.3ML injection Inject into the muscle.    . ibuprofen (ADVIL,MOTRIN) 400 MG tablet Take 400 mg by mouth every 6 (six) hours as needed. Reported on 10/15/2015    . LORazepam (ATIVAN) 0.5 MG tablet Take by mouth.    . Multiple  Vitamins-Minerals (MULTIVITAMIN ADULT) CHEW Chew by mouth.    . naproxen (NAPROSYN) 500 MG tablet     . norgestimate-ethinyl estradiol (ORTHO-CYCLEN, 28,) 0.25-35 MG-MCG tablet Take 1 tablet by mouth at bedtime. 1 Package    No current facility-administered medications on file prior to visit.     Allergies:   Allergies  Allergen Reactions  . Colchicine Shortness Of Breath  . Milk-Related Compounds Other (See Comments)    GI UPSET  . Sulfa Antibiotics Hives and Swelling    SWELLING LIPS  . Adhesive [Tape] Other (See Comments)    SKIN IRRITATION  . Soap Itching    Physical Exam General: [place] frail young Caucasian lady, seated, in no evident distress Head: head normocephalic and atraumatic.   Neck: supple with no carotid or supraclavicular bruits Cardiovascular: regular rate and rhythm, no murmurs Musculoskeletal: no deformity Skin:  no rash/petichiae Vascular:  Normal pulses all extremities  Neurologic Exam Mental Status: Awake and fully alert. Oriented to place and time. Recent and remote  memory intact. Attention span, concentration and fund of knowledge appropriate. Mood and affect appropriate.  Cranial Nerves: Fundoscopic exam reveals sharp disc margins and slight temporal pallor in the left. Left pupil is sluggishly reactive and right reacts better. No clear afferent pupillary defect. Vision acuity is slightly diminished in the left eye compared to the right.. Pupils equal, briskly reactive to light. Extraocular movements full without nystagmus. Visual fields full to confrontation. Hearing intact. Facial sensation intact. Face, tongue, palate moves normally and symmetrically.  Motor: Normal bulk and tone. Normal strength in all tested extremity muscles. Sensory.: intact to touch , pinprick , position and vibratory sensation.  Coordination: Rapid alternating movements normal in all extremities. Finger-to-nose and heel-to-shin performed accurately bilaterally. Gait and Station: Arises from chair without difficulty. Stance is normal. Gait demonstrates normal stride length and balance . Able to heel, toe and tandem walk without difficulty.  Reflexes: 1+ and symmetric. Toes downgoing.       ASSESSMENT: 72 year Caucasian lady with long-standing history of Behcet's disease with CNS manifestations diagnosed since 2007. She was initially diagnosed with multiple sclerosis as she had optic neuritis and brain lesions at age 76 but the diagnosis was subsequently revised after she saw a Behcet's specialist in [place] as she did not improve with treatment for MS. She has had no active neurological symptoms for several years and her Behcet's appears to be quite stable on Cimzia since 2016 as well.    PLAN: I had a long discussion with the patient and mother regarding her long-standing history of Behcet's syndrome. She has not had any active neurological symptoms for several yearsand MRI scan has been stable. I do not recommend any further neurological testing at the present time. I do not see any  point in regular repeat MRI scans unless she has recurrent symptoms She'll continue treatment for Behcet's disease as per her rheumatologist. She may return for follow-up in the future only as necessary and no scheduled appointment was made  .  Greater than 50% time during this 55mnute  visit for spent on counseling and coordination of care about the Behcet1s disease and neurological manifestations and answering questions.  PAntony Contras MD  [facility] [address] [address] Phone [phone] [phone]: This document was prepared with digital dictation and possible smart phrase technology. Any transcriptional errors that result from this process are unintentional.

## 2017-05-19 DIAGNOSIS — Z0289 Encounter for other administrative examinations: Secondary | ICD-10-CM

## 2017-06-15 ENCOUNTER — Ambulatory Visit (INDEPENDENT_AMBULATORY_CARE_PROVIDER_SITE_OTHER): Payer: 59 | Admitting: Psychiatry

## 2017-06-15 ENCOUNTER — Encounter (HOSPITAL_COMMUNITY): Payer: Self-pay | Admitting: Psychiatry

## 2017-06-15 VITALS — BP 114/83 | HR 108 | Ht 59.0 in | Wt 105.0 lb

## 2017-06-15 DIAGNOSIS — Z818 Family history of other mental and behavioral disorders: Secondary | ICD-10-CM

## 2017-06-15 DIAGNOSIS — R450 Nervousness: Secondary | ICD-10-CM | POA: Diagnosis not present

## 2017-06-15 DIAGNOSIS — F17210 Nicotine dependence, cigarettes, uncomplicated: Secondary | ICD-10-CM

## 2017-06-15 DIAGNOSIS — F419 Anxiety disorder, unspecified: Secondary | ICD-10-CM | POA: Diagnosis not present

## 2017-06-15 DIAGNOSIS — F1211 Cannabis abuse, in remission: Secondary | ICD-10-CM | POA: Diagnosis not present

## 2017-06-15 DIAGNOSIS — F1099 Alcohol use, unspecified with unspecified alcohol-induced disorder: Secondary | ICD-10-CM

## 2017-06-15 DIAGNOSIS — F330 Major depressive disorder, recurrent, mild: Secondary | ICD-10-CM

## 2017-06-15 DIAGNOSIS — F4010 Social phobia, unspecified: Secondary | ICD-10-CM

## 2017-06-15 MED ORDER — LORAZEPAM 0.5 MG PO TABS: 0.5000 mg | ORAL_TABLET | Freq: Every day | ORAL | 1 refills | Status: DC | PRN

## 2017-06-15 MED ORDER — PAROXETINE HCL 20 MG PO TABS: 20.0000 mg | ORAL_TABLET | Freq: Every day | ORAL | 1 refills | Status: DC

## 2017-06-15 NOTE — Progress Notes (Signed)
[facility] MD/PA/NP OP Progress Note  06/15/2017 3:22 PM Jacqueline Simpson  MRN:  [id number]  Chief Complaint:  Chief Complaint    Depression; Anxiety     HPI: Pt last seen June 2017.   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   Pt is having a lot of social anxiety. A lot of times she doesn't want to get out. Pt is always worried about saying the wrong thing or worried about what people think of her. She spends a lot of her time doing hobbies or stuff around the house. The only person outside her family she spends time with is her boyfriend. He is only the person she hasn't felt anxiety with. Pt is still anxious with her friends. Pt was taking Ativan on/off over the last several months. It was helping but she ran out a few months ago. Pt is worried about restarting Paxil.   Depression is mild. She will get upset that she has anxiety and it leads to depression. Pt denies anhedonia. Sleep, appetite and energy are good. Pt denies hopelessness. Pt denies SI/HI.  Visit Diagnosis:    ICD-10-CM   1. Social anxiety disorder F40.10 LORazepam (ATIVAN) 0.5 MG tablet    PARoxetine (PAXIL) 20 MG tablet  2. MDD (major depressive disorder), recurrent episode, mild (HCC) F33.0 PARoxetine (PAXIL) 20 MG tablet      Past Psychiatric History:  Past Psychiatric History/Hospitalization(s): Anxiety: Yes Bipolar Disorder: No Depression: Yes Mania: No Psychosis: No Schizophrenia: No Personality Disorder: No Hospitalization for psychiatric illness: Yes History of Electroconvulsive Shock Therapy: No Prior Suicide Attempts: Yes   Past Medical History:  Past Medical History:  Diagnosis Date  . Anxiety   . Behcet's disease ([place])    hx. brain lesions, mouth and skin ulcers  . Depression   . History of MRSA infection 2015   arms, legs  . History of seizures as a child    due to Behcet's disease - no seizures since age 3  . Laceration of wrist with tendon involvement 21/30/8657   self-inflicted  . Social anxiety disorder     Past Surgical History:  Procedure Laterality Date  . ARTERY AND TENDON REPAIR Left 03/04/2014   Procedure: ARTERY AND TENDON REPAIR;  Surgeon: Leanora Cover, MD;  Location: [facility];  Service: Orthopedics;  Laterality: Left;  . LUMBAR PUNCTURE  04/28/2003   under anesthesia  . WOUND EXPLORATION Left 03/04/2014   Procedure: LEFT WRIST EXPLORATION;  Surgeon: Leanora Cover, MD;  Location: [facility];  Service: Orthopedics;  Laterality: Left;    Family Psychiatric History:  Family History  Problem Relation Age of Onset  . Depression Mother   . Graves' disease Mother   . ADD / ADHD Brother   . OCD Brother   . Bipolar disorder Maternal Uncle     Social History:  Social History   Socioeconomic History  . Marital status: Single    Spouse name: None  . Number of children: None  . Years of education: None  . Highest education level: None  Social Needs  . Financial resource strain: None  . Food insecurity - worry: None  . Food insecurity - inability: None  . Transportation needs - medical: None  . Transportation needs - non-medical: None  Occupational History  . None  Tobacco Use  . Smoking status: Current Every Day Smoker    Years: 0.50    Types: E-cigarettes  . Smokeless tobacco: Never Used  .  Tobacco comment: use everyday  Substance and Sexual Activity  . Alcohol use: Yes    Alcohol/week: 0.6 oz    Types: 1 Glasses of wine per week    Comment: occasionally  . Drug use: Yes    Types: Marijuana    Comment: have not use in a  year  . Sexual activity: Not Currently    Partners: Female, Female  Other Topics Concern  . None  Social History Narrative  . None    Allergies:  Allergies  Allergen Reactions  . Colchicine Shortness Of Breath  . Milk-Related Compounds Other (See Comments)    GI UPSET  . Sulfa Antibiotics Hives and Swelling    SWELLING LIPS  . Adhesive [Tape] Other (See Comments)     SKIN IRRITATION  . Soap Itching    Metabolic Disorder Labs: No results found for: HGBA1C, MPG No results found for: PROLACTIN No results found for: CHOL, TRIG, HDL, CHOLHDL, VLDL, LDLCALC No results found for: TSH  Therapeutic Level Labs: No results found for: LITHIUM No results found for: VALPROATE No components found for:  CBMZ  Current Medications: Current Outpatient Medications  Medication Sig Dispense Refill  . Certolizumab Pegol (CIMZIA PREFILLED) 2 X 200 MG/ML KIT Inject 200 mg into the skin See admin instructions. Every 10 days 1 kit   . EPINEPHrine (EPIPEN JR) 0.15 MG/0.3ML injection Inject into the muscle.    . ibuprofen (ADVIL,MOTRIN) 400 MG tablet Take 400 mg by mouth every 6 (six) hours as needed. Reported on 10/15/2015    . naproxen (NAPROSYN) 500 MG tablet     . norgestimate-ethinyl estradiol (ORTHO-CYCLEN, 28,) 0.25-35 MG-MCG tablet Take 1 tablet by mouth at bedtime. 1 Package   . Diphenhydramine-APAP, sleep, (TYLENOL PM EXTRA STRENGTH PO) Take by mouth as needed.     Marland Kitchen LORazepam (ATIVAN) 0.5 MG tablet Take by mouth.    . Multiple Vitamins-Minerals (MULTIVITAMIN ADULT) CHEW Chew by mouth.     No current facility-administered medications for this visit.      Musculoskeletal: Strength & Muscle Tone: within normal limits Gait & Station: normal Patient leans: N/A  Psychiatric Specialty Exam: Review of Systems  Constitutional: Negative for chills, fever and malaise/fatigue.  Neurological: Negative for weakness.  Psychiatric/Behavioral: Positive for depression. Negative for hallucinations, substance abuse and suicidal ideas. The patient is nervous/anxious. The patient does not have insomnia.     Blood pressure 114/83, pulse (!) 108, height 4' 11"  (1.499 m), weight 105 lb (47.6 kg).Body mass index is 21.21 kg/m.  General Appearance: Fairly Groomed  Eye Contact:  Good  Speech:  Clear and Coherent and Normal Rate  Volume:  Normal  Mood:  Anxious  Affect:   Congruent  Thought Process:  Goal Directed and Descriptions of Associations: Intact  Orientation:  Full (Time, Place, and Person)  Thought Content: Logical   Suicidal Thoughts:  No  Homicidal Thoughts:  No  Memory:  Immediate;   Good Recent;   Good Remote;   Good  Judgement:  Good  Insight:  Good  Psychomotor Activity:  Normal  Concentration:  Concentration: Good and Attention Span: Good  Recall:  Good  Fund of Knowledge: Good  Language: Good  Akathisia:  No  Handed:  Right  AIMS (if indicated): not done  Assets:  Communication Skills Desire for Improvement Housing Intimacy Leisure Time Social Support Talents/Skills Transportation  ADL's:  Intact  Cognition: WNL  Sleep:  Good   Screenings:   Assessment and Plan: MDD-moderate, recurrent; social anxiety disorder  Medication management with supportive therapy. Risks/benefits and SE of the medication discussed. Pt verbalized understanding and verbal consent obtained for treatment.  Affirm with the patient that the medications are taken as ordered. Patient expressed understanding of how their medications were to be used.   The risk of un-intended pregnancy is low based on the fact that pt reports she is taking a daily OCP. Pt is aware that these meds carry a teratogenic risk. Pt will discuss plan of action if she does or plans to become pregnant in the future.   Meds:  Start trial of Paxil 74m po qHS for MDD and social anxiety disorder Start trial of Ativan 0.560mpo qD prn anxiety   Labs: none  Therapy: brief supportive therapy provided. Discussed psychosocial stressors in detail.     Consultations: referred for therapy  Pt denies SI and is at an acute low risk for suicide. Patient told to call clinic if any problems occur. Patient advised to go to ER if they should develop SI/HI, side effects, or if symptoms worsen. Has crisis numbers to call if needed. Pt verbalized understanding.  F/up in 2 months or sooner if  needed    SaCharlcie CradleMD 06/15/2017, 3:22 PM

## 2017-07-19 DIAGNOSIS — M2550 Pain in unspecified joint: Secondary | ICD-10-CM | POA: Diagnosis not present

## 2017-07-19 DIAGNOSIS — Z79899 Other long term (current) drug therapy: Secondary | ICD-10-CM | POA: Diagnosis not present

## 2017-07-19 DIAGNOSIS — M352 Behcet's disease: Secondary | ICD-10-CM | POA: Diagnosis not present

## 2017-08-17 ENCOUNTER — Encounter (HOSPITAL_COMMUNITY): Payer: Self-pay | Admitting: Psychiatry

## 2017-08-17 ENCOUNTER — Ambulatory Visit (INDEPENDENT_AMBULATORY_CARE_PROVIDER_SITE_OTHER): Payer: 59 | Admitting: Psychiatry

## 2017-08-17 DIAGNOSIS — Z818 Family history of other mental and behavioral disorders: Secondary | ICD-10-CM

## 2017-08-17 DIAGNOSIS — F419 Anxiety disorder, unspecified: Secondary | ICD-10-CM | POA: Diagnosis not present

## 2017-08-17 DIAGNOSIS — F4010 Social phobia, unspecified: Secondary | ICD-10-CM

## 2017-08-17 DIAGNOSIS — F330 Major depressive disorder, recurrent, mild: Secondary | ICD-10-CM | POA: Diagnosis not present

## 2017-08-17 DIAGNOSIS — F17210 Nicotine dependence, cigarettes, uncomplicated: Secondary | ICD-10-CM

## 2017-08-17 DIAGNOSIS — R450 Nervousness: Secondary | ICD-10-CM

## 2017-08-17 DIAGNOSIS — Z560 Unemployment, unspecified: Secondary | ICD-10-CM

## 2017-08-17 MED ORDER — LORAZEPAM 1 MG PO TABS: 1.0000 mg | ORAL_TABLET | Freq: Every day | ORAL | 1 refills | Status: DC | PRN

## 2017-08-17 NOTE — Progress Notes (Signed)
[facility] MD/PA/NP OP Progress Note  08/17/2017 10:00 AM Jacqueline Simpson  MRN:  [id number]  Chief Complaint:  Chief Complaint    Depression; Anxiety; Follow-up     HPI: Pt only took Paxil for 3 days and then stopped due to excessive yawning. That resolved after stopping it.  Pt states she doesn't have much depression. Pt feels down about 1 day a week and will stress about everything and feel sad. Pt remains active and will work on her hobbies that day. It usually goes away the next day. Pt denies anhedonia and isolation. Sleep and energy are good. Pt denies SI/HI.   Anxiety is worse than depression. She feels nervous about "everthing mostly social". Pt takes Ativan rarely. She takes it before going out to social events and it helped a lot. She is anxious at home due to conflict with her mother. Mom often accuses pt of yelling at her. Pt has not been taking Ativan at home. She has no where to go. Pt states Ativan 66m is effective and denies SE.   Pt was denies disability. She [employer]   Visit Diagnosis:    ICD-10-CM   1. Social anxiety disorder F40.10 LORazepam (ATIVAN) 1 MG tablet  2. MDD (major depressive disorder), recurrent episode, mild (HCC) F33.0       Past Psychiatric History:  Past Psychiatric History/Hospitalization(s): Anxiety:Yes Bipolar Disorder:No Depression:Yes Mania:No Psychosis:No Schizophrenia:No Personality Disorder:No Hospitalization for psychiatric illness:Yes History of Electroconvulsive Shock Therapy:No Prior Suicide Attempts:Yes Previous meds- Paxil-excessive yawning  Past Medical History:  Past Medical History:  Diagnosis Date  . Anxiety   . Behcet's disease ([place]    hx. brain lesions, mouth and skin ulcers  . Depression   . History of MRSA infection 2015   arms, legs  . History of seizures as a child    due to Behcet's disease - no seizures since age 22 . Laceration of wrist with tendon  involvement 122/63/3354  self-inflicted  . Social anxiety disorder     Past Surgical History:  Procedure Laterality Date  . ARTERY AND TENDON REPAIR Left 03/04/2014   Procedure: ARTERY AND TENDON REPAIR;  Surgeon: KLeanora Cover MD;  Location: [facility]  Service: Orthopedics;  Laterality: Left;  . LUMBAR PUNCTURE  04/28/2003   under anesthesia  . WOUND EXPLORATION Left 03/04/2014   Procedure: LEFT WRIST EXPLORATION;  Surgeon: KLeanora Cover MD;  Location[place] [facility]  Service: Orthopedics;  Laterality: Left;    Family Psychiatric History:  Family History  Problem Relation Age of Onset  . Depression Mother   . Graves' disease Mother   . ADD / ADHD Brother   . OCD Brother   . Bipolar disorder Maternal Uncle     Social History:  Social History   Socioeconomic History  . Marital status: Single    Spouse name: Not on file  . Number of children: Not on file  . Years of education: Not on file  . Highest education level: Not on file  Occupational History  . Occupation: unemployed  Social Needs  . Financial resource strain: Not on file  . Food insecurity:    Worry: Not on file    Inability: Not on file  . Transportation needs:    Medical: Not on file    Non-medical: Not on file  Tobacco Use  . Smoking status: Current Every Day Smoker    Years: 0.50  Types: E-cigarettes  . Smokeless tobacco: Never Used  . Tobacco comment: use everyday  Substance and Sexual Activity  . Alcohol use: Yes    Alcohol/week: 0.6 oz    Types: 1 Glasses of wine per week    Comment: occasionally 1 drink every few months  . Drug use: Yes    Types: Marijuana    Comment: have not use in over 1  year  . Sexual activity: Not Currently    Partners: Female, Female  Lifestyle  . Physical activity:    Days per week: Not on file    Minutes per session: Not on file  . Stress: Not on file  Relationships  . Social connections:    Talks on phone: Not on file    Gets  together: Not on file    Attends religious service: Not on file    Active member of club or organization: Not on file    Attends meetings of clubs or organizations: Not on file    Relationship status: Not on file  Other Topics Concern  . Not on file  Social History Narrative  . Not on file    Allergies:  Allergies  Allergen Reactions  . Colchicine Shortness Of Breath  . Milk-Related Compounds Other (See Comments)    GI UPSET  . Sulfa Antibiotics Hives and Swelling    SWELLING LIPS  . Adhesive [Tape] Other (See Comments)    SKIN IRRITATION  . Soap Itching    Metabolic Disorder Labs: No results found for: HGBA1C, MPG No results found for: PROLACTIN No results found for: CHOL, TRIG, HDL, CHOLHDL, VLDL, LDLCALC No results found for: TSH  Therapeutic Level Labs: No results found for: LITHIUM No results found for: VALPROATE No components found for:  CBMZ  Current Medications: Current Outpatient Medications  Medication Sig Dispense Refill  . Certolizumab Pegol (CIMZIA PREFILLED) 2 X 200 MG/ML KIT Inject 200 mg into the skin See admin instructions. Every 10 days 1 kit   . Diphenhydramine-APAP, sleep, (TYLENOL PM EXTRA STRENGTH PO) Take by mouth as needed.     Marland Kitchen EPINEPHrine (EPIPEN JR) 0.15 MG/0.3ML injection Inject into the muscle.    . ibuprofen (ADVIL,MOTRIN) 400 MG tablet Take 400 mg by mouth every 6 (six) hours as needed. Reported on 10/15/2015    . LORazepam (ATIVAN) 1 MG tablet Take 1 tablet (1 mg total) by mouth daily as needed for anxiety. 30 tablet 1  . Multiple Vitamins-Minerals (MULTIVITAMIN ADULT) CHEW Chew by mouth.    . naproxen (NAPROSYN) 500 MG tablet     . norgestimate-ethinyl estradiol (ORTHO-CYCLEN, 28,) 0.25-35 MG-MCG tablet Take 1 tablet by mouth at bedtime. 1 Package    No current facility-administered medications for this visit.      Musculoskeletal: Strength & Muscle Tone: within normal limits Gait & Station: normal Patient leans: N/A  Psychiatric  Specialty Exam: Review of Systems  Constitutional: Negative for chills, fever and malaise/fatigue.  Psychiatric/Behavioral: Positive for depression. Negative for hallucinations, substance abuse and suicidal ideas. The patient is nervous/anxious. The patient does not have insomnia.     Blood pressure 106/73, pulse 90, height 4' 11"  (1.499 m), weight 115 lb (52.2 kg), SpO2 99 %.Body mass index is 23.23 kg/m.  General Appearance: Fairly Groomed  Eye Contact:  Good  Speech:  Clear and Coherent and Normal Rate  Volume:  Decreased  Mood:  Anxious  Affect:  Congruent  Thought Process:  Goal Directed and Descriptions of Associations: Intact  Orientation:  Full (  Time, Place, and Person)  Thought Content: Logical   Suicidal Thoughts:  No  Homicidal Thoughts:  No  Memory:  Immediate;   Good Recent;   Good Remote;   Good  Judgement:  Good  Insight:  Good  Psychomotor Activity:  Normal  Concentration:  Concentration: Good and Attention Span: Good  Recall:  Good  Fund of Knowledge: Good  Language: Good  Akathisia:  No  Handed:  Right  AIMS (if indicated): not done  Assets:  Communication Skills Desire for Improvement Housing  ADL's:  Intact  Cognition: WNL  Sleep:  Good   Screenings:  I reviewed the information below on 08/17/2017 and agree except where noted Assessment and Plan: MDD-moderate, recurrent; social anxiety disorder    Medication management with supportive therapy. Risks/benefits and SE of the medication discussed. Pt verbalized understanding and verbal consent obtained for treatment.  Affirm with the patient that the medications are taken as ordered. Patient expressed understanding of how their medications were to be used.   The risk of un-intended pregnancy is low based on the fact that pt reports she is taking a daily OCP. Pt is aware that these meds carry a teratogenic risk. Pt will discuss plan of action if she does or plans to become pregnant in the  future.   Meds:  d/c Paxil   increase Ativan 50m po qD prn anxiety   Labs: none  Therapy: brief supportive therapy provided. Discussed psychosocial stressors in detail.     Consultations: declined therapy  Pt denies SI and is at an acute low risk for suicide. Patient told to call clinic if any problems occur. Patient advised to go to ER if they should develop SI/HI, side effects, or if symptoms worsen. Has crisis numbers to call if needed. Pt verbalized understanding.  F/up in 2 months or sooner if needed     SCharlcie Cradle MD 08/17/2017, 10:00 AM

## 2017-10-19 ENCOUNTER — Ambulatory Visit (HOSPITAL_COMMUNITY): Payer: 59 | Admitting: Psychiatry

## 2017-10-26 ENCOUNTER — Other Ambulatory Visit: Payer: Self-pay

## 2017-10-26 ENCOUNTER — Inpatient Hospital Stay (HOSPITAL_COMMUNITY)
Admission: EM | Admit: 2017-10-26 | Discharge: 2017-10-27 | DRG: 547 | Disposition: A | Discharge status: Home / Self Care | Payer: 59 | Attending: Internal Medicine | Admitting: Internal Medicine

## 2017-10-26 ENCOUNTER — Emergency Department (HOSPITAL_COMMUNITY): Payer: 59

## 2017-10-26 ENCOUNTER — Telehealth: Payer: Self-pay | Admitting: Neurology

## 2017-10-26 ENCOUNTER — Encounter (HOSPITAL_COMMUNITY): Payer: Self-pay | Admitting: *Deleted

## 2017-10-26 DIAGNOSIS — F17290 Nicotine dependence, other tobacco product, uncomplicated: Secondary | ICD-10-CM | POA: Diagnosis present

## 2017-10-26 DIAGNOSIS — Z8614 Personal history of Methicillin resistant Staphylococcus aureus infection: Secondary | ICD-10-CM

## 2017-10-26 DIAGNOSIS — Z882 Allergy status to sulfonamides status: Secondary | ICD-10-CM | POA: Diagnosis not present

## 2017-10-26 DIAGNOSIS — Z818 Family history of other mental and behavioral disorders: Secondary | ICD-10-CM | POA: Diagnosis not present

## 2017-10-26 DIAGNOSIS — Z915 Personal history of self-harm: Secondary | ICD-10-CM | POA: Diagnosis not present

## 2017-10-26 DIAGNOSIS — F419 Anxiety disorder, unspecified: Secondary | ICD-10-CM | POA: Diagnosis present

## 2017-10-26 DIAGNOSIS — Z79899 Other long term (current) drug therapy: Secondary | ICD-10-CM

## 2017-10-26 DIAGNOSIS — R270 Ataxia, unspecified: Secondary | ICD-10-CM

## 2017-10-26 DIAGNOSIS — R233 Spontaneous ecchymoses: Secondary | ICD-10-CM | POA: Diagnosis present

## 2017-10-26 DIAGNOSIS — Z720 Tobacco use: Secondary | ICD-10-CM | POA: Diagnosis not present

## 2017-10-26 DIAGNOSIS — Z91018 Allergy to other foods: Secondary | ICD-10-CM

## 2017-10-26 DIAGNOSIS — M352 Behcet's disease: Secondary | ICD-10-CM | POA: Diagnosis not present

## 2017-10-26 DIAGNOSIS — F329 Major depressive disorder, single episode, unspecified: Secondary | ICD-10-CM | POA: Diagnosis present

## 2017-10-26 DIAGNOSIS — Z888 Allergy status to other drugs, medicaments and biological substances status: Secondary | ICD-10-CM

## 2017-10-26 DIAGNOSIS — Z91048 Other nonmedicinal substance allergy status: Secondary | ICD-10-CM

## 2017-10-26 DIAGNOSIS — F1010 Alcohol abuse, uncomplicated: Secondary | ICD-10-CM | POA: Diagnosis present

## 2017-10-26 DIAGNOSIS — R42 Dizziness and giddiness: Secondary | ICD-10-CM

## 2017-10-26 LAB — BASIC METABOLIC PANEL
Anion gap: 9 (ref 5–15)
BUN: 15 mg/dL (ref 6–20)
CO2: 27 mmol/L (ref 22–32)
Calcium: 9.8 mg/dL (ref 8.9–10.3)
Chloride: 104 mmol/L (ref 101–111)
Creatinine, Ser: 0.69 mg/dL (ref 0.44–1.00)
GFR calc Af Amer: >60 mL/min (ref >60)
GFR calc non Af Amer: >60 mL/min (ref >60)
Glucose, Bld: 93 mg/dL (ref 65–99)
Potassium: 4.4 mmol/L (ref 3.5–5.1)
Sodium: 140 mmol/L (ref 135–145)

## 2017-10-26 LAB — C-REACTIVE PROTEIN: CRP: <0.8 mg/dL (ref <1.0)

## 2017-10-26 LAB — RAPID URINE DRUG SCREEN, HOSP PERFORMED
Amphetamines: NOT DETECTED
Barbiturates: NOT DETECTED
Benzodiazepines: NOT DETECTED
Cocaine: NOT DETECTED
Opiates: NOT DETECTED
Tetrahydrocannabinol: NOT DETECTED

## 2017-10-26 LAB — I-STAT BETA HCG BLOOD, ED (MC, WL, AP ONLY): I-stat hCG, quantitative: <5 m[IU]/mL (ref <5)

## 2017-10-26 LAB — URINALYSIS, ROUTINE W REFLEX MICROSCOPIC
Bacteria, UA: NONE SEEN
Bilirubin Urine: NEGATIVE
Glucose, UA: NEGATIVE mg/dL
Ketones, ur: NEGATIVE mg/dL
Leukocytes, UA: NEGATIVE
Nitrite: NEGATIVE
Protein, ur: NEGATIVE mg/dL
Specific Gravity, Urine: 1.025 (ref 1.005–1.030)
pH: 6 (ref 5.0–8.0)

## 2017-10-26 LAB — CBC
HCT: 41.1 % (ref 36.0–46.0)
Hemoglobin: 13.2 g/dL (ref 12.0–15.0)
MCH: 27.2 pg (ref 26.0–34.0)
MCHC: 32.1 g/dL (ref 30.0–36.0)
MCV: 84.6 fL (ref 78.0–100.0)
Platelets: 236 10*3/uL (ref 150–400)
RBC: 4.86 MIL/uL (ref 3.87–5.11)
RDW: 13.2 % (ref 11.5–15.5)
WBC: 6.2 10*3/uL (ref 4.0–10.5)

## 2017-10-26 LAB — SEDIMENTATION RATE: Sed Rate: 20 mm/hr (ref 0–22)

## 2017-10-26 MED ORDER — SODIUM CHLORIDE 0.9 % IV SOLN
1000.0000 mg | Freq: Once | INTRAVENOUS | Status: AC
  Administered 2017-10-26: 1000 mg via INTRAVENOUS
  Filled 2017-10-26: qty 8

## 2017-10-26 MED ORDER — ENOXAPARIN SODIUM 40 MG/0.4ML ~~LOC~~ SOLN
40.0000 mg | SUBCUTANEOUS | Status: DC
  Filled 2017-10-26 (×2): qty 0.4

## 2017-10-26 MED ORDER — LORAZEPAM 2 MG/ML IJ SOLN: 0.0000 mg | Freq: Two times a day (BID) | INTRAMUSCULAR | Status: DC

## 2017-10-26 MED ORDER — LORAZEPAM 1 MG PO TABS: 1.0000 mg | ORAL_TABLET | Freq: Four times a day (QID) | ORAL | Status: DC | PRN

## 2017-10-26 MED ORDER — ONDANSETRON HCL 4 MG/2ML IJ SOLN: 4.0000 mg | Freq: Four times a day (QID) | INTRAMUSCULAR | Status: DC | PRN

## 2017-10-26 MED ORDER — EPINEPHRINE (ANAPHYLAXIS) 1 MG/ML IJ SOLN
0.1500 mg | INTRAMUSCULAR | Status: DC | PRN
  Filled 2017-10-26: qty 0.15

## 2017-10-26 MED ORDER — SENNOSIDES-DOCUSATE SODIUM 8.6-50 MG PO TABS: 1.0000 | ORAL_TABLET | Freq: Every evening | ORAL | Status: DC | PRN

## 2017-10-26 MED ORDER — NORGESTIMATE-ETH ESTRADIOL 0.25-35 MG-MCG PO TABS: 1.0000 | ORAL_TABLET | Freq: Every day | ORAL | Status: DC

## 2017-10-26 MED ORDER — ONDANSETRON HCL 4 MG PO TABS
4.0000 mg | ORAL_TABLET | Freq: Four times a day (QID) | ORAL | Status: DC | PRN
Start: 2017-10-26 — End: 2017-10-27

## 2017-10-26 MED ORDER — NICOTINE 7 MG/24HR TD PT24
7.0000 mg | MEDICATED_PATCH | Freq: Every day | TRANSDERMAL | Status: DC
  Administered 2017-10-26 – 2017-10-27 (×2): 7 mg via TRANSDERMAL
  Filled 2017-10-26 (×2): qty 1

## 2017-10-26 MED ORDER — ADULT MULTIVITAMIN W/MINERALS CH
1.0000 | ORAL_TABLET | Freq: Every day | ORAL | Status: DC
  Administered 2017-10-26 – 2017-10-27 (×2): 1 via ORAL
  Filled 2017-10-26 (×2): qty 1

## 2017-10-26 MED ORDER — SODIUM CHLORIDE 0.9 % IV BOLUS
1000.0000 mL | Freq: Once | INTRAVENOUS | Status: AC
  Administered 2017-10-26: 1000 mL via INTRAVENOUS

## 2017-10-26 MED ORDER — FOLIC ACID 1 MG PO TABS
1.0000 mg | ORAL_TABLET | Freq: Every day | ORAL | Status: DC
  Administered 2017-10-26 – 2017-10-27 (×2): 1 mg via ORAL
  Filled 2017-10-26 (×2): qty 1

## 2017-10-26 MED ORDER — PANTOPRAZOLE SODIUM 40 MG PO TBEC
40.0000 mg | DELAYED_RELEASE_TABLET | Freq: Every day | ORAL | Status: DC
  Administered 2017-10-26 – 2017-10-27 (×2): 40 mg via ORAL
  Filled 2017-10-26 (×2): qty 1

## 2017-10-26 MED ORDER — GADOBENATE DIMEGLUMINE 529 MG/ML IV SOLN
15.0000 mL | Freq: Once | INTRAVENOUS | Status: AC | PRN
Start: 2017-10-26 — End: 2017-10-26
  Administered 2017-10-26: 11 mL via INTRAVENOUS

## 2017-10-26 MED ORDER — SODIUM CHLORIDE 0.9 % IV SOLN
INTRAVENOUS | Status: DC
  Administered 2017-10-26 – 2017-10-27 (×2): via INTRAVENOUS

## 2017-10-26 MED ORDER — SODIUM CHLORIDE 0.9 % IV SOLN
1000.0000 mg | Freq: Once | INTRAVENOUS | Status: AC
  Administered 2017-10-27: 1000 mg via INTRAVENOUS
  Filled 2017-10-26: qty 8

## 2017-10-26 MED ORDER — ZOLPIDEM TARTRATE 5 MG PO TABS
5.0000 mg | ORAL_TABLET | Freq: Every evening | ORAL | Status: DC | PRN
  Filled 2017-10-26: qty 1

## 2017-10-26 MED ORDER — LORAZEPAM 2 MG/ML IJ SOLN
0.5000 mg | Freq: Once | INTRAMUSCULAR | Status: AC
  Administered 2017-10-26: 0.5 mg via INTRAVENOUS
  Filled 2017-10-26: qty 1

## 2017-10-26 MED ORDER — LORAZEPAM 1 MG PO TABS
1.0000 mg | ORAL_TABLET | Freq: Every day | ORAL | Status: DC | PRN
Start: 2017-10-26 — End: 2017-10-27
  Administered 2017-10-26: 1 mg via ORAL
  Filled 2017-10-26: qty 1

## 2017-10-26 MED ORDER — ACETAMINOPHEN 650 MG RE SUPP: 650.0000 mg | Freq: Four times a day (QID) | RECTAL | Status: DC | PRN

## 2017-10-26 MED ORDER — ACETAMINOPHEN 325 MG PO TABS: 650.0000 mg | ORAL_TABLET | Freq: Four times a day (QID) | ORAL | Status: DC | PRN

## 2017-10-26 MED ORDER — VITAMIN B-1 100 MG PO TABS
100.0000 mg | ORAL_TABLET | Freq: Every day | ORAL | Status: DC
  Administered 2017-10-27: 100 mg via ORAL
  Filled 2017-10-26 (×2): qty 1

## 2017-10-26 MED ORDER — LORAZEPAM 2 MG/ML IJ SOLN: 1.0000 mg | Freq: Four times a day (QID) | INTRAMUSCULAR | Status: DC | PRN

## 2017-10-26 MED ORDER — LORAZEPAM 2 MG/ML IJ SOLN
0.0000 mg | Freq: Four times a day (QID) | INTRAMUSCULAR | Status: DC
Start: 2017-10-26 — End: 2017-10-27

## 2017-10-26 MED ORDER — NICOTINE 21 MG/24HR TD PT24
21.0000 mg | MEDICATED_PATCH | Freq: Every day | TRANSDERMAL | Status: DC
  Administered 2017-10-26: 21 mg via TRANSDERMAL
  Filled 2017-10-26: qty 1

## 2017-10-26 MED ORDER — THIAMINE HCL 100 MG/ML IJ SOLN
100.0000 mg | Freq: Every day | INTRAMUSCULAR | Status: DC
  Administered 2017-10-26: 100 mg via INTRAVENOUS
  Filled 2017-10-26 (×2): qty 2

## 2017-10-26 NOTE — ED Notes (Signed)
ED Provider at bedside. 

## 2017-10-26 NOTE — ED Provider Notes (Signed)
[facility] EMERGENCY DEPARTMENT Provider Note   CSN: [id number] Arrival date & time: 10/26/17  0041     History   Chief Complaint Chief Complaint  Patient presents with  . Dizziness    HPI Jacqueline Simpson is a 22 y.o. female.  Patient with a history of Behcet's Dz presents with dizziness that started while at rest last night. She describes dizziness as surroundings in motion. No near syncope. She had a headache at first that resolved spontaneously. No recent fever, new medications or changes to current medication dosing. No URI symptoms, cough, N, V, D. She reports she has been eating and drinking per her usual. No urinary symptoms. She states that she experienced similar symptoms when she had a Behcet's exacerbation in 2012. Per mom, Her symptoms can be cutaneous lesions that affect her fingers, intraoral surfaces, genital lesions and when she had dizziness diagnosed as Behcet's flare up, she had brain lesions seen on MRI. She denies any cutaneous lesions/sores now.   The history is provided by the patient and a parent. No language interpreter was used.  Dizziness  Associated symptoms: headaches   Associated symptoms: no weakness     Past Medical History:  Diagnosis Date  . Anxiety   . Behcet's disease ([place])    hx. brain lesions, mouth and skin ulcers  . Depression   . History of MRSA infection 2015   arms, legs  . History of seizures as a child    due to Behcet's disease - no seizures since age 56  . Laceration of wrist with tendon involvement 91/69/4503   self-inflicted  . Social anxiety disorder     Patient Active Problem List   Diagnosis Date Noted  . Behcet's disease ([place]) 02/23/2016  . H/O optic neuritis 02/23/2016  . Social anxiety disorder 05/15/2014  . Alcohol abuse 03/01/2014  . Abscess of right groin 03/08/2013  . MDD (major depressive disorder), single episode, moderate ([place]) 04/18/2012    Past Surgical History:  Procedure  Laterality Date  . ARTERY AND TENDON REPAIR Left 03/04/2014   Procedure: ARTERY AND TENDON REPAIR;  Surgeon: Leanora Cover, MD;  Location: [facility];  Service: Orthopedics;  Laterality: Left;  . LUMBAR PUNCTURE  04/28/2003   under anesthesia  . WOUND EXPLORATION Left 03/04/2014   Procedure: LEFT WRIST EXPLORATION;  Surgeon: Leanora Cover, MD;  Location: [facility];  Service: Orthopedics;  Laterality: Left;     OB History   None      Home Medications    Prior to Admission medications   Medication Sig Start Date End Date Taking? Authorizing Provider  Certolizumab Pegol (CIMZIA PREFILLED) 2 X 200 MG/ML KIT Inject 200 mg into the skin See admin instructions. Every 10 days Patient taking differently: Inject 200 mg into the skin every 14 (fourteen) days.  03/01/14  Yes Lord, Asa Saunas, NP  EPINEPHrine (EPIPEN JR) 0.15 MG/0.3ML injection Inject 0.15 mg into the muscle as needed for anaphylaxis.    Yes [provider]  LORazepam (ATIVAN) 1 MG tablet Take 1 tablet (1 mg total) by mouth daily as needed for anxiety. 08/17/17  Yes Charlcie Cradle, MD  norgestimate-ethinyl estradiol (ORTHO-CYCLEN, 28,) 0.25-35 MG-MCG tablet Take 1 tablet by mouth at bedtime. 03/01/14  Yes Patrecia Pour, NP    Family History Family History  Problem Relation Age of Onset  . Depression Mother   . Graves' disease Mother   . ADD / ADHD Brother   .  OCD Brother   . Bipolar disorder Maternal Uncle     Social History Social History   Tobacco Use  . Smoking status: Current Every Day Smoker    Years: 0.50    Types: E-cigarettes  . Smokeless tobacco: Never Used  . Tobacco comment: use everyday  Substance Use Topics  . Alcohol use: Yes    Alcohol/week: 0.6 oz    Types: 1 Glasses of wine per week    Comment: occasionally 1 drink every few months  . Drug use: Yes    Types: Marijuana    Comment: have not use in over 1  year     Allergies   Colchicine; Milk-related  compounds; Sulfa antibiotics; Adhesive [tape]; and Soap   Review of Systems Review of Systems  Constitutional: Negative for chills and fever.  HENT: Negative.   Respiratory: Negative.   Cardiovascular: Negative.   Gastrointestinal: Negative.   Musculoskeletal: Negative.   Skin: Negative.  Negative for wound.  Neurological: Positive for dizziness and headaches. Negative for syncope, weakness and numbness.     Physical Exam Updated Vital Signs BP 116/81   Pulse 87   Temp 98.4 F (36.9 C) (Oral)   Resp 15   LMP 10/22/2017   SpO2 97%   Physical Exam  Constitutional: She is oriented to person, place, and time. She appears well-developed and well-nourished.  HENT:  Head: Normocephalic.  Neck: Normal range of motion. Neck supple.  Cardiovascular: Normal rate and regular rhythm.  Pulmonary/Chest: Effort normal and breath sounds normal.  Abdominal: Soft. Bowel sounds are normal. There is no tenderness. There is no rebound and no guarding.  Musculoskeletal: Normal range of motion.  Neurological: She is alert and oriented to person, place, and time. She has normal strength and normal reflexes. No cranial nerve deficit or sensory deficit. She displays a negative Romberg sign. Coordination normal. GCS eye subscore is 4. GCS verbal subscore is 5. GCS motor subscore is 6.  Skin: Skin is warm and dry. No rash noted.  Psychiatric: She has a normal mood and affect.     ED Treatments / Results  Labs (all labs ordered are listed, but only abnormal results are displayed) Labs Reviewed  BASIC METABOLIC PANEL  CBC  URINALYSIS, ROUTINE W REFLEX MICROSCOPIC  I-STAT BETA HCG BLOOD, ED ([facility], [facility], [facility] ONLY)   Results for orders placed or performed during the hospital encounter of 67/59/16  Basic metabolic panel  Result Value Ref Range   Sodium 140 135 - 145 mmol/L   Potassium 4.4 3.5 - 5.1 mmol/L   Chloride 104 101 - 111 mmol/L   CO2 27 22 - 32 mmol/L   Glucose, Bld 93 65 - 99 mg/dL   BUN  15 6 - 20 mg/dL   Creatinine, Ser 0.69 0.44 - 1.00 mg/dL   Calcium 9.8 8.9 - 10.3 mg/dL   GFR calc non Af Amer >60 >60 mL/min   GFR calc Af Amer >60 >60 mL/min   Anion gap 9 5 - 15  CBC  Result Value Ref Range   WBC 6.2 4.0 - 10.5 K/uL   RBC 4.86 3.87 - 5.11 MIL/uL   Hemoglobin 13.2 12.0 - 15.0 g/dL   HCT 41.1 36.0 - 46.0 %   MCV 84.6 78.0 - 100.0 fL   MCH 27.2 26.0 - 34.0 pg   MCHC 32.1 30.0 - 36.0 g/dL   RDW 13.2 11.5 - 15.5 %   Platelets 236 150 - 400 K/uL  Urinalysis, Routine w reflex  microscopic  Result Value Ref Range   Color, Urine YELLOW YELLOW   APPearance CLEAR CLEAR   Specific Gravity, Urine 1.025 1.005 - 1.030   pH 6.0 5.0 - 8.0   Glucose, UA NEGATIVE NEGATIVE mg/dL   Hgb urine dipstick SMALL (A) NEGATIVE   Bilirubin Urine NEGATIVE NEGATIVE   Ketones, ur NEGATIVE NEGATIVE mg/dL   Protein, ur NEGATIVE NEGATIVE mg/dL   Nitrite NEGATIVE NEGATIVE   Leukocytes, UA NEGATIVE NEGATIVE   RBC / HPF 0-5 0 - 5 RBC/hpf   WBC, UA 0-5 0 - 5 WBC/hpf   Bacteria, UA NONE SEEN NONE SEEN   Squamous Epithelial / LPF 0-5 0 - 5   Mucus PRESENT   I-Stat beta hCG blood, ED  Result Value Ref Range   I-stat hCG, quantitative <5.0 <5 mIU/mL   Comment 3            EKG EKG Interpretation  Date/Time:  Thursday October 26 2017 02:49:17 EDT Ventricular Rate:  87 PR Interval:    QRS Duration: 90 QT Interval:  376 QTC Calculation: 453 R Axis:   61 Text Interpretation:  Sinus rhythm Interpretation limited secondary to artifact Otherwise no significant change Confirmed by Ripley Fraise [phone]) on 10/26/2017 3:10:01 AM   Radiology No results found.  Procedures Procedures (including critical care time)  Medications Ordered in ED Medications  sodium chloride 0.9 % bolus 1,000 mL (has no administration in time range)     Initial Impression / Assessment and Plan / ED Course  I have reviewed the triage vital signs and the nursing notes.  Pertinent labs & imaging results that were  available during my care of the patient were reviewed by me and considered in my medical decision making (see chart for details).     Patient with history of Behcet's disease presents with dizziness similar to presenting symptoms with last exacerbation in 2012. She denies pain, fever, N, V or URI symptoms.   Discussed with Dr. Leonel Ramsay (neuro) who advises MR brain w/wo CM. IV solumedrol 1 gm given also per his recommendation. She will require admission for treatment of neuro-Behcet's presentation.   Discussed with dr. Blaine Hamper who accepts the patient onto his service.   Final Clinical Impressions(s) / ED Diagnoses   Final diagnoses:  None   1. Dizziness 2. History of Behcet's disease  ED Discharge Orders    None       Charlann Lange, Hershal Coria 10/26/17 0093    Ripley Fraise, MD 10/26/17 [phone]

## 2017-10-26 NOTE — ED Triage Notes (Addendum)
Pt has Behcet's disease; per mother, pt has dizziness when pt has flareups. Reports dizziness started tonight, denies pain. Did have a headache yesterday that has resolved

## 2017-10-26 NOTE — Consult Note (Addendum)
Neurology Consultation  Reason for Consult: vertigo Referring Physician: Dr. Maryland Pink  CC: Dizziness, slurred speech, generalized weakness, ataxia  History is obtained from:Patient, chart, patient's parents at bedside  HPI: Jacqueline Simpson is a 22 y.o. female PMH of Neurobehcet's, anxiety, depression, presenting to the emergency room from home for evaluation of dizziness, generalized weakness and gait instability. She says she was in her usual state of health up until yesterday when she started having this feeling of dizziness which she describes as room spinning that quickly progressed to feeling of instability. She has not had these symptoms for many years with the last episode being in 2011, when she received some steroids and that helped. She has had an extensive work-up done, from a very young age for oral genital ulcers and has biopsy-proven Behcet's disease and after multiple rheumatological and neurological consultations, a diagnosis of neuro Behcet's. She called her neurologist at [facility] neurology, and spoke with on-call doctor Dr. [name] Shelling, who recommended that she come in and get 2 days of high-dose IV steroids followed by a taper. She denies any preceding illnesses but reports that she has been had a runny nose for the past few days. She denies any headaches or visual changes.  Denies any tingling or numbness.  Denies any sensory changes. Denies diplopia or blurred vision.  Denies chest pain but reported feeling a little uncomfortable in her chest after the contrast was given for the MRI in the emergency room, that resolved by the time my encounter was completed. Denies any changes in medications or new medications.  She has in the past, been extensively worked up with pediatric MS, was at some point on medications for MS as well.  Lesions on her MRI are not very typical for MS.  Her brain MRI also is not very typical for would be expected to seen with neuro Behcet's, but that has  been a consensus diagnosis by multiple specialist in the past.  Has got a questionable history of seizures as a child with some abnormal EEG reported per chart review.  No seizures since the age of 22 years old.  ROS: ROS was performed and is negative except as noted in the HPI.  Past Medical History:  Diagnosis Date  . Anxiety   . Behcet's disease ([place])    hx. brain lesions, mouth and skin ulcers  . Depression   . History of MRSA infection 2015   arms, legs  . History of seizures as a child    due to Behcet's disease - no seizures since age 64  . Laceration of wrist with tendon involvement 50/07/7046   self-inflicted  . Social anxiety disorder     Family History  Problem Relation Age of Onset  . Depression Mother   . Graves' disease Mother   . ADD / ADHD Brother   . OCD Brother   . Bipolar disorder Maternal Uncle     Social History:   reports that she has been smoking e-cigarettes.  She has smoked for the past 0.50 years. She has never used smokeless tobacco. She reports that she drinks about 0.6 oz of alcohol per week. She reports that she has current or past drug history. Drug: Marijuana.  Medications  Current Facility-Administered Medications:  .  acetaminophen (TYLENOL) tablet 650 mg, 650 mg, Oral, Q6H PRN **OR** acetaminophen (TYLENOL) suppository 650 mg, 650 mg, Rectal, Q6H PRN, Ivor Costa, MD .  enoxaparin (LOVENOX) injection 40 mg, 40 mg, Subcutaneous, Q24H, Ivor Costa, MD .  EPINEPHrine (Anaphylaxis) SOLN 0.15 mg, 0.15 mg, Intramuscular, PRN, Ivor Costa, MD .  folic acid (FOLVITE) tablet 1 mg, 1 mg, Oral, Daily, Ivor Costa, MD .  LORazepam (ATIVAN) injection 0-4 mg, 0-4 mg, Intravenous, Q6H **FOLLOWED BY** [START ON 10/28/2017] LORazepam (ATIVAN) injection 0-4 mg, 0-4 mg, Intravenous, Q12H, Ivor Costa, MD .  LORazepam (ATIVAN) tablet 1 mg, 1 mg, Oral, Q6H PRN **OR** LORazepam (ATIVAN) injection 1 mg, 1 mg, Intravenous, Q6H PRN, Ivor Costa, MD .  LORazepam (ATIVAN)  tablet 1 mg, 1 mg, Oral, Daily PRN, Ivor Costa, MD .  multivitamin with minerals tablet 1 tablet, 1 tablet, Oral, Daily, Ivor Costa, MD .  norgestimate-ethinyl estradiol (ORTHO-CYCLEN,SPRINTEC,PREVIFEM) 0.25-35 MG-MCG tablet 1 tablet, 1 tablet, Oral, QHS, Ivor Costa, MD .  ondansetron (ZOFRAN) tablet 4 mg, 4 mg, Oral, Q6H PRN **OR** ondansetron (ZOFRAN) injection 4 mg, 4 mg, Intravenous, Q6H PRN, Ivor Costa, MD .  senna-docusate (Senokot-S) tablet 1 tablet, 1 tablet, Oral, QHS PRN, Ivor Costa, MD .  thiamine (VITAMIN B-1) tablet 100 mg, 100 mg, Oral, Daily **OR** thiamine (B-1) injection 100 mg, 100 mg, Intravenous, Daily, Ivor Costa, MD .  zolpidem (AMBIEN) tablet 5 mg, 5 mg, Oral, QHS PRN, Ivor Costa, MD  Current Outpatient Medications:  .  Certolizumab Pegol (CIMZIA PREFILLED) 2 X 200 MG/ML KIT, Inject 200 mg into the skin See admin instructions. Every 10 days (Patient taking differently: Inject 200 mg into the skin every 14 (fourteen) days. ), Disp: 1 kit, Rfl:  .  EPINEPHrine (EPIPEN JR) 0.15 MG/0.3ML injection, Inject 0.15 mg into the muscle as needed for anaphylaxis. , Disp: , Rfl:  .  LORazepam (ATIVAN) 1 MG tablet, Take 1 tablet (1 mg total) by mouth daily as needed for anxiety., Disp: 30 tablet, Rfl: 1 .  norgestimate-ethinyl estradiol (ORTHO-CYCLEN, 28,) 0.25-35 MG-MCG tablet, Take 1 tablet by mouth at bedtime., Disp: 1 Package, Rfl:   Exam: Current vital signs: BP 109/77   Pulse 83   Temp 98.4 F (36.9 C) (Oral)   Resp 16   LMP 10/22/2017   SpO2 100%  Vital signs in last 24 hours: Temp:  [98.4 F (36.9 C)] 98.4 F (36.9 C) (06/13 0051) Pulse Rate:  [83-100] 83 (06/13 0715) Resp:  [14-20] 16 (06/13 0700) BP: (109-133)/(77-96) 109/77 (06/13 0715) SpO2:  [97 %-100 %] 100 % (06/13 0715)  GENERAL: Awake, alert in NAD HEENT: - Normocephalic and atraumatic, dry mm, no LN++, no Thyromegally LUNGS - Clear to auscultation bilaterally with no wheezes CV - S1S2 RRR, no m/r/g, equal  pulses bilaterally. ABDOMEN - Soft, nontender, nondistended with normoactive BS Ext: Warm, well-perfused, no rashes, no edema  NEURO:  Mental Status: AA&Ox3  Language: speech is non-dysarthric.  Naming, repetition, fluency, and comprehension intact. Cranial Nerves: PERRL. EOMI, visual fields full, no facial asymmetry, facial sensation intact, hearing intact, tongue/uvula/soft palate midline, normal sternocleidomastoid and trapezius muscle strength. No evidence of tongue atrophy or fibrillations Motor: 5/5 all over with no drift Tone: is normal and bulk is normal Sensation- Intact to light touch bilaterally Coordination: FTN intact bilaterally, no ataxia in BLE. Gait- deferred  NIHSS-0   Labs I have reviewed labs in epic and the results pertinent to this consultation are:  CBC    Component Value Date/Time   WBC 6.2 10/26/2017 0107   RBC 4.86 10/26/2017 0107   HGB 13.2 10/26/2017 0107   HCT 41.1 10/26/2017 0107   PLT 236 10/26/2017 0107   MCV 84.6 10/26/2017 0107   MCH 27.2  10/26/2017 0107   MCHC 32.1 10/26/2017 0107   RDW 13.2 10/26/2017 0107   LYMPHSABS 1.8 08/06/2013 1310   MONOABS 0.5 08/06/2013 1310   EOSABS 0.4 08/06/2013 1310   BASOSABS 0.0 08/06/2013 1310    CMP     Component Value Date/Time   NA 140 10/26/2017 0107   K 4.4 10/26/2017 0107   CL 104 10/26/2017 0107   CO2 27 10/26/2017 0107   GLUCOSE 93 10/26/2017 0107   BUN 15 10/26/2017 0107   CREATININE 0.69 10/26/2017 0107   CALCIUM 9.8 10/26/2017 0107   PROT 8.9 (H) 02/28/2014 1230   ALBUMIN 3.9 02/28/2014 1230   AST 18 02/28/2014 1230   ALT 14 02/28/2014 1230   ALKPHOS 46 02/28/2014 1230   BILITOT <0.2 (L) 02/28/2014 1230   GFRNONAA >60 10/26/2017 0107   GFRAA >60 10/26/2017 0107   Imaging I have reviewed the images obtained:  MRI examination of the brain-unchanged white matter lesions from 2017.  Assessment:  22 year old with past medical history of neuro Behcet's, presented to the emergency  room for evaluation of vertigo, ataxia and generalized weakness. Clinical presentation similar to prior flareups, which have not happened in a few years now but concerning enough for a new flareup. Imaging unremarkable for enhancement, but neuro Behcet's acute presentations might not present with contrast enhancement or acute imaging changes unlike MS. Given her prior history and response to steroids, I would recommend completing the steroids as recommended by her outpatient neurologist.  Impression: Possible flareup of neuro Behcet's  Recommendations: IV Solu-Medrol-1000 mg daily for 2 days. This will be followed by an oral steroid taper. I will discuss this case with her outpatient neurologist, Dr. Leonie Man and will recommend appropriate steroid taper as advised. I would recommend using a PPI/H2 blocker to prevent GI upsets while on steroids We will continue to follow with you. I discussed this in detail with the patient, her parents at bedside and answered all the questions.  -- Jacqueline Portland, MD [facility] Pager: [phone] If 7pm to 7am, please call on call as listed on AMION.

## 2017-10-26 NOTE — ED Notes (Signed)
Breakfast tray ordered per MD orders.

## 2017-10-26 NOTE — ED Notes (Signed)
Called 42 to give report.  Latoya states receiving nurse will have to call back.

## 2017-10-26 NOTE — ED Notes (Signed)
Pt is alert and appears comfortable.  Pt denies any complaints at this time.  Mom at bedside.

## 2017-10-26 NOTE — ED Notes (Signed)
Patient transported to dialysis

## 2017-10-26 NOTE — ED Notes (Signed)
Patient transported to MRI 

## 2017-10-26 NOTE — Plan of Care (Signed)
  Problem: Education: Goal: Knowledge of General Education information will improve Outcome: Progressing   Problem: Clinical Measurements: Goal: Ability to maintain clinical measurements within normal limits will improve Outcome: Progressing Goal: Will remain free from infection Outcome: Progressing   Problem: Nutrition: Goal: Adequate nutrition will be maintained Outcome: Progressing   Problem: Coping: Goal: Level of anxiety will decrease Outcome: Progressing

## 2017-10-26 NOTE — ED Notes (Signed)
Walked patient to the bathroom  

## 2017-10-26 NOTE — Progress Notes (Signed)
   Follow Up Note  HPI: 22 year old female with past medical history of neuro Behcet's disease, childhood seizures, alcohol and tobacco abuse and depression and anxiety admitted on the early morning of 6/13 with complaints of dizziness x1 day along with headache and slurred speech.  MRI noted chronic demyelination findings consistent with her Behcet's disease.  Seen by neurology who felt this was likely a flareup of her Behcet's disease and patient started on IV steroids. Pt admitted earlier this morning.  Seen after arrived to floor.    Exam: CV: Regular rate and rhythm, S1-S2 Lungs: Clear to auscultation bilaterally Abd: Soft, nontender, nondistended, positive bowel sounds Ext: No clubbing or cyanosis or edema  Present on Admission: . Alcohol abuse: Monitor for withdrawals . Behcet's disease [facility]): Appreciate neurology assistance.  Day 1/2 IV steroids.  Will have PT get out of bed. . Anxiety: PRN Ativan . Tobacco abuse: Nicotine patch.  Counseled patient   Disposition:: The patient improved, stable, potential discharge tomorrow

## 2017-10-26 NOTE — H&P (Signed)
History and Physical    [name]:[id number] DOB: Jan 18, 1996 DOA: 10/26/2017  Referring MD/NP/PA:   PCP: [facility] @ [place]   Patient coming from:  The patient is coming from home.  At baseline, pt is independent for most of ADL.   Chief Complaint: Dizziness, ataxia, slurred speech, generalized weakness  HPI: Jacqueline Simpson is a 22 y.o. female with medical history significant of Neuro-Behcet's disease, depression, anxiety, seizures childhood, alcohol abuse, E cigarette user, who presents with dizziness, ataxia, slurred speech, generalized weakness.  Pt has hx of Neuro-Behcet's disease, and is followed up with Dr. Leonie Man at [facility]. She started having dizziness, vertigo last night, then quickly progressed to ataxia.  Patient also has generalized weakness, HA and slurred speech.  Patient does not have fever, chills.  No recent viral symptoms.  Denies chest pain, shortness breath, cough.  No GI symptoms of symptoms of UTI. Pt states that her symptoms can be cutaneous lesions or Neuro-symptoms. She has petechial rash in the left arm. She had brain lesions seen on MRI.   ED Course: pt was found to have WBC 6.2, electrolytes renal function okay, negative pregnancy test, negative urinalysis, temperature normal, oxygen saturation 97% on room air.  Patient is admitted to telemetry bed as inpatient. Pending MRI-brain.  Leonel Ramsay of neurology was consulted.  Review of Systems:   General: no fevers, chills, no body weight gain, has fatigue HEENT: no blurry vision, hearing changes or sore throat Respiratory: no dyspnea, coughing, wheezing CV: no chest pain, no palpitations GI: no nausea, vomiting, abdominal pain, diarrhea, constipation GU: no dysuria, burning on urination, increased urinary frequency, hematuria  Ext: no leg edema Neuro: Dizziness, ataxia, slurred speech, generalized weakness Skin: has petechial rash in the left arm. no skin tear. MSK: No  muscle spasm, no deformity, no limitation of range of movement in spin Heme: No easy bruising.  Travel history: No recent long distant travel.  Allergy:  Allergies  Allergen Reactions  . Colchicine Shortness Of Breath  . Milk-Related Compounds Other (See Comments)    GI UPSET  . Sulfa Antibiotics Hives and Swelling    SWELLING LIPS  . Adhesive [Tape] Other (See Comments)    SKIN IRRITATION  . Soap Itching    Past Medical History:  Diagnosis Date  . Anxiety   . Behcet's disease ([place])    hx. brain lesions, mouth and skin ulcers  . Depression   . History of MRSA infection 2015   arms, legs  . History of seizures as a child    due to Behcet's disease - no seizures since age 20  . Laceration of wrist with tendon involvement 97/94/8016   self-inflicted  . Social anxiety disorder     Past Surgical History:  Procedure Laterality Date  . ARTERY AND TENDON REPAIR Left 03/04/2014   Procedure: ARTERY AND TENDON REPAIR;  Surgeon: Leanora Cover, MD;  Location: [place];  Service: Orthopedics;  Laterality: Left;  . LUMBAR PUNCTURE  04/28/2003   under anesthesia  . WOUND EXPLORATION Left 03/04/2014   Procedure: LEFT WRIST EXPLORATION;  Surgeon: Leanora Cover, MD;  Location: [place];  Service: Orthopedics;  Laterality: Left;    Social History:  reports that she has been smoking e-cigarettes.  She has smoked for the past 0.50 years. She has never used smokeless tobacco. She reports that she drinks about 0.6 oz of alcohol per week. She reports that she has current or past drug history.  Drug: Marijuana.  Family History:  Family History  Problem Relation Age of Onset  . Depression Mother   . Graves' disease Mother   . ADD / ADHD Brother   . OCD Brother   . Bipolar disorder Maternal Uncle      Prior to Admission medications   Medication Sig Start Date End Date Taking? Authorizing Provider  Certolizumab Pegol (CIMZIA PREFILLED) 2 X 200 MG/ML KIT Inject  200 mg into the skin See admin instructions. Every 10 days Patient taking differently: Inject 200 mg into the skin every 14 (fourteen) days.  03/01/14  Yes Lord, Asa Saunas, NP  EPINEPHrine (EPIPEN JR) 0.15 MG/0.3ML injection Inject 0.15 mg into the muscle as needed for anaphylaxis.    Yes [provider]  LORazepam (ATIVAN) 1 MG tablet Take 1 tablet (1 mg total) by mouth daily as needed for anxiety. 08/17/17  Yes Charlcie Cradle, MD  norgestimate-ethinyl estradiol (ORTHO-CYCLEN, 28,) 0.25-35 MG-MCG tablet Take 1 tablet by mouth at bedtime. 03/01/14  Yes Patrecia Pour, NP    Physical Exam: Vitals:   10/26/17 0400 10/26/17 0415 10/26/17 0700 10/26/17 0704  BP: 116/81 124/87 (!) 120/94 (!) 120/94  Pulse: 87 83 97 97  Resp: 15 18 16    Temp:      TempSrc:      SpO2: 97% 97% 100%    General: Not in acute distress HEENT:       Eyes: PERRL, EOMI, no scleral icterus.       ENT: No discharge from the ears and nose, no pharynx injection, no tonsillar enlargement.        Neck: No JVD, no bruit, no mass felt. Heme: No neck lymph node enlargement. Cardiac: S1/S2, RRR, No murmurs, No gallops or rubs. Respiratory:  No rales, wheezing, rhonchi or rubs. GI: Soft, nondistended, nontender, no rebound pain, no organomegaly, BS present. GU: No hematuria Ext: No pitting leg edema bilaterally. 2+DP/PT pulse bilaterally. Musculoskeletal: No joint deformities, No joint redness or warmth, no limitation of ROM in spin. Skin:  She has petechial rash in the left arm. Neuro: Alert, oriented X3, cranial nerves II-XII grossly intact, moves all extremities normally.  Psych: Patient is not psychotic, no suicidal or hemocidal ideation.  Labs on Admission: I have personally reviewed following labs and imaging studies  CBC: Recent Labs  Lab 10/26/17 0107  WBC 6.2  HGB 13.2  HCT 41.1  MCV 84.6  PLT 268   Basic Metabolic Panel: Recent Labs  Lab 10/26/17 0107  NA 140  K 4.4  CL 104  CO2 27    GLUCOSE 93  BUN 15  CREATININE 0.69  CALCIUM 9.8   GFR: CrCl cannot be calculated (Unknown ideal weight.). Liver Function Tests: No results for input(s): AST, ALT, ALKPHOS, BILITOT, PROT, ALBUMIN in the last 168 hours. No results for input(s): LIPASE, AMYLASE in the last 168 hours. No results for input(s): AMMONIA in the last 168 hours. Coagulation Profile: No results for input(s): INR, PROTIME in the last 168 hours. Cardiac Enzymes: No results for input(s): CKTOTAL, CKMB, CKMBINDEX, TROPONINI in the last 168 hours. BNP (last 3 results) No results for input(s): PROBNP in the last 8760 hours. HbA1C: No results for input(s): HGBA1C in the last 72 hours. CBG: No results for input(s): GLUCAP in the last 168 hours. Lipid Profile: No results for input(s): CHOL, HDL, LDLCALC, TRIG, CHOLHDL, LDLDIRECT in the last 72 hours. Thyroid Function Tests: No results for input(s): TSH, T4TOTAL, FREET4, T3FREE, THYROIDAB in the last  72 hours. Anemia Panel: No results for input(s): VITAMINB12, FOLATE, FERRITIN, TIBC, IRON, RETICCTPCT in the last 72 hours. Urine analysis:    Component Value Date/Time   COLORURINE YELLOW 10/26/2017 0430   APPEARANCEUR CLEAR 10/26/2017 0430   LABSPEC 1.025 10/26/2017 0430   PHURINE 6.0 10/26/2017 0430   GLUCOSEU NEGATIVE 10/26/2017 0430   HGBUR SMALL (A) 10/26/2017 0430   BILIRUBINUR NEGATIVE 10/26/2017 0430   KETONESUR NEGATIVE 10/26/2017 0430   PROTEINUR NEGATIVE 10/26/2017 0430   UROBILINOGEN 0.2 02/28/2014 1329   NITRITE NEGATIVE 10/26/2017 0430   LEUKOCYTESUR NEGATIVE 10/26/2017 0430   Sepsis Labs: @LABRCNTIP (procalcitonin:4,lacticidven:4) )No results found for this or any previous visit (from the past 240 hour(s)).   Radiological Exams on Admission: [name] And Wo Contrast  Result Date: 10/26/2017 CLINICAL DATA:  Dizziness.  History of Behcet's disease. EXAM: MRI HEAD WITHOUT AND WITH CONTRAST TECHNIQUE: Multiplanar, multiecho pulse sequences of  the brain and surrounding structures were obtained without and with intravenous contrast. CONTRAST:  34m MULTIHANCE GADOBENATE DIMEGLUMINE 529 MG/ML IV SOLN COMPARISON:  Brain MRI 03/23/2016 FINDINGS: BRAIN: There is no acute infarct, acute hemorrhage or mass effect. The midline structures are normal. There are no old infarcts. Unchanged distribution of white matter lesions. The CSF spaces are normal for age, with no hydrocephalus. Susceptibility-sensitive sequences show no chronic microhemorrhage or superficial siderosis. No abnormal contrast enhancement. VASCULAR: Major intracranial arterial and venous sinus flow voids are preserved. SKULL AND UPPER CERVICAL SPINE: The visualized skull base, calvarium, upper cervical spine and extracranial soft tissues are normal. SINUSES/ORBITS: No fluid levels or advanced mucosal thickening. No mastoid or middle ear effusion. The orbits are normal. IMPRESSION: Unchanged nonspecific distribution of white matter lesions compared to 03/23/2016 and 02/12/2010. While nonspecific, the findings are compatible with sequelae of vascular-inflammatory (Behcet's Disease) or demyelinating disease. No diffusion restriction or abnormal contrast enhancement to indicate an acute process. Electronically Signed   By: KUlyses JarredM.D.   On: 10/26/2017 06:38     EKG: Independently reviewed.  Sinus rhythm, no ischemic change, QTC 453  Assessment/Plan Principal Problem:   Ataxia Active Problems:   Alcohol abuse   Behcet's disease (HCC)   Anxiety   Behcet's disease flare up: pt has dizziness, ataxia, slurred speech and generalized weakness.  Possibly due to Behcet's disease flareup.  Neurology, Dr. KLeonel Ramsaywas consulted.  Pending MRI of brain now  -will admit to tele bed as inpt -Frequent neuro check -Patient was given 1 dose of Solu-Medrol, 1 g--> need to follow-up urology's further recommendations. -Follow-up MRI of the brain -PT/OT -swallowing screen  Alcohol abuse an  e-cigarette use: -Nicotine patch prn -CIWA protocol  Anxiety: -prn ativan   DVT ppx: SQ Lovenox Code Status: Full code Family Communication:  Yes, patient's parents   at bed side Disposition Plan:  Anticipate discharge back to previous home environment Consults called: Neurology, Dr. KLeonel Ramsaywas consulted Admission status: Inpatient/tele    Date of Service 10/26/2017    XIvor CostaTri[facility] Hospitalists Pager [phone] If 7PM-7AM, please contact night-coverage [email or web address] Password [email or web address] 10/26/2017, 7:08 AM

## 2017-10-26 NOTE — ED Notes (Signed)
Niu, MD at bedside. °

## 2017-10-26 NOTE — ED Notes (Addendum)
Pt c/o SOB and "burning in my lungs". Admitting paged to this RN. Lungs clear to auscultate all fields. Pt 98% on room air.

## 2017-10-26 NOTE — Telephone Encounter (Signed)
Jacqueline Simpson is a 22 yo woman with Neuro-Behcet's disease who sees Dr. Leonie Man at [facility].   Over the past couple hours, she has developed vertigo.   IN the past, other flare-ups of her Neuro-Behcets began with vertigo and quickly progressed to ataxia, weakness and slurred speech.     She usually responds to high dose steroids with rapid improvements of symptoms.     I advised her mother, [name], to take her to the ED for IV steroids.  She should get a couple days followed by an oral taper.

## 2017-10-27 LAB — CBC
HCT: 37.3 % (ref 36.0–46.0)
Hemoglobin: 11.9 g/dL — ABNORMAL LOW (ref 12.0–15.0)
MCH: 27 pg (ref 26.0–34.0)
MCHC: 31.9 g/dL (ref 30.0–36.0)
MCV: 84.8 fL (ref 78.0–100.0)
Platelets: 226 10*3/uL (ref 150–400)
RBC: 4.4 MIL/uL (ref 3.87–5.11)
RDW: 13.1 % (ref 11.5–15.5)
WBC: 8.7 10*3/uL (ref 4.0–10.5)

## 2017-10-27 LAB — BASIC METABOLIC PANEL
Anion gap: 10 (ref 5–15)
BUN: 9 mg/dL (ref 6–20)
CO2: 20 mmol/L — ABNORMAL LOW (ref 22–32)
Calcium: 9 mg/dL (ref 8.9–10.3)
Chloride: 108 mmol/L (ref 101–111)
Creatinine, Ser: 0.53 mg/dL (ref 0.44–1.00)
GFR calc Af Amer: >60 mL/min (ref >60)
GFR calc non Af Amer: >60 mL/min (ref >60)
Glucose, Bld: 91 mg/dL (ref 65–99)
Potassium: 4.1 mmol/L (ref 3.5–5.1)
Sodium: 138 mmol/L (ref 135–145)

## 2017-10-27 LAB — HIV ANTIBODY (ROUTINE TESTING W REFLEX): HIV Screen 4th Generation wRfx: NONREACTIVE

## 2017-10-27 MED ORDER — METHYLPREDNISOLONE 4 MG PO TBPK: ORAL_TABLET | ORAL | 0 refills | Status: DC

## 2017-10-27 MED ORDER — PANTOPRAZOLE SODIUM 40 MG PO TBEC: 40.0000 mg | DELAYED_RELEASE_TABLET | Freq: Every day | ORAL | 0 refills | Status: DC

## 2017-10-27 NOTE — Progress Notes (Signed)
Patient discharged to Home with mom an dad. After visit Summary reviewed. Patient capable of reverbalizing medications and follow up visits. No signs and symptoms of distress noted. Patient educated to return to the ED in the case of an emergency. Dillon Bjork RN

## 2017-10-27 NOTE — Discharge Instructions (Signed)
Behcet Syndrome, Adult Behcet syndrome is a long-term (chronic) condition that causes swelling and irritation (inflammation) of blood vessels (vasculitis). This inflammation can lead to painful mouth ulcers (canker sores) and skin ulcers. Canker sores may appear anywhere in the mouth. Behcet syndrome may also affect other parts of the body, including the eyes, joints, and genitals. Behcet syndrome may be passed down to family members through an abnormal gene. This gene may cause your immune system to mistakenly attack blood vessels, causing inflammation (autoimmune disorder). In other cases, the disorder may occur without a family history. What are the causes? The exact cause of this condition is not known. What increases the risk? This condition is more likely to occur in people who:  Have a parent who carries the gene that is associated with the condition. The gene is more common in people of [place] and Asian descent.  Are between 7 and 85 years old.  Are female.  What are the signs or symptoms? Symptoms of this condition may come and go and can range from mild to severe. Common symptoms include:  Canker sores.  Genital ulcers.  Skin ulcers.  Painful skin bumps or acne.  Eye inflammation, which can cause eye pain, redness, tearing, and blurred vision.  Joint pain and inflammation (arthritis).  Less common symptoms include:  Digestive system inflammation, which can cause nausea, belly pain, diarrhea, or bloody diarrhea.  Brain inflammation, which can cause headaches and clumsiness.  Large blood vessel inflammation, which can cause blood clots.  Lung or heart inflammation, which can cause breathing difficulty or chest pain.  How is this diagnosed? This condition is diagnosed based on your symptoms, medical history, and a physical exam. Your health care provider may also do a skin test to confirm the diagnosis. This involves pricking the skin with a needle to see if  small red bumps form (pathergy test). How is this treated? There is no cure for this condition. Treatment depends on your symptoms. Treatment may include steroid medicines, such as:  Steroid creams or ointments for oral, genital, or other skin ulcers.  Numbing steroid mouthwash for painful mouth ulcers.  Steroid eye drops for eye symptoms.  Oral steroid medicine to treat a flare-up of symptoms.  Other medicines used to treat symptoms of this condition may include:  NSAIDs to relieve pain and swelling.  Medicines that suppress the immune system response for severe symptoms (immunosuppressive medicines).  A medicine for severe symptoms that do not respond to other treatments (TNF inhibitor).  Follow these instructions at home:  Learn as much as you can about your condition.  Work closely with your team of health care providers.  Take or apply over-the-counter and prescription medicines only as told by your health care providers.  Rest at home during a flare-up of symptoms.  Return to your normal activities as told by your health care providers. Ask your health care providers what activities are safe.  Keep all follow-up visits as told by your health care provider. This is important. Where to find more information:  American Behcet's Disease Association: www.behcets.com Contact a health care provider if:  You have a fever.  You have a flare-up of Behcet symptoms.  You have side effects from medicines.  You develop any new symptoms of Behcet syndrome. Get help right away if:  You have a severe headache.  You become unusually clumsy.  You have a sudden change in vision.  You have chest pain or difficulty breathing.  You vomit blood  or have bloody stool. Summary  Behcet syndrome is a long-term (chronic) condition that causes swelling and irritation (inflammation) of blood vessels (vasculitis).  Treatment may include steroid medicines and other medicines to  relieve pain and swelling.  During symptom flare-ups, rest at home and then return to your normal activities as told by your health care provider. This information is not intended to replace advice given to you by your health care provider. Make sure you discuss any questions you have with your health care provider. Document Released: 04/22/2002 Document Revised: 04/20/2016 Document Reviewed: 04/20/2016 Elsevier Interactive Patient Education  [employer].

## 2017-10-27 NOTE — Progress Notes (Signed)
Subjective: No further dizziness at this time feeling back to baseline.  Exam: Vitals:   10/26/17 2113 10/27/17 0426  BP: 117/83 112/61  Pulse: (!) 108 92  Resp: 19 14  Temp: 98.4 F (36.9 C) 98 F (36.7 C)  SpO2: 97% 99%    Physical Exam   HEENT-  Normocephalic, no lesions, without obvious abnormality.  Normal external eye and conjunctiva.   Extremities- Warm, dry and intact Musculoskeletal-no joint tenderness, deformity or swelling Skin-warm and dry, no hyperpigmentation, vitiligo, or suspicious lesions    Neuro:  Mental Status: Alert, oriented, thought content appropriate.  Speech fluent without evidence of aphasia.  Able to follow 3 step commands without difficulty. Cranial Nerves: II:  Visual fields grossly normal,  III,IV, VI: ptosis not present, extra-ocular motions intact bilaterally pupils equal, round, reactive to light and accommodation V,VII: smile symmetric, facial light touch sensation normal bilaterally VIII: hearing normal bilaterally IX,X: uvula rises symmetrically XI: bilateral shoulder shrug XII: midline tongue extension Motor: Right : Upper extremity   5/5    Left:     Upper extremity   5/5  Lower extremity   5/5     Lower extremity   5/5 Tone and bulk:normal tone throughout; no atrophy noted Sensory: Pinprick and light touch intact throughout, bilaterally Deep Tendon Reflexes: 2+ and symmetric throughout Plantars: Right: downgoing   Left: downgoing Cerebellar: normal finger-to-nose,and normal heel-to-shin test     Medications:  Scheduled: . enoxaparin (LOVENOX) injection  40 mg Subcutaneous Q24H  . folic acid  1 mg Oral Daily  . LORazepam  0-4 mg Intravenous Q6H   Followed by  . [START ON 10/28/2017] LORazepam  0-4 mg Intravenous Q12H  . multivitamin with minerals  1 tablet Oral Daily  . nicotine  7 mg Transdermal Daily  . norgestimate-ethinyl estradiol  1 tablet Oral QHS  . pantoprazole  40 mg Oral Q0600  . thiamine  100 mg Oral Daily   Or  . thiamine  100 mg Intravenous Daily    Pertinent Labs/Diagnostics: None  Mr Jacqueline Simpson And Wo Contrast  Result Date: 10/26/2017 CLINICAL DATA:  Dizziness.  History of Behcet's disease. EXAM: MRI HEAD WITHOUT AND WITH CONTRAST TECHNIQUE: Multiplanar, multiecho pulse sequences of the brain and surrounding structures were obtained without and with intravenous contrast. CONTRAST:  30mL MULTIHANCE GADOBENATE DIMEGLUMINE 529 MG/ML IV SOLN COMPARISON:  Brain MRI 03/23/2016 FINDINGS: BRAIN: There is no acute infarct, acute hemorrhage or mass effect. The midline structures are normal. There are no old infarcts. Unchanged distribution of white matter lesions. The CSF spaces are normal for age, with no hydrocephalus. Susceptibility-sensitive sequences show no chronic microhemorrhage or superficial siderosis. No abnormal contrast enhancement. VASCULAR: Major intracranial arterial and venous sinus flow voids are preserved. SKULL AND UPPER CERVICAL SPINE: The visualized skull base, calvarium, upper cervical spine and extracranial soft tissues are normal. SINUSES/ORBITS: No fluid levels or advanced mucosal thickening. No mastoid or middle ear effusion. The orbits are normal. IMPRESSION: Unchanged nonspecific distribution of white matter lesions compared to 03/23/2016 and 02/12/2010. While nonspecific, the findings are compatible with sequelae of vascular-inflammatory (Behcet's Disease) or demyelinating disease. No diffusion restriction or abnormal contrast enhancement to indicate an acute process. Electronically Signed   By: Ulyses Jarred M.D.   On: 10/26/2017 06:38        Assessment: 23 year old with past medical history of neuro Behcet's, presenting emergency room with evaluation of vertigo, ataxia and generalized weakness.  Clinical presentation is similar to prior flareups.  Imaging unremarkable  for enhancement but neuro but has acute presentation might not present with contrast enhancement.  Patient has  finished her 2 days of 1 g Solu-Medrol.  Feeling back to baseline.  Impression:  -Possible flareup of neuro Behcet's  Recommendations: -Follow-up with Dr. [name] Shelling as an outpatient - Medrol dose pack on discharge  Etta Quill PA-C [facility] [phone]  10/27/2017, 9:38 AM

## 2017-10-27 NOTE — Evaluation (Signed)
Occupational Therapy Evaluation Patient Details Name: KAELY HOLLAN MRN: [id number] DOB: January 29, 1996 Today's Date: 10/27/2017    History of Present Illness EXIE CHRISMER is a 22 y.o. female with medical history significant of Neuro-Behcet's disease, depression, anxiety, seizures childhood, alcohol abuse, E cigarette user, who presents with dizziness, ataxia, slurred speech, generalized weakness   Clinical Impression   Pt is at independent baseline level of function with ADLs and mobility. Pt declines any pain, visual impairments and dizziness at this time. All education completed and no further acute OT is indicated at this time    Follow Up Recommendations  No OT follow up    Equipment Recommendations  None recommended by OT    Recommendations for Other Services       Precautions / Restrictions Precautions Precautions: None Restrictions Weight Bearing Restrictions: No      Mobility Bed Mobility Overal bed mobility: Independent                Transfers Overall transfer level: Independent                    Balance Overall balance assessment: Independent;No apparent balance deficits (not formally assessed)                                         ADL either performed or assessed with clinical judgement   ADL Overall ADL's : Independent;At baseline                                             Vision Baseline Vision/History: No visual deficits Patient Visual Report: No change from baseline       Perception     Praxis      Pertinent Vitals/Pain Pain Assessment: No/denies pain     Hand Dominance Right   Extremity/Trunk Assessment Upper Extremity Assessment Upper Extremity Assessment: Overall WFL for tasks assessed   Lower Extremity Assessment Lower Extremity Assessment: Defer to PT evaluation   Cervical / Trunk Assessment Cervical / Trunk Assessment: Normal   Communication  Communication Communication: No difficulties   Cognition Arousal/Alertness: Awake/alert Behavior During Therapy: WFL for tasks assessed/performed Overall Cognitive Status: Within Functional Limits for tasks assessed                                     General Comments       Exercises     Shoulder Instructions      Home Living Family/patient expects to be discharged to:: Private residence Living Arrangements: Parent Available Help at Discharge: Family;Friend(s);Available PRN/intermittently Type of Home: House Home Access: Stairs to enter [employer] of Steps: 2   Home Layout: One level     Bathroom Shower/Tub: Tub/shower unit;Walk-in shower   Bathroom Toilet: Standard     Home Equipment: None          Prior Functioning/Environment Level of Independence: Independent                 OT Problem List: Decreased coordination      OT Treatment/Interventions:      OT Goals(Current goals can be found in the care plan section) Acute Rehab OT Goals Patient Stated Goal: go home today  OT Goal Formulation: With patient/family  OT Frequency:     Barriers to D/C:    no barriers       Co-evaluation              AM-PAC PT "6 Clicks" Daily Activity     Outcome Measure Help from another person eating meals?: None Help from another person taking care of personal grooming?: None Help from another person toileting, which includes using toliet, bedpan, or urinal?: None Help from another person bathing (including washing, rinsing, drying)?: None Help from another person to put on and taking off regular upper body clothing?: None Help from another person to put on and taking off regular lower body clothing?: None 6 Click Score: 24   End of Session    Activity Tolerance: Patient tolerated treatment well Patient left: in bed;with call bell/phone within reach;with family/visitor present(sitting EOB)  OT Visit Diagnosis: Dizziness and  giddiness (R42)                Time: 5329-9242 OT Time Calculation (min): 29 min Charges:  OT General Charges $OT Visit: 1 Visit OT Evaluation $OT Eval Moderate Complexity: 1 Mod OT Treatments $Therapeutic Activity: 8-22 mins G-Codes: OT G-codes **NOT FOR INPATIENT CLASS** Functional Assessment Tool Used: AM-PAC 6 Clicks Daily Activity     Britt Bottom 10/27/2017, 12:21 PM

## 2017-10-27 NOTE — Discharge Summary (Addendum)
Physician Discharge Summary  AJANI SCHNIEDERS HQR:[id number] DOB: 1996/01/04 DOA: 10/26/2017  PCP: [facility] @ [place]  Admit date: 10/26/2017 Discharge date: 10/27/2017  Time spent: 45 minutes  Recommendations for Outpatient Follow-up:  Patient will be discharged to home.  Patient will need to follow up with primary care provider within one week of discharge. Follow up with Dr. [name] Shelling, neurology.  Patient should continue medications as prescribed.  Patient should follow a regular diet.   Discharge Diagnoses:  Behcet's disease exacerbation Alcohol and nicotine use Anxiety  Discharge Condition: Stable  Diet recommendation: regular  Filed Weights   10/26/17 2113  Weight: 51.3 kg (113 lb)    History of present illness:  On 10/26/2017 by Dr. Rolm Bookbinder is a 22 y.o. female with medical history significant of Neuro-Behcet's disease, depression, anxiety, seizures childhood, alcohol abuse, E cigarette user, who presents with dizziness, ataxia, slurred speech, generalized weakness.  Pt has hx of Neuro-Behcet's disease, and is followed up with Dr. Leonie Man at [facility]. She started having dizziness, vertigo last night, then quickly progressed to ataxia.  Patient also has generalized weakness, HA and slurred speech.  Patient does not have fever, chills.  No recent viral symptoms.  Denies chest pain, shortness breath, cough.  No GI symptoms of symptoms of UTI. Pt states that her symptoms can be cutaneous lesions or Neuro-symptoms. She has petechial rash in the left arm.She had brain lesions seen on MRI.   Hospital Course:  Behcet's disease exacerbation -Presented with dizziness and ataxia slurred speech and weakness -MRI brain: Unchanged nonspecific distribution of white matter lesions.  No diffusion restriction or abnormal contrast enhancement to indicate an acute process. -Neurology consulted and appreciated, recommended of IV steroids, Solu-Medrol  1000 mg, followed by Medrol Dosepak taper and outpatient neurology follow-up -Symptoms have now resolved -Placed on PPI for GI prophylaxis given steroid use  Alcohol and nicotine use -Patient uses e-cigarette, counseled -No signs of alcohol withdrawal, stable  Anxiety -Continue ativan  Procedures:  None  Consultations:  Neurology  Discharge Exam: Vitals:   10/26/17 2113 10/27/17 0426  BP: 117/83 112/61  Pulse: (!) 108 92  Resp: 19 14  Temp: 98.4 F (36.9 C) 98 F (36.7 C)  SpO2: 97% 99%   Patient feeling better.  Denies current dizziness or headache, chest pain or shortness of breath, abdominal pain, nausea or vomiting.  No longer having slurred speech.   General: Well developed, well nourished, NAD, appears stated age  HEENT: NCAT, mucous membranes moist.  Neck: Supple  Cardiovascular: S1 S2 auscultated, no rubs, murmurs or gallops. Regular rate and rhythm.  Respiratory: Clear to auscultation bilaterally with equal chest rise  Abdomen: Soft, nontender, nondistended, + bowel sounds  Extremities: warm dry without cyanosis clubbing or edema  Neuro: AAOx3, nonfocal  Skin: Without rashes exudates or nodules  Psych: Normal affect and demeanor with intact judgement and insight  Discharge Instructions Discharge Instructions    Discharge instructions   Complete by:  As directed    Patient will be discharged to home.  Patient will need to follow up with primary care provider within one week of discharge. Follow up with Dr. [name] Shelling, neurology.  Patient should continue medications as prescribed.  Patient should follow a regular diet.     Allergies as of 10/27/2017      Reactions   Colchicine Shortness Of Breath   Milk-related Compounds Other (See Comments)   GI UPSET   Sulfa Antibiotics Hives, Swelling  SWELLING LIPS   Adhesive [tape] Other (See Comments)   SKIN IRRITATION   Soap Itching      Medication List    TAKE these medications   Certolizumab Pegol  2 X 200 MG/ML Kit Commonly known as:  CIMZIA PREFILLED Inject 200 mg into the skin See admin instructions. Every 10 days What changed:    when to take this  additional instructions   EPINEPHrine 0.15 MG/0.3ML injection Commonly known as:  EPIPEN JR Inject 0.15 mg into the muscle as needed for anaphylaxis.   LORazepam 1 MG tablet Commonly known as:  ATIVAN Take 1 tablet (1 mg total) by mouth daily as needed for anxiety.   methylPREDNISolone 4 MG Tbpk tablet Commonly known as:  MEDROL DOSEPAK Use as directed. Take 6 pills on day 1, then 5 on day 2, then 4 on day 3, then 3 on day 4, then 2 on day 5, then 1 on day 6.   norgestimate-ethinyl estradiol 0.25-35 MG-MCG tablet Commonly known as:  ORTHO-CYCLEN (28) Take 1 tablet by mouth at bedtime.   pantoprazole 40 MG tablet Commonly known as:  PROTONIX Take 1 tablet (40 mg total) by mouth daily at 6 (six) AM. Start taking on:  10/28/2017      Allergies  Allergen Reactions  . Colchicine Shortness Of Breath  . Milk-Related Compounds Other (See Comments)    GI UPSET  . Sulfa Antibiotics Hives and Swelling    SWELLING LIPS  . Adhesive [Tape] Other (See Comments)    SKIN IRRITATION  . Soap Itching   Follow-up [place] [place] @ [facility]. Schedule an appointment as soon as possible for a visit in 1 week(s).   Specialty:  Family Medicine Why:  Hospital follow up Contact information: [place] [address] [phone]        Britt Bottom, MD. Schedule an appointment as soon as possible for a visit in 1 week(s).   Specialty:  Neurology Why:  Hospital follow up Contact information: [place] [address] [phone]            The results of significant diagnostics from this hospitalization (including imaging, microbiology, ancillary and laboratory) are listed below for reference.    Significant Diagnostic Studies: [name] And Wo  Contrast  Result Date: 10/26/2017 CLINICAL DATA:  Dizziness.  History of Behcet's disease. EXAM: MRI HEAD WITHOUT AND WITH CONTRAST TECHNIQUE: Multiplanar, multiecho pulse sequences of the brain and surrounding structures were obtained without and with intravenous contrast. CONTRAST:  16m MULTIHANCE GADOBENATE DIMEGLUMINE 529 MG/ML IV SOLN COMPARISON:  Brain MRI 03/23/2016 FINDINGS: BRAIN: There is no acute infarct, acute hemorrhage or mass effect. The midline structures are normal. There are no old infarcts. Unchanged distribution of white matter lesions. The CSF spaces are normal for age, with no hydrocephalus. Susceptibility-sensitive sequences show no chronic microhemorrhage or superficial siderosis. No abnormal contrast enhancement. VASCULAR: Major intracranial arterial and venous sinus flow voids are preserved. SKULL AND UPPER CERVICAL SPINE: The visualized skull base, calvarium, upper cervical spine and extracranial soft tissues are normal. SINUSES/ORBITS: No fluid levels or advanced mucosal thickening. No mastoid or middle ear effusion. The orbits are normal. IMPRESSION: Unchanged nonspecific distribution of white matter lesions compared to 03/23/2016 and 02/12/2010. While nonspecific, the findings are compatible with sequelae of vascular-inflammatory (Behcet's Disease) or demyelinating disease. No diffusion restriction or abnormal contrast enhancement to indicate an acute process. Electronically Signed   By: KUlyses Jarred  M.D.   On: 10/26/2017 06:38    Microbiology: No results found for this or any previous visit (from the past 240 hour(s)).   Labs: Basic Metabolic Panel: Recent Labs  Lab 10/26/17 0107 10/27/17 0723  NA 140 138  K 4.4 4.1  CL 104 108  CO2 27 20*  GLUCOSE 93 91  BUN 15 9  CREATININE 0.69 0.53  CALCIUM 9.8 9.0   Liver Function Tests: No results for input(s): AST, ALT, ALKPHOS, BILITOT, PROT, ALBUMIN in the last 168 hours. No results for input(s): LIPASE, AMYLASE in  the last 168 hours. No results for input(s): AMMONIA in the last 168 hours. CBC: Recent Labs  Lab 10/26/17 0107 10/27/17 0723  WBC 6.2 8.7  HGB 13.2 11.9*  HCT 41.1 37.3  MCV 84.6 84.8  PLT 236 226   Cardiac Enzymes: No results for input(s): CKTOTAL, CKMB, CKMBINDEX, TROPONINI in the last 168 hours. BNP: BNP (last 3 results) No results for input(s): BNP in the last 8760 hours.  ProBNP (last 3 results) No results for input(s): PROBNP in the last 8760 hours.  CBG: No results for input(s): GLUCAP in the last 168 hours.     Signed:  Cristal Ford  [facility] 10/27/2017, 10:42 AM

## 2017-10-27 NOTE — Telephone Encounter (Signed)
Thanks Richard. I spoke to Dr Rory Percy who is taking care of her

## 2017-10-30 ENCOUNTER — Telehealth: Payer: Self-pay | Admitting: Neurology

## 2017-10-30 NOTE — Telephone Encounter (Signed)
Ok to switch 

## 2017-10-30 NOTE — Telephone Encounter (Signed)
Patients mother called and wanted to schedule appointment for patient with Dr. [name] Shelling, pt is seen by Dr. Leonie Man in the past. Jacqueline Simpson hasNeuro-Behcet'sdisease and was told at discharge she needs to f/u with Dr. [name] Shelling. Is it ok for pt to switch to Dr. [name] Shelling?

## 2017-11-01 NOTE — Telephone Encounter (Signed)
Okay with me 

## 2017-11-02 DIAGNOSIS — M352 Behcet's disease: Secondary | ICD-10-CM | POA: Diagnosis not present

## 2017-11-02 DIAGNOSIS — R42 Dizziness and giddiness: Secondary | ICD-10-CM | POA: Diagnosis not present

## 2017-11-02 DIAGNOSIS — Z79899 Other long term (current) drug therapy: Secondary | ICD-10-CM | POA: Diagnosis not present

## 2017-11-08 ENCOUNTER — Other Ambulatory Visit: Payer: Self-pay

## 2017-11-08 ENCOUNTER — Ambulatory Visit: Payer: 59 | Admitting: Neurology

## 2017-11-08 ENCOUNTER — Encounter: Payer: Self-pay | Admitting: Neurology

## 2017-11-08 VITALS — BP 112/74 | HR 81 | Resp 16 | Ht 59.0 in | Wt 116.0 lb

## 2017-11-08 DIAGNOSIS — G4721 Circadian rhythm sleep disorder, delayed sleep phase type: Secondary | ICD-10-CM | POA: Insufficient documentation

## 2017-11-08 DIAGNOSIS — R270 Ataxia, unspecified: Secondary | ICD-10-CM | POA: Diagnosis not present

## 2017-11-08 DIAGNOSIS — Z8669 Personal history of other diseases of the nervous system and sense organs: Secondary | ICD-10-CM | POA: Diagnosis not present

## 2017-11-08 DIAGNOSIS — M352 Behcet's disease: Secondary | ICD-10-CM | POA: Diagnosis not present

## 2017-11-08 DIAGNOSIS — F4010 Social phobia, unspecified: Secondary | ICD-10-CM | POA: Diagnosis not present

## 2017-11-08 MED ORDER — LORAZEPAM 1 MG PO TABS: 1.0000 mg | ORAL_TABLET | Freq: Every day | ORAL | 1 refills | Status: DC | PRN

## 2017-11-08 NOTE — Progress Notes (Signed)
[facility]  PATIENT: Jacqueline Simpson DOB: 1996-03-09  REFERRING DOCTOR OR PCP:  Dr. Leonie Man (Neuro); Dr. Lenna [place] (Rheum) SOURCE: patient, notes in EMR including recent hospitalization, Lab and MRI reports, MRI images personally reviewed..  _________________________________   HISTORICAL  CHIEF COMPLAINT:  Chief Complaint  Patient presents with  . Behcet's Dz    Jacqueline Simpson is here with her parents, Octavia Bruckner and [name], for eval of Behcet's Dz.  See by Dr. Leonie Man in the past for same.  Recent hospitalization for dizziness.  Has completed Medrol dose pk and dizziness is completely resolved.  Last episodeof dizziness/hospitalization prior to this was in 2016.  Also sees Dr. Berna Bue (rheumatology)/fim    HISTORY OF PRESENT ILLNESS:  Jacqueline Simpson is a 22 year old woman with Neuro-Behcet's disease.  She was diagnosed with Neuro-Behcet's around age 72.    She actually had mouth ulcers, dizziness and vomiting episodes multiple times as a younger child and had optic neuritis at age 6.   She had an MRI after the ON and was originally diagnosed with MS and placed on MS medications.   After she also had genital ulcers and more oral ulcers a biopsy was performed consistent with vasculitis.    Since a relapse in 2011, she has had speech slurring.     She saw Dr. Normajean Baxter (Rheum) and Dr. Ebony Cargo (Neuro) in [place] around age 22 and was diagnosed with Neuro-Behcet's.    She had been on Humira (broke through with 2011 relapse) and Remicade (had possible allergic reaction) in the past.    She is on Cimzia (anti-TNF) and is doing well for the most part x several years.   She gets some mouth ulcers but no recent genital lesions.  She has some joint pain in the wrists and knees.     She has sleep onset insomnia worse than sleep maintenance.   She has a delayed phase sleep  She has depression and anxiety (especially social).   She is on prn ativan.    She had been on Paxil but it did not help.     She prefers not to be on a daily medication for mood..    I personally reviewed the MRIs of the brain dated 02/12/2010, 03/28/2016 and 10/26/2017.  The 2011 MRI showed an enhancing focus near the fourth ventricle in the left cerebellar hemisphere.  Additionally there were nonspecific white matter foci in the hemispheres and cerebellum.  The 2017 and 2019 MRIs showed a stable pattern of nonspecific foci in the cerebral and cerebellar white matter.  The cerebellar lesion was smaller and no longer enhanced on those 2 more recent  MRIs.   Review of labs is unremarkable   REVIEW OF SYSTEMS: Constitutional: No fevers, chills, sweats, or change in appetite.  Has insomnia Eyes: No visual changes, double vision, eye pain Ear, nose and throat: No hearing loss, ear pain, nasal congestion, sore throat Cardiovascular: No chest pain, palpitations Respiratory: No shortness of breath at rest or with exertion.   No wheezes GastrointestinaI: No nausea, vomiting, diarrhea, abdominal pain, fecal incontinence Genitourinary: No dysuria, urinary retention or frequency.  No nocturia. Musculoskeletal: No neck pain, back pain Integumentary: No rash, pruritus, skin lesions Neurological: as above Psychiatric: No depression at this time.  No anxiety Endocrine: No palpitations, diaphoresis, change in appetite, change in weigh or increased thirst Hematologic/Lymphatic: No anemia, purpura, petechiae. Allergic/Immunologic: No itchy/runny eyes, nasal congestion, recent allergic reactions, rashes  ALLERGIES: Allergies  Allergen Reactions  . Colchicine  Shortness Of Breath  . Milk-Related Compounds Other (See Comments)    GI UPSET  . Sulfa Antibiotics Hives and Swelling    SWELLING LIPS  . Adhesive [Tape] Other (See Comments)    SKIN IRRITATION  . Soap Itching    HOME MEDICATIONS:  Current Outpatient Medications:  .  Certolizumab Pegol (CIMZIA PREFILLED) 2 X 200 MG/ML KIT, Inject 200 mg into the skin See admin  instructions. Every 10 days (Patient taking differently: Inject 200 mg into the skin every 14 (fourteen) days. ), Disp: 1 kit, Rfl:  .  EPINEPHrine (EPIPEN JR) 0.15 MG/0.3ML injection, Inject 0.15 mg into the muscle as needed for anaphylaxis. , Disp: , Rfl:  .  LORazepam (ATIVAN) 1 MG tablet, Take 1 tablet (1 mg total) by mouth daily as needed for anxiety., Disp: 30 tablet, Rfl: 1 .  norgestimate-ethinyl estradiol (ORTHO-CYCLEN, 28,) 0.25-35 MG-MCG tablet, Take 1 tablet by mouth at bedtime., Disp: 1 Package, Rfl:  .  pantoprazole (PROTONIX) 40 MG tablet, Take 1 tablet (40 mg total) by mouth daily at 6 (six) AM. (Patient not taking: Reported on 11/08/2017), Disp: 30 tablet, Rfl: 0  PAST MEDICAL HISTORY: Past Medical History:  Diagnosis Date  . Anxiety   . Behcet's disease ([place])    hx. brain lesions, mouth and skin ulcers  . Depression   . History of MRSA infection 2015   arms, legs  . History of seizures as a child    due to Behcet's disease - no seizures since age 22  . Laceration of wrist with tendon involvement 63/89/3734   self-inflicted  . Social anxiety disorder     PAST SURGICAL HISTORY: Past Surgical History:  Procedure Laterality Date  . ARTERY AND TENDON REPAIR Left 03/04/2014   Procedure: ARTERY AND TENDON REPAIR;  Surgeon: Leanora Cover, MD;  Location: [facility];  Service: Orthopedics;  Laterality: Left;  . LUMBAR PUNCTURE  04/28/2003   under anesthesia  . WOUND EXPLORATION Left 03/04/2014   Procedure: LEFT WRIST EXPLORATION;  Surgeon: Leanora Cover, MD;  Location: [facility];  Service: Orthopedics;  Laterality: Left;    FAMILY HISTORY: Family History  Problem Relation Age of Onset  . Depression Mother   . Graves' disease Mother   . ADD / ADHD Brother   . OCD Brother   . Bipolar disorder Maternal Uncle     SOCIAL HISTORY:  Social History   Socioeconomic History  . Marital status: Single    Spouse name: Not on file  . Number of  children: Not on file  . Years of education: Not on file  . Highest education level: Not on file  Occupational History  . Occupation: unemployed  Social Needs  . Financial resource strain: Not on file  . Food insecurity:    Worry: Not on file    Inability: Not on file  . Transportation needs:    Medical: Not on file    Non-medical: Not on file  Tobacco Use  . Smoking status: Current Every Day Smoker    Years: 0.50    Types: E-cigarettes  . Smokeless tobacco: Never Used  . Tobacco comment: use everyday  Substance and Sexual Activity  . Alcohol use: Yes    Alcohol/week: 0.6 oz    Types: 1 Glasses of wine per week    Comment: occasionally 1 drink every few months  . Drug use: Yes    Types: Marijuana    Comment: have not use in over 1  year  . Sexual activity: Not Currently    Partners: Female, Female  Lifestyle  . Physical activity:    Days per week: Not on file    Minutes per session: Not on file  . Stress: Not on file  Relationships  . Social connections:    Talks on phone: Not on file    Gets together: Not on file    Attends religious service: Not on file    Active member of club or organization: Not on file    Attends meetings of clubs or organizations: Not on file    Relationship status: Not on file  . Intimate partner violence:    Fear of current or ex partner: Not on file    Emotionally abused: Not on file    Physically abused: Not on file    Forced sexual activity: Not on file  Other Topics Concern  . Not on file  Social History Narrative  . Not on file     PHYSICAL EXAM  Vitals:   11/08/17 0852  BP: 112/74  Pulse: 81  Resp: 16  Weight: 116 lb (52.6 kg)  Height: 4' 11"  (1.499 m)    Body mass index is 23.43 kg/m.   General: The patient is well-developed and well-nourished and in no acute distress  Eyes:  Funduscopic exam shows optic nerve pallor on the left.  Neck: The neck is supple, no carotid bruits are noted.  The neck is  nontender.  Cardiovascular: The heart has a regular rate and rhythm with a normal S1 and S2. There were no murmurs, gallops or rubs. Lungs are clear to auscultation.  Skin: Extremities are without significant edema.  Musculoskeletal:  Back is nontender  Neurologic Exam  Mental status: The patient is alert and oriented x 3 at the time of the examination. The patient has apparent normal recent and remote memory, with an apparently normal attention span and concentration ability.   Speech is normal.  Cranial nerves: Extraocular movements are full.  She has a slight APD on the left.  Color vision was mildly reduced on the left..  Facial symmetry is present. There is good facial sensation to soft touch bilaterally.Facial strength is normal.  Trapezius and sternocleidomastoid strength is normal. No dysarthria is noted.  The tongue is midline, and the patient has symmetric elevation of the soft palate. No obvious hearing deficits are noted.  Motor:  Muscle bulk is normal.   Tone is normal. Strength is  5 / 5 in all 4 extremities.   Sensory: Sensory testing is intact to pinprick, soft touch and vibration sensation in all 4 extremities.  Coordination: Cerebellar testing reveals good finger-nose-finger and heel-to-shin bilaterally.  Gait and station: Station is normal.   Gait is normal.  The tandem gait is wide. Romberg is negative.   Reflexes: Deep tendon reflexes are symmetric and normal bilaterally.   Plantar responses are flexor.    DIAGNOSTIC DATA (LABS, IMAGING, TESTING) - I reviewed patient records, labs, notes, testing and imaging myself where available.  Lab Results  Component Value Date   WBC 8.7 10/27/2017   HGB 11.9 (L) 10/27/2017   HCT 37.3 10/27/2017   MCV 84.8 10/27/2017   PLT 226 10/27/2017      Component Value Date/Time   NA 138 10/27/2017 0723   K 4.1 10/27/2017 0723   CL 108 10/27/2017 0723   CO2 20 (L) 10/27/2017 0723   GLUCOSE 91 10/27/2017 0723   BUN 9  10/27/2017 0723  CREATININE 0.53 10/27/2017 0723   CALCIUM 9.0 10/27/2017 0723   PROT 8.9 (H) 02/28/2014 1230   ALBUMIN 3.9 02/28/2014 1230   AST 18 02/28/2014 1230   ALT 14 02/28/2014 1230   ALKPHOS 46 02/28/2014 1230   BILITOT <0.2 (L) 02/28/2014 1230   GFRNONAA >60 10/27/2017 0723   GFRAA >60 10/27/2017 0723       ASSESSMENT AND PLAN  Behcet's disease (HCC)  Social anxiety disorder - Plan: LORazepam (ATIVAN) 1 MG tablet  H/O optic neuritis  Ataxia  Delayed sleep phase syndrome   1.  She will continue Cimzia for the Behcet syndrome and is followed by rheumatology.  2.   She is advised to call us if she believes she is going into a flare for IV steroids and, depending on symptoms, MRI. 3.    Continue as needed Ativan for anxiety. 4.   We discussed strategies to try to correct the delayed phase sleep syndrome she will return to see me in 6 months or sooner if there are new or worsening neurologic symptoms.     Bennet Kujawa A. [name] Shelling, MD, [facility] 06/17/3341, 56:86 AM Certified in Neurology, Clinical Neurophysiology, Sleep Medicine, Pain Medicine and Neuroimaging  [facility] [address], [place], [place] [address] [phone]

## 2018-01-01 ENCOUNTER — Ambulatory Visit: Payer: 59 | Admitting: Diagnostic Neuroimaging

## 2018-01-09 DIAGNOSIS — Z0000 Encounter for general adult medical examination without abnormal findings: Secondary | ICD-10-CM | POA: Diagnosis not present

## 2018-01-10 DIAGNOSIS — R197 Diarrhea, unspecified: Secondary | ICD-10-CM | POA: Diagnosis not present

## 2018-01-10 DIAGNOSIS — Z0000 Encounter for general adult medical examination without abnormal findings: Secondary | ICD-10-CM | POA: Diagnosis not present

## 2018-01-25 DIAGNOSIS — M2550 Pain in unspecified joint: Secondary | ICD-10-CM | POA: Diagnosis not present

## 2018-01-25 DIAGNOSIS — M064 Inflammatory polyarthropathy: Secondary | ICD-10-CM | POA: Diagnosis not present

## 2018-01-25 DIAGNOSIS — M352 Behcet's disease: Secondary | ICD-10-CM | POA: Diagnosis not present

## 2018-02-06 DIAGNOSIS — M352 Behcet's disease: Secondary | ICD-10-CM | POA: Diagnosis not present

## 2018-02-06 DIAGNOSIS — Z79899 Other long term (current) drug therapy: Secondary | ICD-10-CM | POA: Diagnosis not present

## 2018-03-05 DIAGNOSIS — Z01419 Encounter for gynecological examination (general) (routine) without abnormal findings: Secondary | ICD-10-CM | POA: Diagnosis not present

## 2018-03-05 DIAGNOSIS — Z130 Encounter for screening for diseases of the blood and blood-forming organs and certain disorders involving the immune mechanism: Secondary | ICD-10-CM | POA: Diagnosis not present

## 2018-04-10 DIAGNOSIS — L910 Hypertrophic scar: Secondary | ICD-10-CM | POA: Diagnosis not present

## 2018-04-10 DIAGNOSIS — L700 Acne vulgaris: Secondary | ICD-10-CM | POA: Diagnosis not present

## 2018-04-10 DIAGNOSIS — D485 Neoplasm of uncertain behavior of skin: Secondary | ICD-10-CM | POA: Diagnosis not present

## 2018-04-25 DIAGNOSIS — R002 Palpitations: Secondary | ICD-10-CM | POA: Diagnosis not present

## 2018-05-21 ENCOUNTER — Ambulatory Visit: Payer: 59 | Admitting: Neurology

## 2018-05-21 ENCOUNTER — Encounter: Payer: Self-pay | Admitting: Neurology

## 2018-05-21 VITALS — BP 117/77 | HR 97 | Ht 59.0 in | Wt 124.5 lb

## 2018-05-21 DIAGNOSIS — M419 Scoliosis, unspecified: Secondary | ICD-10-CM | POA: Insufficient documentation

## 2018-05-21 DIAGNOSIS — Z8669 Personal history of other diseases of the nervous system and sense organs: Secondary | ICD-10-CM | POA: Diagnosis not present

## 2018-05-21 DIAGNOSIS — G4721 Circadian rhythm sleep disorder, delayed sleep phase type: Secondary | ICD-10-CM

## 2018-05-21 DIAGNOSIS — M549 Dorsalgia, unspecified: Secondary | ICD-10-CM

## 2018-05-21 DIAGNOSIS — M352 Behcet's disease: Secondary | ICD-10-CM

## 2018-05-21 NOTE — Progress Notes (Signed)
[facility]  PATIENT: Jacqueline Simpson DOB: 1995/05/23  REFERRING DOCTOR OR PCP:  Dr. Leonie Man (Neuro); Dr. Lenna Gilford (Rheum) SOURCE: patient, notes in EMR including recent hospitalization, Lab and MRI reports, MRI images personally reviewed..  _________________________________   HISTORICAL  CHIEF COMPLAINT:  Chief Complaint  Patient presents with  . Follow-up    RM 12 with [facility]. Last seen 11/08/2017. Taking Cimzia for the Behcet syndrome.  Denies any new sx.     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  Update 05/21/2018: She remains on Cimzia for her Neuro-Behcets (followed by Dr. Lenna Gilford - Rheum).  She feels she is stable.    She has some numbness and tingling since she twisted her right arm a few months ago.    Gait is doing well.    No ataxia or weakness.    No recent oral or genital lesions.     Joint pain is mild - some ankle pain, worse in cold weather.   As a child, she had right optic neuritis and has some color desaturation OD.    She has delayed phase sleep syndrome falling asleep around 4 am and waking up around 1030-11 am.   She recently graduated a IT consultant.      She is noticing more pain mid and upper back.  She has been diagnosed with scoliosis in the past.  She wants orthopedics for this when she was a teenager.   From 11/08/2017: She was diagnosed with Neuro-Behcet's around age 28.    She actually had mouth ulcers, dizziness and vomiting episodes multiple times as a younger child and had optic neuritis at age 36.   She had an MRI after the ON and was originally diagnosed with MS and placed on MS medications.   After she also had genital ulcers and more oral ulcers a biopsy was performed consistent with vasculitis.    Since a relapse in 2011, she has had speech slurring.     She saw Dr. Normajean Baxter (Rheum) and Dr. Ebony Cargo (Neuro) in [place] around age 43 and was diagnosed with Neuro-Behcet's.     She had been on Humira (broke through with 2011 relapse) and Remicade (had possible allergic reaction) in the past.    She is on Cimzia (anti-TNF) and is doing well for the most part x several years.   She gets some mouth ulcers but no recent genital lesions.  She has some joint pain in the wrists and knees.     She has sleep onset insomnia worse than sleep maintenance.   She has a delayed phase sleep  She has depression and anxiety (especially social).   She is on prn ativan.    She had been on Paxil but it did not help.    She prefers not to be on a daily medication for mood..    I personally reviewed the MRIs of the brain dated 02/12/2010, 03/28/2016 and 10/26/2017.  The 2011 MRI showed an enhancing focus near the fourth ventricle in the left cerebellar hemisphere.  Additionally there were nonspecific white matter foci in the hemispheres and cerebellum.  The 2017 and 2019 MRIs showed a stable pattern of nonspecific foci in the cerebral and cerebellar white matter.  The cerebellar lesion was smaller and no longer enhanced on those 2 more recent  MRIs.   Review of labs is unremarkable   REVIEW OF SYSTEMS: Constitutional: No fevers, chills, sweats, or change  in appetite.  Has insomnia Eyes: No visual changes, double vision, eye pain Ear, nose and throat: No hearing loss, ear pain, nasal congestion, sore throat Cardiovascular: No chest pain, palpitations Respiratory: No shortness of breath at rest or with exertion.   No wheezes GastrointestinaI: No nausea, vomiting, diarrhea, abdominal pain, fecal incontinence Genitourinary: No dysuria, urinary retention or frequency.  No nocturia. Musculoskeletal: No neck pain, back pain Integumentary: No rash, pruritus, skin lesions Neurological: as above Psychiatric: No depression at this time.  No anxiety Endocrine: No palpitations, diaphoresis, change in appetite, change in weigh or increased thirst Hematologic/Lymphatic: No anemia, purpura,  petechiae. Allergic/Immunologic: No itchy/runny eyes, nasal congestion, recent allergic reactions, rashes  ALLERGIES: Allergies  Allergen Reactions  . Colchicine Shortness Of Breath  . Milk-Related Compounds Other (See Comments)    GI UPSET  . Sulfa Antibiotics Hives and Swelling    SWELLING LIPS  . Adhesive [Tape] Other (See Comments)    SKIN IRRITATION  . Soap Itching    HOME MEDICATIONS:  Current Outpatient Medications:  .  Certolizumab Pegol (CIMZIA PREFILLED) 2 X 200 MG/ML KIT, Inject 200 mg into the skin See admin instructions. Every 10 days (Patient taking differently: Inject 200 mg into the skin every 14 (fourteen) days. ), Disp: 1 kit, Rfl:  .  EPINEPHrine (EPIPEN JR) 0.15 MG/0.3ML injection, Inject 0.15 mg into the muscle as needed for anaphylaxis. , Disp: , Rfl:  .  LORazepam (ATIVAN) 1 MG tablet, Take 1 tablet (1 mg total) by mouth daily as needed for anxiety., Disp: 30 tablet, Rfl: 1 .  norgestimate-ethinyl estradiol (ORTHO-CYCLEN, 28,) 0.25-35 MG-MCG tablet, Take 1 tablet by mouth at bedtime., Disp: 1 Package, Rfl:  .  pantoprazole (PROTONIX) 40 MG tablet, Take 1 tablet (40 mg total) by mouth daily at 6 (six) AM., Disp: 30 tablet, Rfl: 0  PAST MEDICAL HISTORY: Past Medical History:  Diagnosis Date  . Anxiety   . Behcet's disease ([place])    hx. brain lesions, mouth and skin ulcers  . Depression   . History of MRSA infection 2015   arms, legs  . History of seizures as a child    due to Behcet's disease - no seizures since age 89  . Laceration of wrist with tendon involvement 16/57/9038   self-inflicted  . Social anxiety disorder     PAST SURGICAL HISTORY: Past Surgical History:  Procedure Laterality Date  . ARTERY AND TENDON REPAIR Left 03/04/2014   Procedure: ARTERY AND TENDON REPAIR;  Surgeon: Leanora Cover, MD;  Location: [place];  Service: Orthopedics;  Laterality: Left;  . LUMBAR PUNCTURE  04/28/2003   under anesthesia  . WOUND  EXPLORATION Left 03/04/2014   Procedure: LEFT WRIST EXPLORATION;  Surgeon: Leanora Cover, MD;  Location: [place];  Service: Orthopedics;  Laterality: Left;    FAMILY HISTORY: Family History  Problem Relation Age of Onset  . Depression Mother   . Graves' disease Mother   . ADD / ADHD Brother   . OCD Brother   . Bipolar disorder Maternal Uncle     SOCIAL HISTORY:  Social History   Socioeconomic History  . Marital status: Single    Spouse name: Not on file  . Number of children: Not on file  . Years of education: Not on file  . Highest education level: Not on file  Occupational History  . Occupation: unemployed  Social Needs  . Financial resource strain: Not on file  . Food insecurity:  Worry: Not on file    Inability: Not on file  . Transportation needs:    Medical: Not on file    Non-medical: Not on file  Tobacco Use  . Smoking status: Current Every Day Smoker    Years: 0.50    Types: E-cigarettes  . Smokeless tobacco: Never Used  . Tobacco comment: use everyday  Substance and Sexual Activity  . Alcohol use: Yes    Alcohol/week: 1.0 standard drinks    Types: 1 Glasses of wine per week    Comment: occasionally 1 drink every few months  . Drug use: Yes    Types: Marijuana    Comment: have not use in over 1  year  . Sexual activity: Not Currently    Partners: Female, Female  Lifestyle  . Physical activity:    Days per week: Not on file    Minutes per session: Not on file  . Stress: Not on file  Relationships  . Social connections:    Talks on phone: Not on file    Gets together: Not on file    Attends religious service: Not on file    Active member of club or organization: Not on file    Attends meetings of clubs or organizations: Not on file    Relationship status: Not on file  . Intimate partner violence:    Fear of current or ex partner: Not on file    Emotionally abused: Not on file    Physically abused: Not on file    Forced sexual  activity: Not on file  Other Topics Concern  . Not on file  Social History Narrative  . Not on file     PHYSICAL EXAM  Vitals:   05/21/18 1131  BP: 117/77  Pulse: 97  Weight: 124 lb 8 oz (56.5 kg)  Height: _0  (1.499 m)    Body mass index is 25.15 kg/m.   General: The patient is well-developed and well-nourished and in no acute distress  Eyes:  Funduscopic exam shows optic nerve pallor on the left.  Neck: The neck is supple, no carotid bruits are noted.  The neck is nontender.  Cardiovascular: The heart has a regular rate and rhythm with a normal S1 and S2. There were no murmurs, gallops or rubs. Lungs are clear to auscultation.  Skin: Extremities are without significant edema.  Musculoskeletal:  Back is nontender  Neurologic Exam  Mental status: The patient is alert and oriented x 3 at the time of the examination. The patient has apparent normal recent and remote memory, with an apparently normal attention span and concentration ability.   Speech is normal.  Cranial nerves: Extraocular movements are full.  She has a slight APD on the right.  Color vision was mildly reduced OD.  Facial symmetry is present.  Facial strength is normal.  Trapezius strength was normal.  The tongue is midline, and the patient has symmetric elevation of the soft palate. No obvious hearing deficits are noted.  Motor:  Muscle bulk is normal.   Tone is normal. Strength is  5 / 5 in all 4 extremities.   Sensory: Sensory testing is intact to pinprick, soft touch and vibration sensation in all 4 extremities.  Coordination: Cerebellar testing reveals good finger-nose-finger and heel-to-shin bilaterally.  Gait and station: Station is normal.   The gait is normal.  Tandem gait is slightly wide.  Romberg is negative. Reflexes: Deep tendon reflexes are symmetric and normal bilaterally.   Plantar responses  are flexor.    DIAGNOSTIC DATA (LABS, IMAGING, TESTING) - I reviewed patient records, labs,  notes, testing and imaging myself where available.  Lab Results  Component Value Date   WBC 8.7 10/27/2017   HGB 11.9 (L) 10/27/2017   HCT 37.3 10/27/2017   MCV 84.8 10/27/2017   PLT 226 10/27/2017      Component Value Date/Time   NA 138 10/27/2017 0723   K 4.1 10/27/2017 0723   CL 108 10/27/2017 0723   CO2 20 (L) 10/27/2017 0723   GLUCOSE 91 10/27/2017 0723   BUN 9 10/27/2017 0723   CREATININE 0.53 10/27/2017 0723   CALCIUM 9.0 10/27/2017 0723   PROT 8.9 (H) 02/28/2014 1230   ALBUMIN 3.9 02/28/2014 1230   AST 18 02/28/2014 1230   ALT 14 02/28/2014 1230   ALKPHOS 46 02/28/2014 1230   BILITOT <0.2 (L) 02/28/2014 1230   GFRNONAA >60 10/27/2017 0723   GFRAA >60 10/27/2017 0723       ASSESSMENT AND PLAN  Behcet's disease ([place])  H/O optic neuritis  Scoliosis, unspecified scoliosis type, unspecified spinal region - Plan: Ambulatory referral to Orthopedic Surgery  Upper back pain - Plan: Ambulatory referral to Orthopedic Surgery  Delayed sleep phase syndrome  1.  She will continue to see Dr. Lenna Gilford of rheumatology for her Cimzia .  The neuro-Behcet's appears stable. 2.   She is advised to call us if she believes she is going into a flare for IV steroids and, depending on symptoms, MRI. 3.    Referral to orthopedics for her scoliosis for an evaluation.   4.   She has delayed phase sleep syndrome.  She is not working right now but hopes to start working soon and this could be more of a problem then.  We discussed strategies to try to correct the delayed phase sleep syndrome.   5.   she will return to see me in 12 months or sooner if there are new or worsening neurologic symptoms.     Townes Fuhs A. [name] Shelling, MD, [facility] 05/17/8784, 76:72 PM Certified in Neurology, Clinical Neurophysiology, Sleep Medicine, Pain Medicine and Neuroimaging  [facility] [address], [place], [place] [address] [phone]

## 2018-05-22 DIAGNOSIS — L300 Nummular dermatitis: Secondary | ICD-10-CM | POA: Diagnosis not present

## 2018-06-08 ENCOUNTER — Encounter (INDEPENDENT_AMBULATORY_CARE_PROVIDER_SITE_OTHER): Payer: Self-pay | Admitting: Orthopaedic Surgery

## 2018-06-08 ENCOUNTER — Ambulatory Visit (INDEPENDENT_AMBULATORY_CARE_PROVIDER_SITE_OTHER): Payer: 59

## 2018-06-08 ENCOUNTER — Ambulatory Visit (INDEPENDENT_AMBULATORY_CARE_PROVIDER_SITE_OTHER): Payer: 59 | Admitting: Orthopaedic Surgery

## 2018-06-08 VITALS — BP 120/86 | HR 98 | Ht 59.0 in | Wt 120.0 lb

## 2018-06-08 DIAGNOSIS — M419 Scoliosis, unspecified: Secondary | ICD-10-CM | POA: Diagnosis not present

## 2018-06-10 ENCOUNTER — Encounter (INDEPENDENT_AMBULATORY_CARE_PROVIDER_SITE_OTHER): Payer: Self-pay | Admitting: Orthopaedic Surgery

## 2018-06-10 NOTE — Progress Notes (Signed)
Office Visit Note/orthopedic consultation   Patient: Jacqueline Simpson           Date of Birth: 1996/03/29           MRN: [id number] Visit Date: 06/08/2018              Requested by: Britt Bottom, MD [address] [place], [place] [address] PCP: [employer], [employer]: Visit Diagnoses:  1. Scoliosis, unspecified scoliosis type, unspecified spinal region     Plan: We discussed the benefits of a regular activity exercise regiment whether yoga/tennis/elliptical, workout exercise class etc.  Discussed multiple times a week or even brisk walking for its health benefits as well as a stretching program.  No indication for additional imaging at this time she can return if she has increased symptoms.  Thank you for the opportunity to see her in consultation.  Follow-Up Instructions: Return if symptoms worsen or fail to improve.   Orders:  Orders Placed This Encounter  Procedures  . XR SCOLIOSIS EVAL COMPLETE SPINE 2 OR 3 VIEWS  . Ambulatory referral to Physical Therapy   No orders of the defined types were placed in this encounter.     Procedures: No procedures performed   Clinical Data: No additional findings.   Subjective: Chief Complaint  Patient presents with  . Spine - Pain    HPI Scoliosis patient here for consultation from Dr. Garth Bigness office ,23 year old female with Behcet's disease with diagnosis of scoliosis when she was a child.  Noticed some mild discomfort sometimes in the thoracic region sometimes to the thoracolumbar junction.  No lower extremity weakness.  Patient's not been active in any sports.  No associated bowel or bladder symptoms.  Review of Systems optic neuritis on the right as a child related to neuro Behcet's disease.  Patient's been on several medications including Humira, Remicade in the past Cimzia.  Repair of a transverse laceration to the nondominant wrist 2015 by Dr. Fredna Dow.  Positive for depression  history of alcohol abuse, anxiety scoliosis.   Objective: Vital Signs: BP 120/86   Pulse 98   Ht 4\' 11"  (1.499 m)   Wt 120 lb (54.4 kg)   BMI 24.24 kg/m   Physical Exam Constitutional:      Appearance: She is well-developed.  HENT:     Head: Normocephalic.     Right Ear: External ear normal.     Left Ear: External ear normal.  Eyes:     Pupils: Pupils are equal, round, and reactive to light.  Neck:     Thyroid: No thyromegaly.     Trachea: No tracheal deviation.  Cardiovascular:     Rate and Rhythm: Normal rate.  Pulmonary:     Effort: Pulmonary effort is normal.  Abdominal:     Palpations: Abdomen is soft.  Skin:    General: Skin is warm and dry.  Neurological:     Mental Status: She is alert and oriented to person, place, and time.  Psychiatric:        Behavior: Behavior normal.     Ortho Exam forward flexion fingertips to ankle.  Normal extension.  Mild scapular asymmetry.  Minimal clavicular asymmetry.  Upper and lower extremity reflexes are intact good cervical range of motion.  She has flexible thoracolumbar motion.  Is able to heel and toe walk.  Specialty Comments:  No specialty comments available.  Imaging: No results found.   PMFS History: Patient Active Problem List  Diagnosis Date Noted  . Scoliosis 05/21/2018  . Upper back pain 05/21/2018  . Delayed sleep phase syndrome 11/08/2017  . Anxiety 10/26/2017  . Ataxia 10/26/2017  . Tobacco abuse 10/26/2017  . Dizziness   . Behcet's disease ([place]) 02/23/2016  . H/O optic neuritis 02/23/2016  . Social anxiety disorder 05/15/2014  . Alcohol abuse 03/01/2014  . Abscess of right groin 03/08/2013  . MDD (major depressive disorder), single episode, moderate ([place]) 04/18/2012   Past Medical History:  Diagnosis Date  . Anxiety   . Behcet's disease ([place])    hx. brain lesions, mouth and skin ulcers  . Depression   . History of MRSA infection 2015   arms, legs  . History of seizures as a child    due  to Behcet's disease - no seizures since age 33  . Laceration of wrist with tendon involvement 78/58/8502   self-inflicted  . Social anxiety disorder     Family History  Problem Relation Age of Onset  . Depression Mother   . Graves' disease Mother   . ADD / ADHD Brother   . OCD Brother   . Bipolar disorder Maternal Uncle     Past Surgical History:  Procedure Laterality Date  . ARTERY AND TENDON REPAIR Left 03/04/2014   Procedure: ARTERY AND TENDON REPAIR;  Surgeon: Leanora Cover, MD;  Location: [place];  Service: Orthopedics;  Laterality: Left;  . LUMBAR PUNCTURE  04/28/2003   under anesthesia  . WOUND EXPLORATION Left 03/04/2014   Procedure: LEFT WRIST EXPLORATION;  Surgeon: Leanora Cover, MD;  Location: [place];  Service: Orthopedics;  Laterality: Left;   Social History   Occupational History  . Occupation: unemployed  Tobacco Use  . Smoking status: Current Every Day Smoker    Years: 0.50    Types: E-cigarettes  . Smokeless tobacco: Never Used  . Tobacco comment: use everyday  Substance and Sexual Activity  . Alcohol use: Yes    Alcohol/week: 1.0 standard drinks    Types: 1 Glasses of wine per week    Comment: occasionally 1 drink every few months  . Drug use: Yes    Types: Marijuana    Comment: have not use in over 1  year  . Sexual activity: Not Currently    Partners: Female, Female

## 2018-07-02 ENCOUNTER — Ambulatory Visit: Payer: 59 | Attending: Orthopaedic Surgery | Admitting: Physical Therapy

## 2018-07-02 ENCOUNTER — Other Ambulatory Visit: Payer: Self-pay

## 2018-07-02 ENCOUNTER — Encounter: Payer: Self-pay | Admitting: Physical Therapy

## 2018-07-02 DIAGNOSIS — R293 Abnormal posture: Secondary | ICD-10-CM | POA: Insufficient documentation

## 2018-07-02 DIAGNOSIS — M545 Low back pain, unspecified: Secondary | ICD-10-CM

## 2018-07-02 DIAGNOSIS — M6281 Muscle weakness (generalized): Secondary | ICD-10-CM

## 2018-07-02 DIAGNOSIS — G8929 Other chronic pain: Secondary | ICD-10-CM | POA: Diagnosis not present

## 2018-07-02 DIAGNOSIS — M4125 Other idiopathic scoliosis, thoracolumbar region: Secondary | ICD-10-CM | POA: Insufficient documentation

## 2018-07-02 NOTE — Patient Instructions (Signed)
Access Code: [email or web address]  URL: [email or web address]  Date: 07/02/2018  Prepared by: Venetia Night Dontavius Keim   Exercises  Supine Sciatic Nerve Glide - 60 reps - 2 sets - 1x daily - 7x weekly  Supine Pelvic Floor Contract and Release - 10 reps - 3 sets - 10 hold - 1x daily - 7x weekly  Supine Transversus Abdominis Bracing - Hands on Stomach - 10 reps - 3 sets - 10 hold - 1x daily - 7x weekly

## 2018-07-02 NOTE — Therapy (Signed)
[facility] [address] [place], [place], [address] Phone: [phone]   Fax:  [phone]  Physical Therapy Evaluation  Patient Details  Name: Jacqueline Simpson MRN: [id number] Date of Birth: Apr 10, 1996 Referring Provider (PT): Marybelle Killings, MD   Encounter Date: 07/02/2018  PT End of Session - 07/02/18 1342    Visit Number  1    Date for PT Re-Evaluation  08/27/18    Authorization Type  U[employer]    Authorization - Visit Number  1    Authorization - Number of Visits  60    PT Start Time  1150    PT Stop Time  1230    PT Time Calculation (min)  40 min    Activity Tolerance  Patient tolerated treatment well    Behavior During Therapy  [facility] for tasks assessed/performed       Past Medical History:  Diagnosis Date  . Anxiety   . Behcet's disease ([place])    hx. brain lesions, mouth and skin ulcers  . Depression   . History of MRSA infection 2015   arms, legs  . History of seizures as a child    due to Behcet's disease - no seizures since age 88  . Laceration of wrist with tendon involvement 23/76/2831   self-inflicted  . Social anxiety disorder     Past Surgical History:  Procedure Laterality Date  . ARTERY AND TENDON REPAIR Left 03/04/2014   Procedure: ARTERY AND TENDON REPAIR;  Surgeon: Leanora Cover, MD;  Location: [place];  Service: Orthopedics;  Laterality: Left;  . LUMBAR PUNCTURE  04/28/2003   under anesthesia  . WOUND EXPLORATION Left 03/04/2014   Procedure: LEFT WRIST EXPLORATION;  Surgeon: Leanora Cover, MD;  Location: [place];  Service: Orthopedics;  Laterality: Left;    There were no vitals filed for this visit.   Subjective Assessment - 07/02/18 1152    Subjective  Pt was diagnosed with scoliosis as a young child.  She has pain in upper, middle and lower back that is intermittent depending on activity and posture.  Walking increases upper back pain and prolonged  sitting aggravates mid-back pain.    Limitations  Sitting;Walking;Standing;House hold activities    How long can you sit comfortably?  20-30 min    How long can you stand comfortably?  10-20 min    How long can you walk comfortably?  10-20 min    Diagnostic tests  xrays approx 1 month ago    Patient Stated Goals  pain relief, get stronger, tolerate positions for longer periods of time    Currently in Pain?  No/denies   can get up to 10/10   Pain Score  0-No pain         [facility] PT Assessment - 07/02/18 0001      Assessment   Medical Diagnosis  M41.9 (ICD-10-CM) - Scoliosis, unspecified scoliosis type, unspecified spinal region    Referring Provider (PT)  Marybelle Killings, MD    Onset Date/Surgical Date  --   since childhood   Hand Dominance  Right    Next MD Visit  unknown    Prior Therapy  yes, years ago      Precautions   Precautions  None      Restrictions   Weight Bearing Restrictions  No      Balance Screen   Has the patient fallen in the past 6 months  No  [place]  Private residence    Living Arrangements  Parent   sibling, brother   Type of [place] to enter    Entrance Stairs-Number of Steps  [place]  One level      Prior Function   Level of Independence  Independent    Vocation  Unemployed   job Aeronautical engineer   Leisure  play video games, aquariums, crochet      Cognition   Overall Cognitive Status  Within Functional Limits for tasks assessed      Observation/Other Assessments   Focus on Therapeutic Outcomes (FOTO)   39%   31%     Posture/Postural Control   Posture/Postural Control  Postural limitations    Postural Limitations  Left pelvic obliquity   scoliosis present   Posture Comments  scoliosis present, Lt sidebent, Rt rotated      ROM / Strength   AROM / PROM / Strength  AROM;Strength      AROM   Overall AROM Comments  thoracic rotation limited 50%, non painful,  lumbar flexion fingers to superior to ankles, bil SB and extension WNL      Strength   Overall Strength  Within functional limits for tasks performed    Overall Strength Comments  4+/5 bil LEs and bil UEs with exception of shoulder extension 4-/5 bil   unable to initially recruit transversus abdominus/bears down     Flexibility   Soft Tissue Assessment /Muscle Length  yes    Hamstrings  limited 20% bil   neural tension present in calf on Lt     Palpation   Spinal mobility  limited rib springing throughout Rt thoracic, limited facet gapping/flexion Lt L2-L5    Palpation comment  tender to palpation: Lt PSIS, QL, lumbar paraspinals      Balance   Balance Assessed  Yes      High Level Balance   High Level Balance Comments  SLS bil, able to balance 30 sec bil      Functional Gait  Assessment   Gait assessed   --   no gross gait deviations present               Objective measurements completed on examination: See above findings.              PT Education - 07/02/18 1231    Education Details  Access Code: [email or web address]     Person(s) Educated  Patient    Methods  Explanation;Demonstration;Verbal cues;Tactile cues;Handout    Comprehension  Verbalized understanding;Returned demonstration       PT Short Term Goals - 07/02/18 1353      PT SHORT TERM GOAL #1   Title  Pt will be independent in HEP for postural stabilization, neural mobs and UE/LE stretching.    Time  4    Period  Weeks    Status  New    Target Date  07/30/18      PT SHORT TERM GOAL #2   Title  Pt will demonstrate ability to perform proper core contractions to improve lumbar support.    Time  4    Period  Weeks    Status  New    Target Date  07/30/18      PT SHORT TERM GOAL #3   Title  Pt will report overall reduction of pain with activity by at least 20%.  Time  4    Period  Weeks    Status  New    Target Date  07/30/18        PT Long Term Goals - 07/02/18 1407      PT LONG TERM  GOAL #1   Title  Pt will be I in advanced HEP and understand how to progress self.    Time  8    Period  Weeks    Status  New    Target Date  08/27/18      PT LONG TERM GOAL #2   Title  Pt will be able to tolerate standing and walking activities for at least 30 min with pain rating </= 3/10 to improve household tasks and walking for exercise..    Time  8    Period  Weeks    Status  New    Target Date  08/27/18      PT LONG TERM GOAL #3   Title  Pt will be able to tolerate seated tasks for at least 30 min with pain rating </= 3/10.    Time  8    Period  Weeks    Status  New    Target Date  08/27/18      PT LONG TERM GOAL #4   Title  Pt will achieve 5/5 strength rating throughout bil UEs and LEs to improve ability to perform daily tasks with greater ease.    Time  8    Period  Weeks    Status  New    Target Date  08/27/18      PT LONG TERM GOAL #5   Title  Pt will achieve at least 75% rotation in bil thoracic rotation to improve ease with ADLs and bending/reaching tasks.     Time  8    Period  Weeks    Status  New    Target Date  08/27/18             Plan - 07/02/18 1348    Clinical Impression Statement  Pt is referred to PT with a diagnosis of scoliosis which was diagnosed in childhood.  Pt reports intermittent back pain throughout which varies depending on activity/posture.  She admits she has become inactive with regards to purposeful exercises and hopes to reduce pain and get stronger.  She is currently unemployed but job searching which may impact ability to attend PT.  She has scoliosis with Lt lumbar sidebending and Rt thoracic rotation with limited spinal and soft tissue mobility in Lt lumbar and Rt thoracic regions.  She has 4+/5 for most muscle groups throughout UE/LE with exception of 4-/5 for shoulder extension bil.  She limited flexibility in bil hamstrings, Lt LE neural tension, and poor knowledge of core muscle recruitment.  Pt will benefit from manual  therapy, postural re-ed, stretching, ROM, trunk stabilization and functional mobility training to reduce pain and increase her participation in daily activities.    History and Personal Factors relevant to plan of care:  scoliosis    Clinical Presentation  Stable    Clinical Decision Making  Low    Rehab Potential  Excellent    PT Frequency  2x / week    PT Duration  8 weeks    PT Treatment/Interventions  ADLs/Self Care Home Management;Aquatic Therapy;Cryotherapy;Electrical Stimulation;Iontophoresis 4mg /ml Dexamethasone;Moist Heat;Traction;Functional mobility training;Therapeutic exercise;Balance training;Neuromuscular re-education;Patient/family education;Manual techniques;Passive range of motion;Dry needling;Energy conservation;Taping;Spinal Manipulations;Joint Manipulations    PT Next Visit Plan  f/u on HEP,  add hamstring stretch, spinal ROM including open books as tolerated, progress core and begin scapular stab    PT Home Exercise Plan  Access Code: [email or web address]    Consulted and Agree with Plan of Care  Patient       Patient will benefit from skilled therapeutic intervention in order to improve the following deficits and impairments:  Increased fascial restricitons, Improper body mechanics, Pain, Decreased mobility, Increased muscle spasms, Postural dysfunction, Decreased activity tolerance, Decreased endurance, Decreased range of motion, Decreased strength, Hypomobility, Impaired flexibility  Visit Diagnosis: Chronic bilateral low back pain without sciatica - Plan: PT plan of care cert/re-cert  Other idiopathic scoliosis, thoracolumbar region - Plan: PT plan of care cert/re-cert  Abnormal posture - Plan: PT plan of care cert/re-cert  Muscle weakness (generalized) - Plan: PT plan of care cert/re-cert     Problem List Patient Active Problem List   Diagnosis Date Noted  . Scoliosis 05/21/2018  . Upper back pain 05/21/2018  . Delayed sleep phase syndrome 11/08/2017  . Anxiety  10/26/2017  . Ataxia 10/26/2017  . Tobacco abuse 10/26/2017  . Dizziness   . Behcet's disease ([place]) 02/23/2016  . H/O optic neuritis 02/23/2016  . Social anxiety disorder 05/15/2014  . Alcohol abuse 03/01/2014  . Abscess of right groin 03/08/2013  . MDD (major depressive disorder), single episode, moderate ([place]) 04/18/2012    Ettamae Barkett, PT 07/02/18 2:21 PM   [facility] [address] [place], [place], [address] Phone: [phone]   Fax:  [phone]  Name: Jacqueline Simpson MRN: [id number] Date of Birth: 1996-03-31

## 2018-07-09 ENCOUNTER — Encounter: Payer: Self-pay | Admitting: Physical Therapy

## 2018-07-09 ENCOUNTER — Ambulatory Visit: Payer: 59 | Admitting: Physical Therapy

## 2018-07-09 DIAGNOSIS — M4125 Other idiopathic scoliosis, thoracolumbar region: Secondary | ICD-10-CM

## 2018-07-09 DIAGNOSIS — M545 Low back pain: Secondary | ICD-10-CM

## 2018-07-09 DIAGNOSIS — G8929 Other chronic pain: Secondary | ICD-10-CM

## 2018-07-09 DIAGNOSIS — M6281 Muscle weakness (generalized): Secondary | ICD-10-CM

## 2018-07-09 DIAGNOSIS — R293 Abnormal posture: Secondary | ICD-10-CM

## 2018-07-09 NOTE — Patient Instructions (Signed)
Access Code: [email or web address]  URL: [email or web address]  Date: 07/09/2018  Prepared by: Venetia Night Jaylanie Boschee   Exercises  Supine Sciatic Nerve Glide - 60 reps - 2 sets - 1x daily - 7x weekly  Supine Pelvic Floor Contract and Release - 10 reps - 3 sets - 10 hold - 1x daily - 7x weekly  Supine Transversus Abdominis Bracing - Hands on Stomach - 10 reps - 3 sets - 10 hold - 1x daily - 7x weekly  Standing Row with Anchored Resistance - 20 reps - 2 sets - 1x daily - 7x weekly  Single Arm Shoulder Extension with Anchored Resistance - 20 reps - 2 sets - 1x daily - 7x weekly  Supine Shoulder Horizontal Abduction with Resistance - 20 reps - 2 sets - 1x daily - 7x weekly  Standing Shoulder External Rotation with Resistance - 20 reps - 2 sets - 1x daily - 7x weekly  Supine Bridge - 10 reps - 3 sets - 1x daily - 7x weekly  Seated Hamstring Stretch - 10 reps - 3 sets - 1x daily - 7x weekly  Sidelying Thoracic Rotation with Open Book - 10 reps - 1 sets - 1x daily - 7x weekly

## 2018-07-09 NOTE — Therapy (Signed)
[facility] [address], [place], [place], [address] Phone: [phone]   Fax:  [phone]  Physical Therapy Treatment  Patient Details  Name: Jacqueline Simpson MRN: [id number] Date of Birth: 1995/07/10 Referring Provider (PT): Marybelle Killings, MD   Encounter Date: 07/09/2018  PT End of Session - 07/09/18 1150    Visit Number  2    Date for PT Re-Evaluation  08/27/18    Authorization Type  UHC    Authorization - Visit Number  2    Authorization - Number of Visits  60    PT Start Time  1287    PT Stop Time  1225    PT Time Calculation (min)  40 min    Activity Tolerance  Patient tolerated treatment well    Behavior During Therapy  [facility] for tasks assessed/performed       Past Medical History:  Diagnosis Date  . Anxiety   . Behcet's disease ([place])    hx. brain lesions, mouth and skin ulcers  . Depression   . History of MRSA infection 2015   arms, legs  . History of seizures as a child    due to Behcet's disease - no seizures since age 12  . Laceration of wrist with tendon involvement 86/76/7209   self-inflicted  . Social anxiety disorder     Past Surgical History:  Procedure Laterality Date  . ARTERY AND TENDON REPAIR Left 03/04/2014   Procedure: ARTERY AND TENDON REPAIR;  Surgeon: Leanora Cover, MD;  Location: [place];  Service: Orthopedics;  Laterality: Left;  . LUMBAR PUNCTURE  04/28/2003   under anesthesia  . WOUND EXPLORATION Left 03/04/2014   Procedure: LEFT WRIST EXPLORATION;  Surgeon: Leanora Cover, MD;  Location: [place];  Service: Orthopedics;  Laterality: Left;    There were no vitals filed for this visit.  Subjective Assessment - 07/09/18 1146    Subjective  Pt doing well.  Doing HEP.    Limitations  Sitting;Walking;Standing;House hold activities    How long can you sit comfortably?  20-30 min    How long can you stand comfortably?  10-20 min    How long can  you walk comfortably?  10-20 min    Diagnostic tests  xrays approx 1 month ago    Patient Stated Goals  pain relief, get stronger, tolerate positions for longer periods of time    Currently in Pain?  No/denies                       [facility] Adult PT Treatment/Exercise - 07/09/18 0001      Exercises   Exercises  Lumbar;Knee/Hip;Shoulder      Lumbar Exercises: Stretches   Active Hamstring Stretch  Left;Right;1 rep;30 seconds    Active Hamstring Stretch Limitations  with strap   added seated ham to HEP     Lumbar Exercises: Aerobic   Nustep  L3 x 3', L2 x 2'   PT present to monitor, Pt fatigued in legs at L3     Lumbar Exercises: Seated   Long Arc Quad on [place]  20 reps;Right;Left;AROM    LAQ on [place] Limitations  green ball, PT cued core and slow down    Hip Flexion on Ball  AROM;Both;20 reps    Hip Flexion on Ball Limitations  fingertips on ball      Lumbar Exercises: Supine   Bridge  10 reps  Bridge Limitations  mild cramping bil hamstrings reported      Lumbar Exercises: Sidelying   Other Sidelying Lumbar Exercises  open books x 5 bil      Shoulder Exercises: Seated   Row  Strengthening;Both;Theraband;20 reps    Theraband Level (Shoulder Row)  Level 2 (Red)    Row Limitations  seated on green ball      Shoulder Exercises: Standing   Horizontal ABduction  Strengthening;Both;20 reps;Theraband    Theraband Level (Shoulder Horizontal ABduction)  Level 2 (Red)    External Rotation  Strengthening;Both;20 reps;Theraband    Theraband Level (Shoulder External Rotation)  Level 2 (Red)    External Rotation Limitations  PT cued standing posture and core    Extension  Strengthening;Both;20 reps;Theraband    Theraband Level (Shoulder Extension)  Level 2 (Red)    Extension Limitations  PT cued core and standing posture             PT Education - 07/09/18 1222    Education Details  Access Code: [email or web address]    Person(s) Educated  Patient    Methods   Explanation;Demonstration;Verbal cues;Handout    Comprehension  Verbalized understanding;Returned demonstration       PT Short Term Goals - 07/09/18 1151      PT SHORT TERM GOAL #1   Title  Pt will be independent in HEP for postural stabilization, neural mobs and UE/LE stretching.    Status  On-going      PT SHORT TERM GOAL #2   Title  Pt will demonstrate ability to perform proper core contractions to improve lumbar support.    Status  On-going        PT Long Term Goals - 07/02/18 1407      PT LONG TERM GOAL #1   Title  Pt will be I in advanced HEP and understand how to progress self.    Time  8    Period  Weeks    Status  New    Target Date  08/27/18      PT LONG TERM GOAL #2   Title  Pt will be able to tolerate standing and walking activities for at least 30 min with pain rating </= 3/10 to improve household tasks and walking for exercise..    Time  8    Period  Weeks    Status  New    Target Date  08/27/18      PT LONG TERM GOAL #3   Title  Pt will be able to tolerate seated tasks for at least 30 min with pain rating </= 3/10.    Time  8    Period  Weeks    Status  New    Target Date  08/27/18      PT LONG TERM GOAL #4   Title  Pt will achieve 5/5 strength rating throughout bil UEs and LEs to improve ability to perform daily tasks with greater ease.    Time  8    Period  Weeks    Status  New    Target Date  08/27/18      PT LONG TERM GOAL #5   Title  Pt will achieve at least 75% rotation in bil thoracic rotation to improve ease with ADLs and bending/reaching tasks.     Time  8    Period  Weeks    Status  New    Target Date  08/27/18  Plan - 07/09/18 1224    Clinical Impression Statement  Pt with need for min tactile cueing for improved standing posture today to avoid "hanging on lumbar spine."  PT targeted postural strengthening today with Pt report of feeling really good as she left.  Updated HEP for standing UE tband with core overlay and  supine core.  Pt will continue to benefit from skilled PT to progress postural re-ed, spine ROM, stretching and core stabilization.    PT Treatment/Interventions  ADLs/Self Care Home Management;Aquatic Therapy;Cryotherapy;Electrical Stimulation;Iontophoresis 4mg /ml Dexamethasone;Moist Heat;Traction;Functional mobility training;Therapeutic exercise;Balance training;Neuromuscular re-education;Patient/family education;Manual techniques;Passive range of motion;Dry needling;Energy conservation;Taping;Spinal Manipulations;Joint Manipulations    PT Next Visit Plan  f/u on HEP, add hamstring stretch, spinal ROM including open books as tolerated, progress core and begin scapular stab    PT Home Exercise Plan  Access Code: [email or web address]    Consulted and Agree with Plan of Care  Patient       Patient will benefit from skilled therapeutic intervention in order to improve the following deficits and impairments:     Visit Diagnosis: Other idiopathic scoliosis, thoracolumbar region  Chronic bilateral low back pain without sciatica  Abnormal posture  Muscle weakness (generalized)     Problem List Patient Active Problem List   Diagnosis Date Noted  . Scoliosis 05/21/2018  . Upper back pain 05/21/2018  . Delayed sleep phase syndrome 11/08/2017  . Anxiety 10/26/2017  . Ataxia 10/26/2017  . Tobacco abuse 10/26/2017  . Dizziness   . Behcet's disease ([place]) 02/23/2016  . H/O optic neuritis 02/23/2016  . Social anxiety disorder 05/15/2014  . Alcohol abuse 03/01/2014  . Abscess of right groin 03/08/2013  . MDD (major depressive disorder), single episode, moderate ([place]) 04/18/2012    Emad Brechtel, PT 07/09/18 12:27 PM   [facility] [address], [place], [place], [address] Phone: [phone]   Fax:  [phone]  Name: MICKAELA STARLIN MRN: [id number] Date of Birth: Jan 16, 1996

## 2018-07-16 ENCOUNTER — Encounter: Payer: Self-pay | Admitting: Physical Therapy

## 2018-07-16 ENCOUNTER — Ambulatory Visit: Payer: 59 | Attending: Orthopaedic Surgery | Admitting: Physical Therapy

## 2018-07-16 DIAGNOSIS — M4125 Other idiopathic scoliosis, thoracolumbar region: Secondary | ICD-10-CM | POA: Diagnosis not present

## 2018-07-16 DIAGNOSIS — M6281 Muscle weakness (generalized): Secondary | ICD-10-CM | POA: Insufficient documentation

## 2018-07-16 DIAGNOSIS — M545 Low back pain: Secondary | ICD-10-CM | POA: Insufficient documentation

## 2018-07-16 DIAGNOSIS — G8929 Other chronic pain: Secondary | ICD-10-CM | POA: Insufficient documentation

## 2018-07-16 DIAGNOSIS — R293 Abnormal posture: Secondary | ICD-10-CM | POA: Insufficient documentation

## 2018-07-16 NOTE — Therapy (Signed)
[facility] [address] [place], [place], [address] Phone: [phone]   Fax:  [phone]  Physical Therapy Treatment  Patient Details  Name: Jacqueline Simpson MRN: [id number] Date of Birth: May 30, 1995 Referring Provider (PT): Marybelle Killings, MD   Encounter Date: 07/16/2018  PT End of Session - 07/16/18 1152    Visit Number  3    Date for PT Re-Evaluation  08/27/18    Authorization Type  UHC    Authorization - Visit Number  3    Authorization - Number of Visits  60    PT Start Time  3016    PT Stop Time  0109    PT Time Calculation (min)  39 min    Activity Tolerance  Patient tolerated treatment well;No increased pain    Behavior During Therapy  WFL for tasks assessed/performed       Past Medical History:  Diagnosis Date  . Anxiety   . Behcet's disease ([place])    hx. brain lesions, mouth and skin ulcers  . Depression   . History of MRSA infection 2015   arms, legs  . History of seizures as a child    due to Behcet's disease - no seizures since age 63  . Laceration of wrist with tendon involvement 32/35/5732   self-inflicted  . Social anxiety disorder     Past Surgical History:  Procedure Laterality Date  . ARTERY AND TENDON REPAIR Left 03/04/2014   Procedure: ARTERY AND TENDON REPAIR;  Surgeon: Leanora Cover, MD;  Location: [facility];  Service: Orthopedics;  Laterality: Left;  . LUMBAR PUNCTURE  04/28/2003   under anesthesia  . WOUND EXPLORATION Left 03/04/2014   Procedure: LEFT WRIST EXPLORATION;  Surgeon: Leanora Cover, MD;  Location: [facility];  Service: Orthopedics;  Laterality: Left;    There were no vitals filed for this visit.  Subjective Assessment - 07/16/18 1148    Subjective  Pt states things are going well. She is having some joint pain in the past few days.     Limitations  Sitting;Walking;Standing;House hold activities    How long can you sit comfortably?   20-30 min    How long can you stand comfortably?  10-20 min    How long can you walk comfortably?  10-20 min    Diagnostic tests  xrays approx 1 month ago    Patient Stated Goals  pain relief, get stronger, tolerate positions for longer periods of time    Currently in Pain?  --   Rt foot                      [facility] Adult PT Treatment/Exercise - 07/16/18 0001      Lumbar Exercises: Seated   Other Seated Lumbar Exercises  trunk rotation stretch x10 reps each side 5 sec hold       Lumbar Exercises: Supine   Other Supine Lumbar Exercises  serratus press with red TB x15 reps       Lumbar Exercises: Sidelying   Other Sidelying Lumbar Exercises  open book x10 reps each       Lumbar Exercises: Prone   Other Prone Lumbar Exercises  3x10 sec plank on knees/elbows       Lumbar Exercises: Quadruped   Madcat/Old Horse  15 reps    Madcat/Old Horse Limitations  requires cues to increase lumbo/pelvic control     Straight Leg Raise  10 reps  Straight Leg Raises Limitations  tactile cues needed initially       Shoulder Exercises: Seated   Diagonals  Strengthening;Both;10 reps;Theraband    Theraband Level (Shoulder Diagonals)  Level 2 (Red)    Other Seated Exercises  thoracic flexion/extension x10 reps; lateral flexion x10 reps each      Shoulder Exercises: Standing   Row  Strengthening;Both;15 reps    Theraband Level (Shoulder Row)  Level 2 (Red)    Row Limitations  x2 sets       Shoulder Exercises: Stretch   Other Shoulder Stretches  Child's pose stretch x2 reps each 10 sec hold     Other Shoulder Stretches  pec stretch in doorway 2x30 sec              PT Education - 07/16/18 1208    Education Details  technique with therex    Person(s) Educated  Patient    Methods  Explanation    Comprehension  Verbalized understanding       PT Short Term Goals - 07/16/18 1211      PT SHORT TERM GOAL #1   Title  Pt will be independent in HEP for postural stabilization,  neural mobs and UE/LE stretching.    Status  Partially Met      PT SHORT TERM GOAL #2   Title  Pt will demonstrate ability to perform proper core contractions to improve lumbar support.    Status  On-going        PT Long Term Goals - 07/02/18 1407      PT LONG TERM GOAL #1   Title  Pt will be I in advanced HEP and understand how to progress self.    Time  8    Period  Weeks    Status  New    Target Date  08/27/18      PT LONG TERM GOAL #2   Title  Pt will be able to tolerate standing and walking activities for at least 30 min with pain rating </= 3/10 to improve household tasks and walking for exercise..    Time  8    Period  Weeks    Status  New    Target Date  08/27/18      PT LONG TERM GOAL #3   Title  Pt will be able to tolerate seated tasks for at least 30 min with pain rating </= 3/10.    Time  8    Period  Weeks    Status  New    Target Date  08/27/18      PT LONG TERM GOAL #4   Title  Pt will achieve 5/5 strength rating throughout bil UEs and LEs to improve ability to perform daily tasks with greater ease.    Time  8    Period  Weeks    Status  New    Target Date  08/27/18      PT LONG TERM GOAL #5   Title  Pt will achieve at least 75% rotation in bil thoracic rotation to improve ease with ADLs and bending/reaching tasks.     Time  8    Period  Weeks    Status  New    Target Date  08/27/18            Plan - 07/16/18 1154    Clinical Impression Statement  Pt arrived without reports of mid/upper back pain today. She was not able to adhere fully to her  HEP secondary to reports of joint pain these past couple of days. Session focused on combination of exercises to improve flexibility as well as trunk/scapular strength. Pt reported no increase in pain, although she did require cues to improve thoracic muscle activation during today's session. She was encouraged to continue completing as much of her HEP as possible during episodes of joint pain and was given  green TB to further promote strength gains.     PT Treatment/Interventions  ADLs/Self Care Home Management;Aquatic Therapy;Cryotherapy;Electrical Stimulation;Iontophoresis 59m/ml Dexamethasone;Moist Heat;Traction;Functional mobility training;Therapeutic exercise;Balance training;Neuromuscular re-education;Patient/family education;Manual techniques;Passive range of motion;Dry needling;Energy conservation;Taping;Spinal Manipulations;Joint Manipulations    PT Next Visit Plan  f/u on HEP; progress core and scapular stab    PT Home Exercise Plan  Access Code: [email or web address]   Consulted and Agree with Plan of Care  Patient       Patient will benefit from skilled therapeutic intervention in order to improve the following deficits and impairments:     Visit Diagnosis: Other idiopathic scoliosis, thoracolumbar region  Chronic bilateral low back pain without sciatica  Abnormal posture  Muscle weakness (generalized)     Problem List Patient Active Problem List   Diagnosis Date Noted  . Scoliosis 05/21/2018  . Upper back pain 05/21/2018  . Delayed sleep phase syndrome 11/08/2017  . Anxiety 10/26/2017  . Ataxia 10/26/2017  . Tobacco abuse 10/26/2017  . Dizziness   . Behcet's disease ([place] 02/23/2016  . H/O optic neuritis 02/23/2016  . Social anxiety disorder 05/15/2014  . Alcohol abuse 03/01/2014  . Abscess of right groin 03/08/2013  . MDD (major depressive disorder), single episode, moderate ([place] 04/18/2012   12:25 PM,07/16/18 SSherol DadePT, DPT CUbly[facility] [address] [place] [address]Phone: [phone]  Fax:  [phone] Name: AZAMYAH WIESMANMRN: [id number] of Birth: 710/31/1997

## 2018-07-23 ENCOUNTER — Encounter: Payer: Self-pay | Admitting: Physical Therapy

## 2018-07-23 ENCOUNTER — Ambulatory Visit: Payer: 59 | Admitting: Physical Therapy

## 2018-07-23 DIAGNOSIS — M545 Low back pain: Secondary | ICD-10-CM

## 2018-07-23 DIAGNOSIS — M4125 Other idiopathic scoliosis, thoracolumbar region: Secondary | ICD-10-CM

## 2018-07-23 DIAGNOSIS — M6281 Muscle weakness (generalized): Secondary | ICD-10-CM

## 2018-07-23 DIAGNOSIS — G8929 Other chronic pain: Secondary | ICD-10-CM

## 2018-07-23 DIAGNOSIS — R293 Abnormal posture: Secondary | ICD-10-CM

## 2018-07-23 NOTE — Patient Instructions (Signed)
Access Code: [email or web address]  URL: [email or web address]  Date: 07/23/2018  Prepared by: Venetia Night Lexxie Winberg   Exercises  Supine Sciatic Nerve Glide - 60 reps - 2 sets - 1x daily - 7x weekly  Supine Pelvic Floor Contract and Release - 10 reps - 3 sets - 10 hold - 1x daily - 7x weekly  Supine Transversus Abdominis Bracing - Hands on Stomach - 10 reps - 3 sets - 10 hold - 1x daily - 7x weekly  Standing Row with Anchored Resistance - 20 reps - 2 sets - 1x daily - 7x weekly  Single Arm Shoulder Extension with Anchored Resistance - 20 reps - 2 sets - 1x daily - 7x weekly  Supine Shoulder Horizontal Abduction with Resistance - 20 reps - 2 sets - 1x daily - 7x weekly  Standing Shoulder External Rotation with Resistance - 20 reps - 2 sets - 1x daily - 7x weekly  Supine Bridge - 10 reps - 3 sets - 1x daily - 7x weekly  Seated Hamstring Stretch - 10 reps - 3 sets - 1x daily - 7x weekly  Sidelying Thoracic Rotation with Open Book - 10 reps - 1 sets - 1x daily - 7x weekly  Sit to Stand - 10 reps - 3 sets - 1x daily - 7x weekly  Sidelying Hip Abduction - 5 reps - 2 sets - 1x daily - 7x weekly

## 2018-07-23 NOTE — Therapy (Addendum)
[facility] [address], [place], [place], [address] Phone: [phone]   Fax:  [phone]  Physical Therapy Treatment  Patient Details  Name: Jacqueline Simpson MRN: [id number] Date of Birth: [birth year] Referring Provider (PT): Marybelle Killings, MD   Encounter Date: 07/23/2018  PT End of Session - 07/23/18 1153    Visit Number  4    Date for PT Re-Evaluation  08/27/18    Authorization Type  UHC    Authorization - Visit Number  4    Authorization - Number of Visits  60    PT Start Time  1150    PT Stop Time  1232    PT Time Calculation (min)  42 min    Activity Tolerance  Patient tolerated treatment well;No increased pain    Behavior During Therapy  WFL for tasks assessed/performed       Past Medical History:  Diagnosis Date  . Anxiety   . Behcet's disease ([place])    hx. brain lesions, mouth and skin ulcers  . Depression   . History of MRSA infection 2015   arms, legs  . History of seizures as a child    due to Behcet's disease - no seizures since age 79  . Laceration of wrist with tendon involvement 79/06/4095   self-inflicted  . Social anxiety disorder     Past Surgical History:  Procedure Laterality Date  . ARTERY AND TENDON REPAIR Left 03/04/2014   Procedure: ARTERY AND TENDON REPAIR;  Surgeon: Leanora Cover, MD;  Location: [facility];  Service: Orthopedics;  Laterality: Left;  . LUMBAR PUNCTURE  04/28/2003   under anesthesia  . WOUND EXPLORATION Left 03/04/2014   Procedure: LEFT WRIST EXPLORATION;  Surgeon: Leanora Cover, MD;  Location: [facility];  Service: Orthopedics;  Laterality: Left;    There were no vitals filed for this visit.  Subjective Assessment - 07/23/18 1154    Subjective  Pt states she isn't as stiff since starting PT.  Doing HEP.  She can do more activity before feeling any pain.    Limitations  Sitting;Walking;Standing;House hold activities    How long  can you sit comfortably?  20-30 min    How long can you stand comfortably?  10-20 min    How long can you walk comfortably?  10-20 min    Diagnostic tests  xrays approx 1 month ago    Patient Stated Goals  pain relief, get stronger, tolerate positions for longer periods of time    Currently in Pain?  No/denies                       [facility] Adult PT Treatment/Exercise - 07/23/18 0001      Exercises   Exercises  Shoulder;Lumbar;Knee/Hip      Lumbar Exercises: Stretches   Other Lumbar Stretch Exercise  seated thoracic rot 2x10 bil      Lumbar Exercises: Aerobic   Nustep  L3 x 6' PT present to discuss symptoms      Lumbar Exercises: Supine   Straight Leg Raises Limitations  12 reps, PT verbally cued core      Knee/Hip Exercises: Stretches   Active Hamstring Stretch  30 seconds;Both;1 rep    Active Hamstring Stretch Limitations  standing foot on 2nd step    Piriformis Stretch  1 rep;Both;30 seconds    Piriformis Stretch Limitations  seated edge of table    Press photographer  Both;30 seconds;1 rep    Gastroc Stretch Limitations  slant board    Other Knee/Hip Stretches  hip adductor, 1x30, foot on second step, bil      Knee/Hip Exercises: Seated   Sit to Sand  15 reps   blue hip abd band around knees, add to HEP     Knee/Hip Exercises: Sidelying   Hip ABduction  Strengthening;2 sets;5 sets    Hip ABduction Limitations  added to HEP      Shoulder Exercises: Standing   Horizontal ABduction  Strengthening;Both;15 reps;Theraband    Theraband Level (Shoulder Horizontal ABduction)  Level 3 (Green)    External Rotation  Strengthening;10 reps;Both;Theraband    Theraband Level (Shoulder External Rotation)  Level 2 (Red)    Diagonals  Strengthening;Both;5 reps;Theraband   2 sets   Theraband Level (Shoulder Diagonals)  Level 2 (Red)             PT Education - 07/23/18 1233    Education Details  Access Code: [email or web address]     Person(s) Educated  Patient    Methods   Explanation;Verbal cues;Handout    Comprehension  Verbalized understanding;Returned demonstration       PT Short Term Goals - 07/23/18 1155      PT SHORT TERM GOAL #1   Title  Pt will be independent in HEP for postural stabilization, neural mobs and UE/LE stretching.    Status  Achieved      PT SHORT TERM GOAL #2   Title  Pt will demonstrate ability to perform proper core contractions to improve lumbar support.    Status  Achieved      PT SHORT TERM GOAL #3   Title  Pt will report overall reduction of pain with activity by at least 20%.    Status  On-going   10%       PT Long Term Goals - 07/23/18 1236      PT LONG TERM GOAL #1   Title  Pt will be I in advanced HEP and understand how to progress self.    Status  On-going      PT LONG TERM GOAL #2   Title  Pt will be able to tolerate standing and walking activities for at least 30 min with pain rating </= 3/10 to improve household tasks and walking for exercise..    Status  On-going      PT LONG TERM GOAL #3   Title  Pt will be able to tolerate seated tasks for at least 30 min with pain rating </= 3/10.    Status  On-going      PT LONG TERM GOAL #4   Title  Pt will achieve 5/5 strength rating throughout bil UEs and LEs to improve ability to perform daily tasks with greater ease.    Status  On-going      PT LONG TERM GOAL #5   Title  Pt will achieve at least 75% rotation in bil thoracic rotation to improve ease with ADLs and bending/reaching tasks.     Status  On-going            Plan - 07/23/18 1233    Clinical Impression Statement  Pt reports she is tolerating more activity with less pain.  She is pleased with her progress and sometimes has no pain with activities that used to give her pain consistently.  PT progressed LE strengthening for HEP today, adding sit to stand with hip abd band and sidelying hip abd to  address Lt>Rt weakness in this area.  She has been consistent with performance of HEP.  She will cont  to benefit from skilled PT along current POC.    Rehab Potential  Excellent    PT Frequency  2x / week    PT Duration  8 weeks    PT Treatment/Interventions  ADLs/Self Care Home Management;Aquatic Therapy;Cryotherapy;Electrical Stimulation;Iontophoresis 41m/ml Dexamethasone;Moist Heat;Traction;Functional mobility training;Therapeutic exercise;Balance training;Neuromuscular re-education;Patient/family education;Manual techniques;Passive range of motion;Dry needling;Energy conservation;Taping;Spinal Manipulations;Joint Manipulations    PT Next Visit Plan  f/u on HEP; progress core and scapular stab, hip abductors    PT Home Exercise Plan  Access Code: [email or web address]   Consulted and Agree with Plan of Care  Patient       Patient will benefit from skilled therapeutic intervention in order to improve the following deficits and impairments:  Increased fascial restricitons, Improper body mechanics, Pain, Decreased mobility, Increased muscle spasms, Postural dysfunction, Decreased activity tolerance, Decreased endurance, Decreased range of motion, Decreased strength, Hypomobility, Impaired flexibility  Visit Diagnosis: Other idiopathic scoliosis, thoracolumbar region  Chronic bilateral low back pain without sciatica  Abnormal posture  Muscle weakness (generalized)     Problem List Patient Active Problem List   Diagnosis Date Noted  . Scoliosis 05/21/2018  . Upper back pain 05/21/2018  . Delayed sleep phase syndrome 11/08/2017  . Anxiety 10/26/2017  . Ataxia 10/26/2017  . Tobacco abuse 10/26/2017  . Dizziness   . [place]hcet's disease ([place] 02/23/2016  . H/O optic neuritis 02/23/2016  . Social anxiety disorder 05/15/2014  . Alcohol abuse 03/01/2014  . Abscess of right groin 03/08/2013  . MDD (major depressive disorder), single episode, moderate ([place] 04/18/2012    Toron Bowring, PT 07/23/18 12:37 PM  PHYSICAL THERAPY DISCHARGE SUMMARY  Visits from Start of Care: 4  Current  functional level related to goals / functional outcomes: See above   Remaining deficits: See above   Education / Equipment: HEP Plan: Patient agrees to discharge.  Patient goals were partially met. Patient is being discharged due to being pleased with the current functional level.  ?????         JBaruch Merl PT 08/07/18 11:01 AM   [facility] [address] [place] [address]Phone: [phone]  Fax:  [phone] Name: Jacqueline POLSONMRN: [id number] of Birth: [birth year]

## 2018-07-26 DIAGNOSIS — M2550 Pain in unspecified joint: Secondary | ICD-10-CM | POA: Diagnosis not present

## 2018-07-26 DIAGNOSIS — Z79899 Other long term (current) drug therapy: Secondary | ICD-10-CM | POA: Diagnosis not present

## 2018-07-26 DIAGNOSIS — M064 Inflammatory polyarthropathy: Secondary | ICD-10-CM | POA: Diagnosis not present

## 2018-07-26 DIAGNOSIS — M352 Behcet's disease: Secondary | ICD-10-CM | POA: Diagnosis not present

## 2018-07-30 ENCOUNTER — Ambulatory Visit: Payer: 59 | Admitting: Physical Therapy

## 2018-08-06 ENCOUNTER — Encounter: Payer: Self-pay | Admitting: Physical Therapy

## 2018-08-21 ENCOUNTER — Other Ambulatory Visit: Payer: Self-pay | Admitting: Neurology

## 2018-08-21 DIAGNOSIS — F4010 Social phobia, unspecified: Secondary | ICD-10-CM

## 2018-08-22 NOTE — Telephone Encounter (Signed)
 Database Verified LR:  11/08/2017 Qty: 30 Pending appointment: 05/23/2019

## 2019-05-23 ENCOUNTER — Other Ambulatory Visit: Payer: Self-pay

## 2019-05-23 ENCOUNTER — Ambulatory Visit (INDEPENDENT_AMBULATORY_CARE_PROVIDER_SITE_OTHER): Payer: 59 | Admitting: Neurology

## 2019-05-23 ENCOUNTER — Encounter: Payer: Self-pay | Admitting: Neurology

## 2019-05-23 VITALS — BP 90/70 | HR 103 | Temp 97.8°F | Ht 59.0 in | Wt 137.5 lb

## 2019-05-23 DIAGNOSIS — M549 Dorsalgia, unspecified: Secondary | ICD-10-CM | POA: Diagnosis not present

## 2019-05-23 DIAGNOSIS — M352 Behcet's disease: Secondary | ICD-10-CM | POA: Diagnosis not present

## 2019-05-23 DIAGNOSIS — F4010 Social phobia, unspecified: Secondary | ICD-10-CM

## 2019-05-23 DIAGNOSIS — G4721 Circadian rhythm sleep disorder, delayed sleep phase type: Secondary | ICD-10-CM

## 2019-05-23 MED ORDER — LORAZEPAM 1 MG PO TABS: 1.0000 mg | ORAL_TABLET | Freq: Every day | ORAL | 2 refills | Status: DC | PRN

## 2019-05-23 NOTE — Progress Notes (Signed)
[facility]  PATIENT: Jacqueline Simpson DOB: [birth year]  REFERRING DOCTOR OR PCP:  Dr. Leonie Man (Neuro); Dr. Lenna Gilford (Rheum) SOURCE: patient, notes in EMR including recent hospitalization, Lab and MRI reports, MRI images personally reviewed..  _________________________________   HISTORICAL  CHIEF COMPLAINT:  Chief Complaint  Patient presents with  . Follow-up    RM 13, alone. Last seen 05/21/2018. Takes Cimzia for Behcet Syndrome. Needs refill on lorazepam.     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  Update 05/23/2019: She has Neuro-Behcet's syndrome and has done well on Cimzia.   (sees Dr. Lance Bosch).    She has had some oral ulcers.   She denies issues with gait, strength and sensation currently.   Vision is doing well.    Her back pain has done well  She is sleeping much better and now going to bed at 10-11 pm and waking up around 5-6 am.    She is working doing an Geographical information systems officer site (crochets).      She has some anxiety but has had less anxiety the last couple years.   She has scoliosis.  Her back pain is doing better.  Update 05/21/2018: She remains on Cimzia for her Neuro-Behcets (followed by Dr. Lenna Gilford - Rheum).  She feels she is stable.    She has some numbness and tingling since she twisted her right arm a few months ago.    Gait is doing well.    No ataxia or weakness.    No recent oral or genital lesions.     Joint pain is mild - some ankle pain, worse in cold weather.   As a child, she had right optic neuritis and has some color desaturation OD.    She has delayed phase sleep syndrome falling asleep around 4 am and waking up around 1030-11 am.   She recently graduated a IT consultant.      She is noticing more pain mid and upper back.  She has been diagnosed with scoliosis in the past.  She wants orthopedics for this when she was a teenager.   From 11/08/2017: She was diagnosed with Neuro-Behcet's  around age 24.    She actually had mouth ulcers, dizziness and vomiting episodes multiple times as a younger child and had optic neuritis at age 11.   She had an MRI after the ON and was originally diagnosed with MS and placed on MS medications.   After she also had genital ulcers and more oral ulcers a biopsy was performed consistent with vasculitis.    Since a relapse in 2011, she has had speech slurring.     She saw Dr. Normajean Baxter (Rheum) and Dr. Ebony Cargo (Neuro) in [place] around age 24 and was diagnosed with Neuro-Behcet's.    She had been on Humira (broke through with 2011 relapse) and Remicade (had possible allergic reaction) in the past.    She is on Cimzia (anti-TNF) and is doing well for the most part x several years.   She gets some mouth ulcers but no recent genital lesions.  She has some joint pain in the wrists and knees.     She has sleep onset insomnia worse than sleep maintenance.   She has a delayed phase sleep  She has depression and anxiety (especially social).   She is on prn ativan.    She had been on Paxil but it did not help.    She prefers  not to be on a daily medication for mood..    I personally reviewed the MRIs of the brain dated 02/12/2010, 03/28/2016 and 10/26/2017.  The 2011 MRI showed an enhancing focus near the fourth ventricle in the left cerebellar hemisphere.  Additionally there were nonspecific white matter foci in the hemispheres and cerebellum.  The 2017 and 2019 MRIs showed a stable pattern of nonspecific foci in the cerebral and cerebellar white matter.  The cerebellar lesion was smaller and no longer enhanced on those 2 more recent  MRIs.   Review of labs is unremarkable   REVIEW OF SYSTEMS: Constitutional: No fevers, chills, sweats, or change in appetite.  Has insomnia Eyes: No visual changes, double vision, eye pain Ear, nose and throat: No hearing loss, ear pain, nasal congestion, sore throat Cardiovascular: No chest pain, palpitations Respiratory: No shortness of  breath at rest or with exertion.   No wheezes GastrointestinaI: No nausea, vomiting, diarrhea, abdominal pain, fecal incontinence Genitourinary: No dysuria, urinary retention or frequency.  No nocturia. Musculoskeletal: No neck pain, back pain Integumentary: No rash, pruritus, skin lesions Neurological: as above Psychiatric: No depression at this time.  No anxiety Endocrine: No palpitations, diaphoresis, change in appetite, change in weigh or increased thirst Hematologic/Lymphatic: No anemia, purpura, petechiae. Allergic/Immunologic: No itchy/runny eyes, nasal congestion, recent allergic reactions, rashes  ALLERGIES: Allergies  Allergen Reactions  . Colchicine Shortness Of Breath  . Milk-Related Compounds Other (See Comments)    GI UPSET  . Sulfa Antibiotics Hives and Swelling    SWELLING LIPS  . Adhesive [Tape] Other (See Comments)    SKIN IRRITATION  . Soap Itching    HOME MEDICATIONS:  Current Outpatient Medications:  .  Certolizumab Pegol (CIMZIA PREFILLED) 2 X 200 MG/ML KIT, Inject 200 mg into the skin See admin instructions. Every 10 days (Patient taking differently: Inject 200 mg into the skin every 14 (fourteen) days. ), Disp: 1 kit, Rfl:  .  LORazepam (ATIVAN) 1 MG tablet, Take 1 tablet (1 mg total) by mouth daily as needed for anxiety., Disp: 30 tablet, Rfl: 2 .  norgestimate-ethinyl estradiol (ORTHO-CYCLEN, 28,) 0.25-35 MG-MCG tablet, Take 1 tablet by mouth at bedtime., Disp: 1 Package, Rfl:  .  EPINEPHrine (EPIPEN JR) 0.15 MG/0.3ML injection, Inject 0.15 mg into the muscle as needed for anaphylaxis. , Disp: , Rfl:   PAST MEDICAL HISTORY: Past Medical History:  Diagnosis Date  . Anxiety   . Behcet's disease ([place])    hx. brain lesions, mouth and skin ulcers  . Depression   . History of MRSA infection 2015   arms, legs  . History of seizures as a child    due to Behcet's disease - no seizures since age 68  . Laceration of wrist with tendon involvement 51/88/4166    self-inflicted  . Social anxiety disorder     PAST SURGICAL HISTORY: Past Surgical History:  Procedure Laterality Date  . ARTERY AND TENDON REPAIR Left 03/04/2014   Procedure: ARTERY AND TENDON REPAIR;  Surgeon: Leanora Cover, MD;  Location: [facility];  Service: Orthopedics;  Laterality: Left;  . LUMBAR PUNCTURE  04/28/2003   under anesthesia  . WOUND EXPLORATION Left 03/04/2014   Procedure: LEFT WRIST EXPLORATION;  Surgeon: Leanora Cover, MD;  Location: [facility];  Service: Orthopedics;  Laterality: Left;    FAMILY HISTORY: Family History  Problem Relation Age of Onset  . Depression Mother   . Graves' disease Mother   . ADD / ADHD Brother   .  OCD Brother   . Bipolar disorder Maternal Uncle     SOCIAL HISTORY:  Social History   Socioeconomic History  . Marital status: Single    Spouse name: Not on file  . Number of children: Not on file  . Years of education: Not on file  . Highest education level: Not on file  Occupational History  . Occupation: unemployed  Tobacco Use  . Smoking status: Current Every Day Smoker    Years: 0.50    Types: E-cigarettes  . Smokeless tobacco: Never Used  . Tobacco comment: use everyday  Substance and Sexual Activity  . Alcohol use: Yes    Alcohol/week: 1.0 standard drinks    Types: 1 Glasses of wine per week    Comment: occasionally 1 drink every few months  . Drug use: Yes    Types: Marijuana    Comment: have not use in over 1  year  . Sexual activity: Not Currently    Partners: Female, Female  Other Topics Concern  . Not on file  Social History Narrative  . Not on file   Social Determinants of Health   Financial Resource Strain:   . Difficulty of Paying Living Expenses: Not on file  Food Insecurity:   . Worried About Charity fundraiser in the Last Year: Not on file  . Ran Out of Food in the Last Year: Not on file  Transportation Needs:   . Lack of Transportation (Medical): Not on file  .  Lack of Transportation (Non-Medical): Not on file  Physical Activity:   . Days of Exercise per Week: Not on file  . Minutes of Exercise per Session: Not on file  Stress:   . Feeling of Stress : Not on file  Social Connections:   . Frequency of Communication with Friends and Family: Not on file  . Frequency of Social Gatherings with Friends and Family: Not on file  . Attends Religious Services: Not on file  . Active Member of Clubs or Organizations: Not on file  . Attends Archivist Meetings: Not on file  . Marital Status: Not on file  Intimate Partner Violence:   . Fear of Current or Ex-Partner: Not on file  . Emotionally Abused: Not on file  . Physically Abused: Not on file  . Sexually Abused: Not on file     PHYSICAL EXAM  Vitals:   05/23/19 1107  BP: 90/70  Pulse: (!) 103  Temp: 97.8 F (36.6 C)  SpO2: 98%  Weight: 137 lb 8 oz (62.4 kg)  Height: 4' 11"  (1.499 m)    Body mass index is 27.77 kg/m.   General: The patient is well-developed and well-nourished and in no acute distress  Eyes:  Funduscopic exam shows optic nerve pallor on the left.  Neck: The neck is supple, no carotid bruits are noted.  The neck is nontender.  Cardiovascular: The heart has a regular rate and rhythm with a normal S1 and S2. There were no murmurs, gallops or rubs. Lungs are clear to auscultation.  Skin: Extremities are without significant edema.  Musculoskeletal:  Back is nontender  Neurologic Exam  Mental status: The patient is alert and oriented x 3 at the time of the examination. The patient has apparent normal recent and remote memory, with an apparently normal attention span and concentration ability.   Speech is normal.  Cranial nerves: Extraocular movements are full.  She has a slight APD on the right.  Color vision was mildly  reduced OD.  Facial symmetry is present.  Facial strength is normal.  Trapezius strength was normal.  The tongue is midline, and the patient has  symmetric elevation of the soft palate. No obvious hearing deficits are noted.  Motor:  Muscle bulk is normal.   Tone is normal. Strength is  5 / 5 in all 4 extremities.   Sensory: Sensory testing is intact to pinprick, soft touch and vibration sensation in all 4 extremities.  Coordination: Cerebellar testing reveals good finger-nose-finger and heel-to-shin bilaterally.  Gait and station: Station is normal.   The gait is normal.  Tandem gait is slightly wide.  Romberg is negative. Reflexes: Deep tendon reflexes are symmetric and normal bilaterally.   Plantar responses are flexor.    DIAGNOSTIC DATA (LABS, IMAGING, TESTING) - I reviewed patient records, labs, notes, testing and imaging myself where available.  Lab Results  Component Value Date   WBC 8.7 10/27/2017   HGB 11.9 (L) 10/27/2017   HCT 37.3 10/27/2017   MCV 84.8 10/27/2017   PLT 226 10/27/2017      Component Value Date/Time   NA 138 10/27/2017 0723   K 4.1 10/27/2017 0723   CL 108 10/27/2017 0723   CO2 20 (L) 10/27/2017 0723   GLUCOSE 91 10/27/2017 0723   BUN 9 10/27/2017 0723   CREATININE 0.53 10/27/2017 0723   CALCIUM 9.0 10/27/2017 0723   PROT 8.9 (H) 02/28/2014 1230   ALBUMIN 3.9 02/28/2014 1230   AST 18 02/28/2014 1230   ALT 14 02/28/2014 1230   ALKPHOS 46 02/28/2014 1230   BILITOT <0.2 (L) 02/28/2014 1230   GFRNONAA >60 10/27/2017 0723   GFRAA >60 10/27/2017 0723       ASSESSMENT AND PLAN  Behcet's disease ([place])  Social anxiety disorder - Plan: LORazepam (ATIVAN) 1 MG tablet  Delayed sleep phase syndrome  Upper back pain  1.  For neuro Behcet's appears to be well controlled on Cimzia.  She will continue to see Dr. Lenna Gilford of rheumatology for her Cimzia .   2.   She is advised to call us or Dr. Lenna Gilford if she believes she is going into a flare for IV steroids and, depending on symptoms, MRI. 3.   Sleep is doing better.  Continue to have a regular bedtime and try to get 7 to 8 hours of sleep. 4.    Mood is doing better than at the last visit.  I will send in a prescription for lorazepam for the occasional episodes of anxiety..   5.  As she is stable, she will return to see [place] as needed.Nanine Means. [name] Shelling, MD, [facility] 01/18/3201, 3:34 PM Certified in Neurology, Clinical Neurophysiology, Sleep Medicine, Pain Medicine and Neuroimaging  [facility] [address], [employer] [place], [place] [address] [phone]

## 2019-09-20 IMAGING — MR MR HEAD WO/W CM
11 of 13 series · 38 of 48 positions shown · IV contrast (multihance)
Comparison: Brain MRI 03/23/2016

CLINICAL DATA: Dizziness.  History of Paulini disease.

EXAM:
MRI HEAD WITHOUT AND WITH CONTRAST
TECHNIQUE: Multiplanar, multiecho pulse sequences of the brain and surrounding
structures were obtained without and with intravenous contrast.
CONTRAST:  11mL MULTIHANCE GADOBENATE DIMEGLUMINE 529 MG/ML IV SOLN

[Series 5: ax dwi_tracew · axial · 3.0mm · 1.50mm/px · z∈[-71,+55]mm · 7 of 80 slices shown]
[im 1/80]
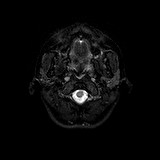
[im 14/80]
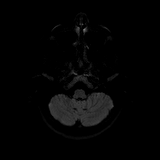
[im 27/80]
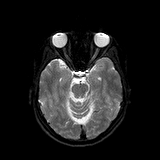
[im 40/80]
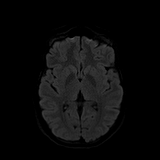
[im 53/80]
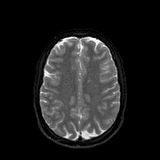
[im 66/80]
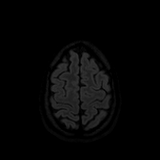
[im 80/80]
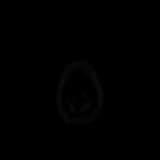

[Series 6: ax dwi_adc · axial · 3.0mm · 1.50mm/px · z∈[-71,+55]mm · 3 of 40 slices shown]
[im 1/40]
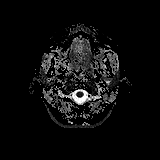
[im 20/40]
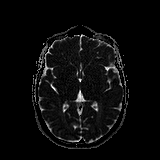
[im 40/40]
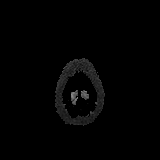

[Series 7: DWI · coronal · 4.0mm · 0.88mm/px · 6 of 68 slices shown (1 of 2)]
[im 1/68]
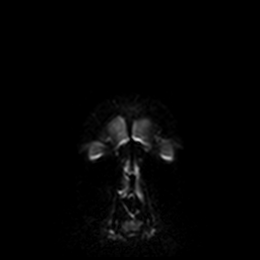
[im 14/68]
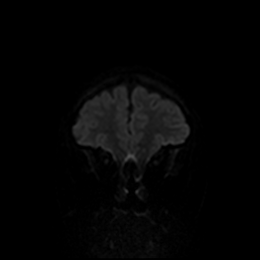
[im 27/68]
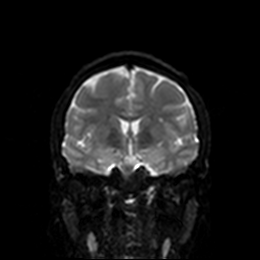
[im 41/68]
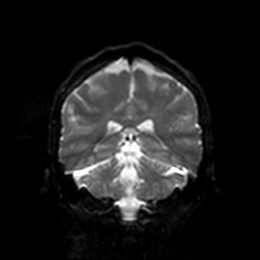
[im 54/68]
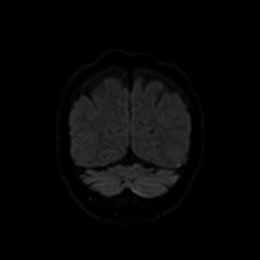
[im 68/68]
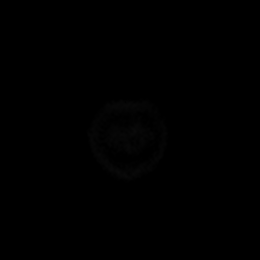

[Series 8: DWI · coronal · 4.0mm · 0.88mm/px · 3 of 34 slices shown (2 of 2)]
[im 1/34]
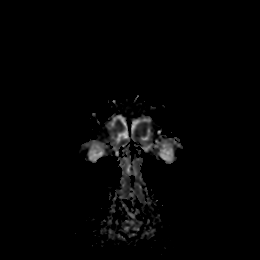
[im 17/34]
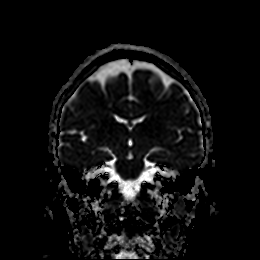
[im 34/34]
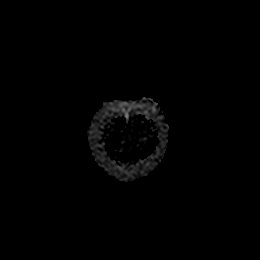

[Series 9: T1 · sagittal · 5.0mm · 0.75mm/px · 2 of 23 slices shown]
[im 1/23]
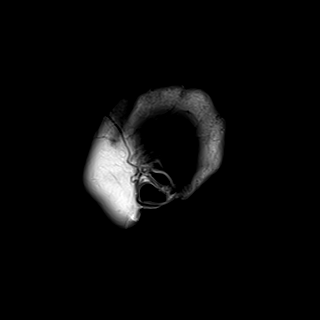
[im 23/23]
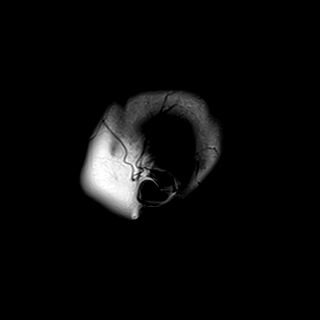

[Series 10: T2 · axial · 5.0mm · 0.69mm/px · z∈[-70,+64]mm · 2 of 25 slices shown]
[im 1/25]
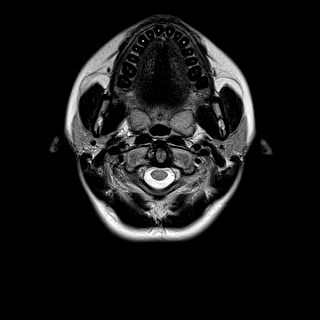
[im 25/25]
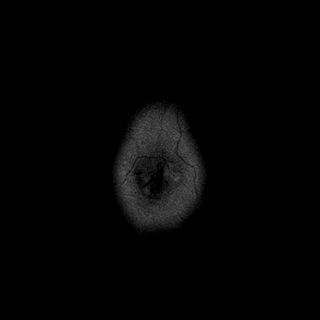

[Series 11: FLAIR · axial · 5.0mm · 0.43mm/px · z∈[-70,+65]mm · 2 of 25 slices shown]
[im 1/25]
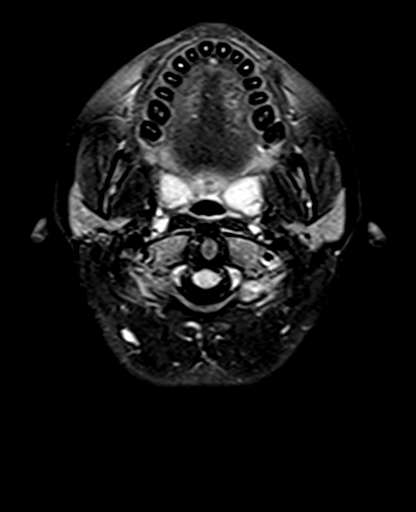
[im 25/25]
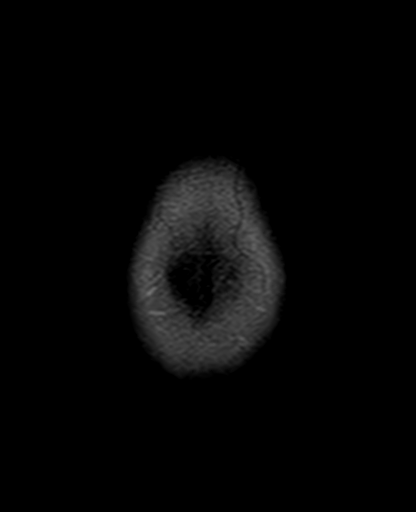

[Series 12: swi_images · axial · 3.0mm · 0.86mm/px · z∈[-79,+86]mm · 5 of 60 slices shown]
[im 1/60]
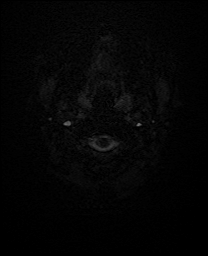
[im 15/60]
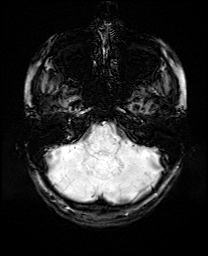
[im 30/60]
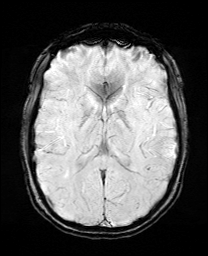
[im 45/60]
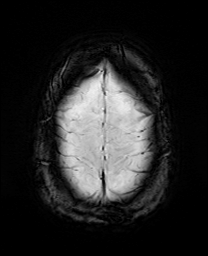
[im 60/60]
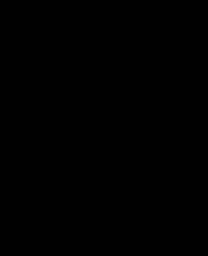

[Series 13: mip_images(sw) · axial · 24.0mm · 0.86mm/px · z∈[-69,+77]mm · 4 of 53 slices shown]
[im 1/53]
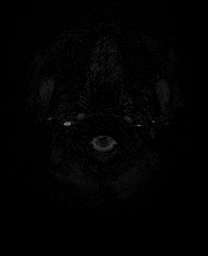
[im 18/53]
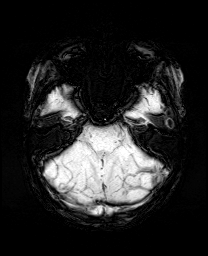
[im 35/53]
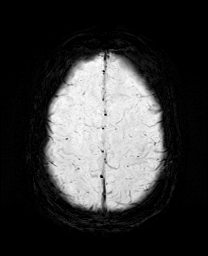
[im 53/53]
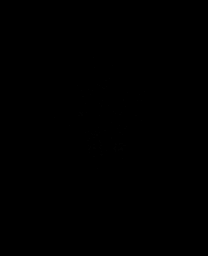

[Series 15: T2 post-contrast · coronal · 5.0mm · 0.72mm/px · 2 of 28 slices shown]
[im 1/28]
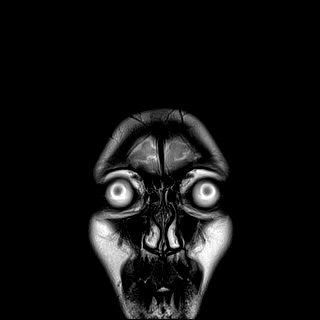
[im 28/28]
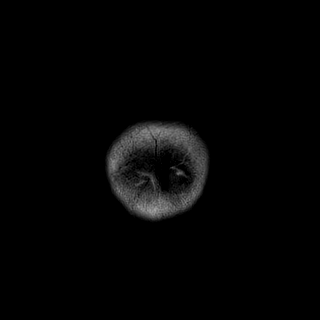

[Series 17: T1 post-contrast · coronal · 5.0mm · 0.34mm/px · 2 of 28 slices shown]
[im 1/28]
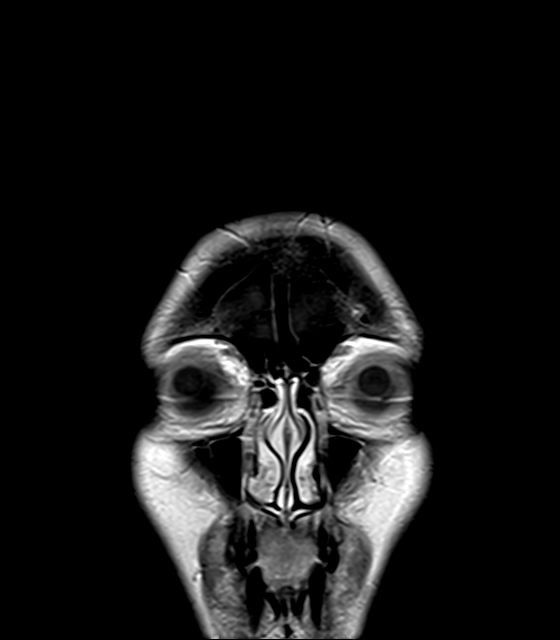
[im 28/28]
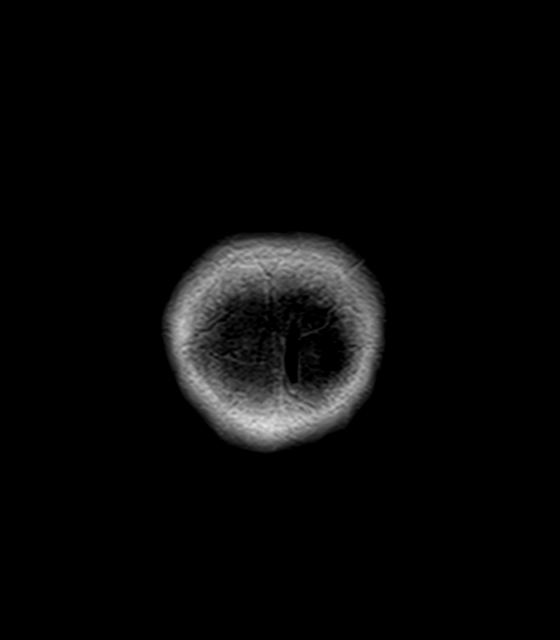

[38 of 48 positions shown; findings below may reference images not displayed]

FINDINGS: BRAIN: There is no acute infarct, acute hemorrhage or mass effect.
The midline structures are normal. There are no old infarcts.
Unchanged distribution of white matter lesions. The CSF spaces are
normal for age, with no hydrocephalus. Susceptibility-sensitive
sequences show no chronic microhemorrhage or superficial siderosis.
No abnormal contrast enhancement.

VASCULAR: Major intracranial arterial and venous sinus flow voids
are preserved.

SKULL AND UPPER CERVICAL SPINE: The visualized skull base,
calvarium, upper cervical spine and extracranial soft tissues are
normal.

SINUSES/ORBITS: No fluid levels or advanced mucosal thickening. No
mastoid or middle ear effusion. The orbits are normal.
IMPRESSION: Unchanged nonspecific distribution of white matter lesions compared
to 03/23/2016 and 02/12/2010. While nonspecific, the findings are
compatible with sequelae of vascular-inflammatory (Paulini Disease)
or demyelinating disease. No diffusion restriction or abnormal
contrast enhancement to indicate an acute process.

## 2019-10-14 ENCOUNTER — Telehealth (HOSPITAL_COMMUNITY): Payer: Self-pay | Admitting: Neurology

## 2019-10-14 DIAGNOSIS — M352 Behcet's disease: Secondary | ICD-10-CM

## 2019-10-14 DIAGNOSIS — R202 Paresthesia of skin: Secondary | ICD-10-CM

## 2019-10-14 NOTE — Telephone Encounter (Signed)
I returned phone call from patient this around 10 am. She noticed right face numbness since y`day without any accompanying symptoms.She has neurobehcet`s and has had these symptoms in past. She wanted to know if she could have an mri brain today. I felt her symptoms were not severe enough to mandate a stat mri brain but to call tomorrow and d/w Dr [name] Shelling for further advice. She was asked to call back or go to ER incase her symptoms worsened or got more severe. She voiced understanding

## 2019-10-15 NOTE — Addendum Note (Signed)
Addended by: Wyvonnia Lora on: 10/15/2019 04:40 PM   Modules accepted: Orders

## 2019-10-15 NOTE — Telephone Encounter (Addendum)
Tried calling pt x 3 at [phone]. Received this message both times: "Due to network difficulties, call cannot be completed." Tried calling [phone]. LVM for pt to call office.  Called mother on [place] at [phone]. LVM.

## 2019-10-15 NOTE — Telephone Encounter (Addendum)
Spoke with Jael who stated pt called from [phone]. I will try this number.  Called pt. She reported that she is still having some facial tingling on right side of face, including tongue, lips. This has been going on since yesterday. The day before yesterday sx started with just her mouth and then progressed. She is agreeable to MRI brain. Advised we will place order for her to complete at [facility]. Looks like she has gone here in the past, she did not have a preference. Once insurance authorized, they will call to get her scheduled. Her parents are on vacation until Friday. She states they will help coordinate this.

## 2019-10-15 NOTE — Telephone Encounter (Signed)
Tried calling pt back at [phone]. Received same automated message "due to network difficulties, call cannot be completed"

## 2019-10-15 NOTE — Telephone Encounter (Signed)
Please see if still having symptoms - if so lets order MRI brain with/without

## 2019-10-15 NOTE — Telephone Encounter (Signed)
Pt returned call. Please call back when available. 

## 2019-10-17 ENCOUNTER — Telehealth: Payer: Self-pay | Admitting: Neurology

## 2019-10-17 NOTE — Telephone Encounter (Signed)
Pt called to advise she would like to get her mri done at [facility]

## 2019-10-17 NOTE — Telephone Encounter (Signed)
[employer] Auth: NPR via [employer] website order faxed to [facility] they will reach out to the patient to schedule

## 2019-10-18 NOTE — Telephone Encounter (Signed)
scheduled at [facility] for 10/29/19.

## 2019-10-18 NOTE — Telephone Encounter (Signed)
Pt is aware of the date her MRI is set for, she would like to know if there is any treatment plan Dr [name] Shelling would like to begin before her MRI date since that seems so far away.  Please call

## 2019-10-21 NOTE — Telephone Encounter (Signed)
Last visit with Dr. [name] Shelling in January 2021:  "1.  For neuro Behcet's appears to be well controlled on Cimzia.  She will continue to see Dr. Lenna Gilford of rheumatology for her Cimzia .   2.   She is advised to call us or Dr. Lenna Gilford if she believes she is going into a flare for IV steroids and, depending on symptoms"  MRI of the brain with without is pending  Last MRI of the brain with without contrast in June 2019 reviewed: Unchanged nonspecific white matter, no significant change compared to November 2017, September 2011  It is okay to wait MRI of the brain scan as planned before any treatment suggestion

## 2019-10-21 NOTE — Telephone Encounter (Signed)
Called pt back at [phone]. Advised Dr. [name] Shelling out until Wednesday and Dr. Krista Blue reviewed her chart. Relayed that per Dr. Garth Bigness last [place] note she should contact Dr. Lenna Gilford about going in for some IV steroids. She verbalized understanding and will do this.

## 2019-10-21 NOTE — Telephone Encounter (Signed)
Dr. Sater- FYI 

## 2019-10-21 NOTE — Telephone Encounter (Signed)
Dr. Krista Blue- do you have any recommendations? Dr. [name] Shelling is out of the office

## 2019-10-22 NOTE — Telephone Encounter (Signed)
Tried calling pt. Unable to LVM. Please offer 0614/21 at 3pm with Dr. [name] Shelling for a work in visit if she calls

## 2019-10-22 NOTE — Telephone Encounter (Signed)
Called pt again. Was able to speak with her. She accepted appt on 10/28/19 at 3:00pm with Dr. [name] Shelling. I asked she check in by 2:30pm. She verbalized understanding.

## 2019-10-22 NOTE — Telephone Encounter (Signed)
Lets see if we can get her in on Monday

## 2019-10-22 NOTE — Telephone Encounter (Addendum)
Called pt back. Advised Dr. [name] Shelling will be back in the office tomorrow. I will speak with him tomorrow to see what he suggests prior to her MRI 10/29/19 and then will call her back. She verbalized understanding. Best number to call her at: [phone].

## 2019-10-22 NOTE — Telephone Encounter (Signed)
Pt called office asking for an appt with Dr. [name] Shelling. She wants to discuss her treatment options. She did call Dr. Jerrye Bushy office yesterday but pt reports that they told her to make an appt with [facility]. Her MRI is scheduled for 10/29/19. While I was searching for an appt with Dr. [name] Shelling, I placed the call on hold, but the patient did not stay on the line. I tried to call pt back but the call would not go through.

## 2019-10-28 ENCOUNTER — Ambulatory Visit: Payer: Self-pay | Admitting: Neurology

## 2019-10-29 ENCOUNTER — Telehealth: Payer: Self-pay | Admitting: Neurology

## 2019-10-29 NOTE — Telephone Encounter (Signed)
Pt's mother called stating that they were not able to give the pt the contrast for her MRI and she is wanting to discuss this with RN. Please advise.

## 2019-10-30 NOTE — Telephone Encounter (Signed)
Where was the MRI actually done - please see if they can send images for me to review.

## 2019-10-30 NOTE — Telephone Encounter (Signed)
Pt's MRI was completed at [facility].  Jacqueline Simpson can you assist with getting the records/ CD? Thanks!

## 2019-10-30 NOTE — Telephone Encounter (Signed)
I reached out to the pt's mom. She sts when the pt went to her MRI appointment on 10/29/19 the tech was unable to find her vein and could not do the contrast. MRI was completed w/o contrast. Mother was frustrated about this but wanted to let the doctor know. Pt has been rescheduled for her f/u for 11/04/2019. Mom sts the pt is still struggling with facial numbness on the right side ( numbness is mostly inside her mouth).

## 2019-10-31 MED ORDER — METHYLPREDNISOLONE 4 MG PO TABS: ORAL_TABLET | ORAL | 0 refills | Status: DC

## 2019-10-31 NOTE — Telephone Encounter (Signed)
I discussed the results of her recent brain MRI.  She has a new lesion in the posterior pons to the right.  She was unable to get contrast but the lesion was not on her previous brain MRI.  She has several other foci in the hemispheres that were all present on the previous MRI.Marland Kitchen  This could easily explain her facial numbness.  Compared to last week, she feels she is doing better, about 50 to 60%.  I will call in a steroid pack in the hope that it will improve her more quickly.  She is scheduled to see me next week

## 2019-11-04 ENCOUNTER — Encounter: Payer: Self-pay | Admitting: Neurology

## 2019-11-04 ENCOUNTER — Other Ambulatory Visit: Payer: Self-pay

## 2019-11-04 ENCOUNTER — Ambulatory Visit: Payer: 59 | Admitting: Neurology

## 2019-11-04 VITALS — BP 112/76 | HR 93 | Wt 141.5 lb

## 2019-11-04 DIAGNOSIS — M352 Behcet's disease: Secondary | ICD-10-CM | POA: Diagnosis not present

## 2019-11-04 DIAGNOSIS — R200 Anesthesia of skin: Secondary | ICD-10-CM

## 2019-11-04 DIAGNOSIS — Z8669 Personal history of other diseases of the nervous system and sense organs: Secondary | ICD-10-CM

## 2019-11-04 DIAGNOSIS — F4010 Social phobia, unspecified: Secondary | ICD-10-CM

## 2019-11-04 MED ORDER — METHYLPREDNISOLONE 4 MG PO TABS: ORAL_TABLET | ORAL | 0 refills | Status: DC

## 2019-11-04 NOTE — Progress Notes (Signed)
[facility]  PATIENT: Jacqueline Simpson DOB: [birth year]  REFERRING DOCTOR OR PCP:  Dr. Leonie Man (Neuro); Dr. Lenna Gilford (Rheum) SOURCE: patient, notes in EMR including recent hospitalization, Lab and MRI reports, MRI images personally reviewed..  _________________________________   HISTORICAL  CHIEF COMPLAINT:  Chief Complaint  Patient presents with  . 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  Update 11/04/2019: She had a new CNS event 3 weeks ago with right facial numbness.   MRI showed a new pontine lesion on the right.   Of note she had other Behcet lesions in the past in the pons:  a Bell's palsy and dysphagia.  She is on Cimzia for Behcet's.   She has had some more genital ulcers.,   No oral sores.   In the past, she had been on Humira, Remicade and Methotrexate  She is sleeping better and no longer takes trazodone.      She has some scoliosis and has LBP if she sits a long time.    Update 05/23/2019: She has Neuro-Behcet's syndrome and has done well on Cimzia.   (sees Dr. Lance Bosch).    She has had some oral ulcers.   She denies issues with gait, strength and sensation currently.   Vision is doing well.    Her back pain has done well  She is sleeping much better and now going to bed at 10-11 pm and waking up around 5-6 am.    She is working doing an Geographical information systems officer site (crochets).      She has some anxiety but has had less anxiety the last couple years.   She has scoliosis.  Her back pain is doing better.  Update 05/21/2018: She remains on Cimzia for her Neuro-Behcets (followed by Dr. Lenna Gilford - Rheum).  She feels she is stable.    She has some numbness and tingling since she twisted her right arm a few months ago.    Gait is doing well.    No ataxia or weakness.    No recent oral or genital lesions.     Joint pain is mild - some ankle pain, worse in  cold weather.   As a child, she had right optic neuritis and has some color desaturation OD.    She has delayed phase sleep syndrome falling asleep around 4 am and waking up around 1030-11 am.   She recently graduated a IT consultant.      She is noticing more pain mid and upper back.  She has been diagnosed with scoliosis in the past.  She wants orthopedics for this when she was a teenager.   From 11/08/2017: She was diagnosed with Neuro-Behcet's around age 39.    She actually had mouth ulcers, dizziness and vomiting episodes multiple times as a younger child and had optic neuritis at age 22.   She had an MRI after the ON and was originally diagnosed with MS and placed on MS medications.   After she also had genital ulcers and more oral ulcers a biopsy was performed consistent with vasculitis.    Since a relapse in 2011, she has had speech slurring.     She saw Dr. Normajean Baxter (Rheum) and Dr. Ebony Cargo (Neuro) in [place] around age 8 and was diagnosed with Neuro-Behcet's.    She had been on Humira (broke through with  2011 relapse) and Remicade (had possible allergic reaction) in the past.    She is on Cimzia (anti-TNF) and is doing well for the most part x several years.   She gets some mouth ulcers but no recent genital lesions.  She has some joint pain in the wrists and knees.     She has sleep onset insomnia worse than sleep maintenance.   She has a delayed phase sleep  She has depression and anxiety (especially social).   She is on prn ativan.    She had been on Paxil but it did not help.    She prefers not to be on a daily medication for mood..    I personally reviewed the MRIs of the brain dated 02/12/2010, 03/28/2016 and 10/26/2017.  The 2011 MRI showed an enhancing focus near the fourth ventricle in the left cerebellar hemisphere.  Additionally there were nonspecific white matter foci in the hemispheres and cerebellum.  The 2017 and 2019 MRIs showed a stable pattern of nonspecific foci in the  cerebral and cerebellar white matter.  The cerebellar lesion was smaller and no longer enhanced on those 2 more recent  MRIs.   Review of labs is unremarkable   REVIEW OF SYSTEMS: Constitutional: No fevers, chills, sweats, or change in appetite.  Has insomnia Eyes: No visual changes, double vision, eye pain Ear, nose and throat: No hearing loss, ear pain, nasal congestion, sore throat Cardiovascular: No chest pain, palpitations Respiratory: No shortness of breath at rest or with exertion.   No wheezes GastrointestinaI: No nausea, vomiting, diarrhea, abdominal pain, fecal incontinence Genitourinary: No dysuria, urinary retention or frequency.  No nocturia. Musculoskeletal: No neck pain, back pain Integumentary: No rash, pruritus, skin lesions Neurological: as above Psychiatric: No depression at this time.  No anxiety Endocrine: No palpitations, diaphoresis, change in appetite, change in weigh or increased thirst Hematologic/Lymphatic: No anemia, purpura, petechiae. Allergic/Immunologic: No itchy/runny eyes, nasal congestion, recent allergic reactions, rashes  ALLERGIES: Allergies  Allergen Reactions  . Colchicine Shortness Of Breath  . Milk-Related Compounds Other (See Comments)    GI UPSET  . Sulfa Antibiotics Hives and Swelling    SWELLING LIPS  . Adhesive [Tape] Other (See Comments)    SKIN IRRITATION  . Soap Itching    HOME MEDICATIONS:   PAST MEDICAL HISTORY: Past Medical History:  Diagnosis Date  . Anxiety   . Behcet's disease ([place])    hx. brain lesions, mouth and skin ulcers  . Depression   . History of MRSA infection 2015   arms, legs  . History of seizures as a child    due to Behcet's disease - no seizures since age 24  . Laceration of wrist with tendon involvement 16/02/9603   self-inflicted  . Social anxiety disorder     PAST SURGICAL HISTORY: Past Surgical History:  Procedure Laterality Date  . ARTERY AND TENDON REPAIR Left 03/04/2014    Procedure: ARTERY AND TENDON REPAIR;  Surgeon: Leanora Cover, MD;  Location: [facility];  Service: Orthopedics;  Laterality: Left;  . LUMBAR PUNCTURE  04/28/2003   under anesthesia  . WOUND EXPLORATION Left 03/04/2014   Procedure: LEFT WRIST EXPLORATION;  Surgeon: Leanora Cover, MD;  Location: [facility];  Service: Orthopedics;  Laterality: Left;    FAMILY HISTORY: Family History  Problem Relation Age of Onset  . Depression Mother   . Graves' disease Mother   . ADD / ADHD Brother   . OCD Brother   . Bipolar  disorder Maternal Uncle     SOCIAL HISTORY:  Social History   Socioeconomic History  . Marital status: Single    Spouse name: Not on file  . Number of children: Not on file  . Years of education: Not on file  . Highest education level: Not on file  Occupational History  . Occupation: unemployed  Tobacco Use  . Smoking status: Current Every Day Smoker    Years: 0.50    Types: E-cigarettes  . Smokeless tobacco: Never Used  . Tobacco comment: use everyday  Vaping Use  . Vaping Use: Every day  Substance and Sexual Activity  . Alcohol use: Yes    Alcohol/week: 1.0 standard drink    Types: 1 Glasses of wine per week    Comment: occasionally 1 drink every few months  . Drug use: Yes    Types: Marijuana    Comment: have not use in over 1  year  . Sexual activity: Not Currently    Partners: Female, Female  Other Topics Concern  . Not on file  Social History Narrative  . Not on file   Social Determinants of Health   Financial Resource Strain:   . Difficulty of Paying Living Expenses:   Food Insecurity:   . Worried About Charity fundraiser in the Last Year:   . Arboriculturist in the Last Year:   Transportation Needs:   . Film/video editor (Medical):   Marland Kitchen Lack of Transportation (Non-Medical):   Physical Activity:   . Days of Exercise per Week:   . Minutes of Exercise per Session:   Stress:   . Feeling of Stress :   Social  Connections:   . Frequency of Communication with Friends and Family:   . Frequency of Social Gatherings with Friends and Family:   . Attends Religious Services:   . Active Member of Clubs or Organizations:   . Attends Archivist Meetings:   Marland Kitchen Marital Status:   Intimate Partner Violence:   . Fear of Current or Ex-Partner:   . Emotionally Abused:   Marland Kitchen Physically Abused:   . Sexually Abused:      PHYSICAL EXAM  Vitals:   11/04/19 1515  BP: 112/76  Pulse: 93  Weight: 141 lb 8 oz (64.2 kg)    Body mass index is 28.58 kg/m.   General: The patient is well-developed and well-nourished and in no acute distress.  No sores noted in the oropharynx  Neck: The neck is nontender.  Skin: Extremities are without significant edema.  Musculoskeletal:  Back is nontender  Neurologic Exam  Mental status: The patient is alert and oriented x 3 at the time of the examination. The patient has apparent normal recent and remote memory, with an apparently normal attention span and concentration ability.   Speech is normal.  Cranial nerves: Extraocular movements are full.  She has a slight APD on the right.  Color vision was mildly reduced OD.  She has very minimal asymmetry of facial sensation, reduced on the right.  Facial symmetry is present.  Facial strength is normal.  Trapezius strength was normal.  The tongue is midline, and the patient has symmetric elevation of the soft palate. No obvious hearing deficits are noted.  Motor:  Muscle bulk is normal.   Tone is normal. Strength is  5 / 5 in all 4 extremities.   Sensory: Sensory testing is intact to pinprick, soft touch and vibration sensation in all 4 extremities.  Coordination: Cerebellar testing reveals good finger-nose-finger and heel-to-shin bilaterally.  Gait and station: Station is normal.   The gait is normal.  Tandem gait is slightly wide.  Romberg is negative. Reflexes: Deep tendon reflexes are symmetric and normal  bilaterally.   Plantar responses are flexor.    DIAGNOSTIC DATA (LABS, IMAGING, TESTING) - I reviewed patient records, labs, notes, testing and imaging myself where available.  Lab Results  Component Value Date   WBC 8.7 10/27/2017   HGB 11.9 (L) 10/27/2017   HCT 37.3 10/27/2017   MCV 84.8 10/27/2017   PLT 226 10/27/2017      Component Value Date/Time   NA 138 10/27/2017 0723   K 4.1 10/27/2017 0723   CL 108 10/27/2017 0723   CO2 20 (L) 10/27/2017 0723   GLUCOSE 91 10/27/2017 0723   BUN 9 10/27/2017 0723   CREATININE 0.53 10/27/2017 0723   CALCIUM 9.0 10/27/2017 0723   PROT 8.9 (H) 02/28/2014 1230   ALBUMIN 3.9 02/28/2014 1230   AST 18 02/28/2014 1230   ALT 14 02/28/2014 1230   ALKPHOS 46 02/28/2014 1230   BILITOT <0.2 (L) 02/28/2014 1230   GFRNONAA >60 10/27/2017 0723   GFRAA >60 10/27/2017 0723       ASSESSMENT AND PLAN  Behcet's disease (HCC)  Right facial numbness  Social anxiety disorder  H/O optic neuritis  1. Her Behcet's have been well controlled on Cimzia for the past several years but she has just had an exacerbation with a new lesion in the right pons.  Fortunately, her symptoms have quickly improved and she feels she is practically at baseline.  I did write her for a steroid pack that could be taken if she has a return of symptoms or new symptoms.  She should also call us if that occurs..  She will see Dr. Lenna Gilford of rheumatology soon.   2.   I spoke with Dr. Lenna Gilford and we discussed her case.  Several options were considered.  Since she has generally done well on Cimzia the frequency of dose will be increased.. 3.   Sleep is doing well now.  Continue to have a regular bedtime and try to get 7 to 8 hours of sleep. 4.   Mood is also continuing to do well..  For anxiety she can take as needed lorazepam. 5.  As she is stable, she will return to see [place] as needed.Marland Kitchen    45-minute office visit with the majority of the time spent face-to-face for history and  physical, discussion/counseling and decision-making.  Additional time with record review and documentation.   Deaundra Dupriest A. [name] Shelling, MD, [facility] 2/33/4356, 8:61 PM Certified in Neurology, Clinical Neurophysiology, Sleep Medicine, Pain Medicine and Neuroimaging  [facility] [address] [place], [place] [address] [phone]

## 2019-11-05 DIAGNOSIS — R200 Anesthesia of skin: Secondary | ICD-10-CM | POA: Insufficient documentation

## 2019-12-15 DIAGNOSIS — K610 Anal abscess: Secondary | ICD-10-CM

## 2019-12-15 HISTORY — DX: Anal abscess: K61.0

## 2020-01-10 ENCOUNTER — Emergency Department (HOSPITAL_BASED_OUTPATIENT_CLINIC_OR_DEPARTMENT_OTHER)
Admission: EM | Admit: 2020-01-10 | Discharge: 2020-01-10 | Disposition: A | Discharge status: Home / Self Care | Payer: 59 | Attending: Emergency Medicine | Admitting: Emergency Medicine

## 2020-01-10 ENCOUNTER — Other Ambulatory Visit: Payer: Self-pay

## 2020-01-10 ENCOUNTER — Encounter (HOSPITAL_BASED_OUTPATIENT_CLINIC_OR_DEPARTMENT_OTHER): Payer: Self-pay | Admitting: Emergency Medicine

## 2020-01-10 DIAGNOSIS — K6289 Other specified diseases of anus and rectum: Secondary | ICD-10-CM | POA: Diagnosis present

## 2020-01-10 DIAGNOSIS — L0231 Cutaneous abscess of buttock: Secondary | ICD-10-CM | POA: Diagnosis not present

## 2020-01-10 DIAGNOSIS — F17210 Nicotine dependence, cigarettes, uncomplicated: Secondary | ICD-10-CM | POA: Diagnosis not present

## 2020-01-10 LAB — PREGNANCY, URINE: Preg Test, Ur: NEGATIVE

## 2020-01-10 MED ORDER — AMOXICILLIN-POT CLAVULANATE 875-125 MG PO TABS: 1.0000 | ORAL_TABLET | Freq: Two times a day (BID) | ORAL | 0 refills | Status: DC

## 2020-01-10 MED ORDER — FLUCONAZOLE 150 MG PO TABS: ORAL_TABLET | ORAL | 0 refills | Status: DC

## 2020-01-10 MED ORDER — LIDOCAINE-EPINEPHRINE (PF) 2 %-1:200000 IJ SOLN: 10.0000 mL | Freq: Once | INTRAMUSCULAR | Status: AC

## 2020-01-10 MED ORDER — LIDOCAINE-EPINEPHRINE (PF) 2 %-1:200000 IJ SOLN
INTRAMUSCULAR | Status: AC
  Administered 2020-01-10: 10 mL via INTRADERMAL
  Filled 2020-01-10: qty 20

## 2020-01-10 MED ORDER — AMOXICILLIN-POT CLAVULANATE 875-125 MG PO TABS
1.0000 | ORAL_TABLET | Freq: Once | ORAL | Status: AC
  Administered 2020-01-10: 1 via ORAL
  Filled 2020-01-10: qty 1

## 2020-01-10 MED ORDER — HYDROCODONE-ACETAMINOPHEN 5-325 MG PO TABS
1.0000 | ORAL_TABLET | Freq: Once | ORAL | Status: AC
  Administered 2020-01-10: 1 via ORAL
  Filled 2020-01-10: qty 1

## 2020-01-10 NOTE — ED Triage Notes (Signed)
Sore in rectum for 3 days. Seen by PMD at [facility] family physicians. Received an a/b shot, but pain worse tonight.

## 2020-01-10 NOTE — ED Provider Notes (Signed)
[facility] Provider Note: Georgena Spurling, MD, FACEP  CSN: [id number] MRN: [id number] ARRIVAL: 01/10/20 at [place]: [place]  Rectal Pain   HISTORY OF PRESENT ILLNESS  01/10/20 1:10 AM Jacqueline Simpson is a 24 y.o. female with a tender lump near her rectum for the past 3 days.  It has not been draining.  It is painful and she rates the pain as a 7 out of 10, characterized as a soreness.  It is worse with movement or sitting.  She has had a fever with this but denies any other systemic symptoms.  She was seen by her PCP recently and received a shot of antibiotics but her symptoms of worsened in spite of this.  She has a history of Behcet's disease and is on immunosuppressive drugs.   Past Medical History:  Diagnosis Date  . Anxiety   . Behcet's disease ([place])    hx. brain lesions, mouth and skin ulcers  . Depression   . History of MRSA infection 2015   arms, legs  . History of seizures as a child    due to Behcet's disease - no seizures since age 10  . Laceration of wrist with tendon involvement 54/98/2641   self-inflicted  . Social anxiety disorder     Past Surgical History:  Procedure Laterality Date  . ARTERY AND TENDON REPAIR Left 03/04/2014   Procedure: ARTERY AND TENDON REPAIR;  Surgeon: Leanora Cover, MD;  Location: [place] [facility];  Service: Orthopedics;  Laterality: Left;  . LUMBAR PUNCTURE  04/28/2003   under anesthesia  . WOUND EXPLORATION Left 03/04/2014   Procedure: LEFT WRIST EXPLORATION;  Surgeon: Leanora Cover, MD;  Location: [facility];  Service: Orthopedics;  Laterality: Left;    Family History  Problem Relation Age of Onset  . Depression Mother   . Graves' disease Mother   . ADD / ADHD Brother   . OCD Brother   . Bipolar disorder Maternal Uncle     Social History   Tobacco Use  . Smoking status: Current Every Day Smoker    Years: 0.50    Types: E-cigarettes  . Smokeless tobacco: Never  Used  . Tobacco comment: use everyday  Vaping Use  . Vaping Use: Every day  Substance Use Topics  . Alcohol use: Yes    Alcohol/week: 1.0 standard drink    Types: 1 Glasses of wine per week    Comment: occasionally 1 drink every few months  . Drug use: Yes    Types: Marijuana    Comment: have not use in over 1  year    Prior to Admission medications   Medication Sig Start Date End Date Taking? Authorizing Provider  amoxicillin-clavulanate (AUGMENTIN) 875-125 MG tablet Take 1 tablet by mouth 2 (two) times daily. One po bid x 7 days 01/10/20   Adahlia Stembridge, MD  BIOTIN PO Take 1 tablet by mouth daily.    [provider]  Certolizumab Pegol (CIMZIA PREFILLED) 2 X 200 MG/ML KIT Inject 200 mg into the skin See admin instructions. Every 10 days Patient taking differently: Inject 200 mg into the skin every 14 (fourteen) days.  03/01/14   Patrecia Pour, NP  EPINEPHrine (EPIPEN JR) 0.15 MG/0.3ML injection Inject 0.15 mg into the muscle as needed for anaphylaxis.     [provider]  fluconazole (DIFLUCAN) 150 MG tablet Take 1 tablet as needed for vaginal yeast infection.  May repeat in 3 days  if symptoms persist. 01/10/20   Draylen Lobue, Jenny Reichmann, MD  LORazepam (ATIVAN) 1 MG tablet Take 1 tablet (1 mg total) by mouth daily as needed for anxiety. 05/23/19   Sater, Nanine Means, MD  norgestimate-ethinyl estradiol (SPRINTEC 28) 0.25-35 MG-MCG tablet Sprintec (28) 0.25 mg-35 mcg tablet  Take 1 tablet every day by oral route for 28 days.    [provider]  ranitidine (ZANTAC) 150 MG tablet ranitidine 150 mg tablet  TAKE 2 TABLETS BY MOUTH TWO TIMES A DAY  01/10/20  [provider]    Allergies Colchicine, Milk-related compounds, Sulfa antibiotics, Adhesive [tape], and Soap   REVIEW OF SYSTEMS  Negative except as noted here or in the History of Present Illness.   PHYSICAL EXAMINATION  Initial Vital Signs Blood pressure (!) 148/103, pulse (!) 103, temperature 99.2 F (37.3  C), temperature source Oral, resp. rate 20, last menstrual period 12/30/2019, SpO2 100 %.  Examination General: Well-developed, well-nourished female in no acute distress; appearance consistent with age of record HENT: normocephalic; atraumatic Eyes: Normal appearance Neck: supple Heart: regular rate and rhythm Lungs: clear to auscultation bilaterally Abdomen: soft; nondistended; nontender; bowel sounds present Extremities: No deformity; full range of motion Neurologic: Awake, alert and oriented; motor function intact in all extremities and symmetric; no facial droop Skin: Warm and dry; tender, indurated area of medial right buttock lateral to anus Psychiatric: Normal mood and affect   RESULTS  Summary of this visit's results, reviewed and interpreted by myself:   EKG Interpretation  Date/Time:    Ventricular Rate:    PR Interval:    QRS Duration:   QT Interval:    QTC Calculation:   R Axis:     Text Interpretation:        Laboratory Studies: Results for orders placed or performed during the hospital encounter of 01/10/20 (from the past 24 hour(s))  Pregnancy, urine     Status: None   Collection Time: 01/10/20  1:17 AM  Result Value Ref Range   Preg Test, Ur NEGATIVE NEGATIVE   Imaging Studies: No results found.  ED COURSE and MDM  Nursing notes, initial and subsequent vitals signs, including pulse oximetry, reviewed and interpreted by myself.  Vitals:   01/10/20 0103  BP: (!) 148/103  Pulse: (!) 103  Resp: 20  Temp: 99.2 F (37.3 C)  TempSrc: Oral  SpO2: 100%   Medications  HYDROcodone-acetaminophen (NORCO/VICODIN) 5-325 MG per tablet 1 tablet (has no administration in time range)  amoxicillin-clavulanate (AUGMENTIN) 875-125 MG per tablet 1 tablet (has no administration in time range)  lidocaine-EPINEPHrine (XYLOCAINE W/EPI) 2 %-1:200000 (PF) injection 10 mL (10 mLs Intradermal Given by Other 01/10/20 0145)   Abscess drained and packed.  Because patient is  immune suppressed we will place her on Augmentin and refer to [facility] for follow-up to ensure patient is healing appropriately.  PROCEDURES  Procedures INCISION AND DRAINAGE Performed by: Karen Chafe Lecretia Buczek Consent: Verbal consent obtained. Risks and benefits: risks, benefits and alternatives were discussed Type: abscess  Body area: Medial right buttock  Anesthesia: local infiltration  Incision was made with a scalpel.  Local anesthetic: lidocaine 2% with epinephrine  Anesthetic total: 3 ml  Complexity: complex Blunt dissection to break up loculations  Drainage: purulent  Drainage amount: Copious  Packing material: 1/4 in iodoform gauze  Patient tolerance: Patient tolerated the procedure well with no immediate complications.   ED DIAGNOSES     ICD-10-CM   1. Abscess of right buttock  L02.31  Jigar Zielke, Jenny Reichmann, MD 01/10/20 [phone]

## 2020-01-17 LAB — SUSCEPTIBILITY RESULT

## 2020-01-17 LAB — SUSCEPTIBILITY, AER + ANAEROB

## 2020-02-08 LAB — AEROBIC/ANAEROBIC CULTURE W GRAM STAIN (SURGICAL/DEEP WOUND)

## 2020-02-09 ENCOUNTER — Telehealth (HOSPITAL_BASED_OUTPATIENT_CLINIC_OR_DEPARTMENT_OTHER): Payer: Self-pay | Admitting: Emergency Medicine

## 2020-02-09 NOTE — Telephone Encounter (Signed)
Post ED Visit - Positive Culture Follow-up  Culture report reviewed by antimicrobial stewardship pharmacist: [facility] Team []  Elenor Quinones, Pharm.D. []  Heide Guile, Pharm.D., BCPS AQ-ID []  Parks Neptune, Pharm.D., BCPS []  Alycia Rossetti, Pharm.D., BCPS []  Pattison, Pharm.D., BCPS, AAHIVP []  Legrand Como, Pharm.D., BCPS, AAHIVP []  Salome Arnt, PharmD, BCPS []  Johnnette Gourd, PharmD, BCPS []  Hughes Better, PharmD, BCPS [x]  Duanne Limerick, PharmD []  Laqueta Linden, PharmD, BCPS []  Albertina Parr, PharmD  [place] Team []  Leodis Sias, PharmD []  Lindell Spar, PharmD []  Royetta Asal, PharmD []  Graylin Shiver, Rph []  Rema Fendt) Glennon Mac, PharmD []  Arlyn Dunning, PharmD []  Netta Cedars, PharmD []  Dia Sitter, PharmD []  Leone Haven, PharmD []  Gretta Arab, PharmD []  Theodis Shove, PharmD []  Peggyann Juba, PharmD []  Reuel Boom, PharmD   Positive aerobic/anaerobic culture Treated with Amoxicillin-Pot Clauvulanate, organism sensitive to the same and no further patient follow-up is required at this time.  Sandi Raveling Marleny Faller 02/09/2020, 1:38 PM

## 2020-04-23 ENCOUNTER — Ambulatory Visit: Payer: 59 | Admitting: Neurology

## 2020-07-13 ENCOUNTER — Other Ambulatory Visit: Payer: Self-pay | Admitting: Neurology

## 2020-07-13 DIAGNOSIS — F4010 Social phobia, unspecified: Secondary | ICD-10-CM

## 2020-08-24 ENCOUNTER — Other Ambulatory Visit: Payer: Self-pay

## 2020-08-24 ENCOUNTER — Encounter: Payer: Self-pay | Admitting: Neurology

## 2020-08-24 ENCOUNTER — Ambulatory Visit: Payer: 59 | Admitting: Neurology

## 2020-08-24 VITALS — BP 124/68 | HR 80 | Ht 59.0 in | Wt 137.0 lb

## 2020-08-24 DIAGNOSIS — R29810 Facial weakness: Secondary | ICD-10-CM

## 2020-08-24 DIAGNOSIS — Z8669 Personal history of other diseases of the nervous system and sense organs: Secondary | ICD-10-CM | POA: Diagnosis not present

## 2020-08-24 DIAGNOSIS — F4010 Social phobia, unspecified: Secondary | ICD-10-CM | POA: Diagnosis not present

## 2020-08-24 DIAGNOSIS — R1310 Dysphagia, unspecified: Secondary | ICD-10-CM

## 2020-08-24 DIAGNOSIS — R5383 Other fatigue: Secondary | ICD-10-CM | POA: Diagnosis not present

## 2020-08-24 DIAGNOSIS — M352 Behcet's disease: Secondary | ICD-10-CM | POA: Diagnosis not present

## 2020-08-24 MED ORDER — BUSPIRONE HCL 15 MG PO TABS: 15.0000 mg | ORAL_TABLET | Freq: Two times a day (BID) | ORAL | 5 refills | Status: DC

## 2020-08-24 NOTE — Progress Notes (Signed)
[facility]  PATIENT: Jacqueline Simpson DOB: [birth year]  REFERRING DOCTOR OR PCP:  Dr. Leonie Man (Neuro); Dr. Lenna Gilford (Rheum) SOURCE: patient, notes in EMR including recent hospitalization, Lab and MRI reports, MRI images personally reviewed..  _________________________________   HISTORICAL  CHIEF COMPLAINT:  Chief Complaint  Patient presents with  . Follow-up    Room 12 with her mother, Jacqueline Simpson. Follow up for Behcet's Disease. The appointment today was made to discuss paperwork needed to allow her to continue insurance coverage under her parent's plan. She has started the application process for social security disability but it can take at least nine months.     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  Update 08/24/20 She had a new CNS event May 2021 ago with right facial numbness.   MRI showed a new pontine lesion on the right.   Of note she had other Behcet lesions in the past in the pons, including  Bell's palsy and dysphagia.    I also discussed with Dr. Lenna Gilford,   She increased the Cimzia to every 10- days.  Due to insurance, safer a few months if the higher frequency, she went back to every 14 days.   She has had some some oral and genital abscesses and sores.   In the past, she had been on Humira, Remicade and Methotrexate  She is very fatigued.   This has been a long-term problem but worsened last year.  She has had attention deficit since childhood but could not tolerate medications.  This also worsened last year.   After the last exacerbation in 2021, her voice got softer and she continues to have some dysphagia.   The facial numbness is better    She is sleeping better and no longer takes trazodone.    She has some scoliosis and has LBP if she sits a long time.    Neuro-Behcet's History: She was diagnosed with Neuro-Behcet's around age 25.    She actually had mouth ulcers, dizziness and vomiting episodes  multiple times as a younger child and had optic neuritis at age 25.   She had an MRI after the ON and was originally diagnosed with MS and placed on MS medications.   After she also had genital ulcers and more oral ulcers a biopsy was performed consistent with vasculitis.    Since a relapse in 2011, she has had speech slurring.     She saw Dr. Normajean Baxter (Rheum) and Dr. Ebony Cargo (Neuro) in [place] around age 25 and was diagnosed with Neuro-Behcet's.    She had been on Humira (broke through with 2011 relapse) and Remicade (had possible allergic reaction) in the past.    She is on Cimzia (anti-TNF) and is doing well for the most part x several years.   In 2021 she had an exacerbation due to a right pontine lesion and had facial numbness, weakness and dysphagia and increase in gait issues  IMAGING  The 02/12/2010 MRI showed an enhancing focus near the fourth ventricle in the left cerebellar hemisphere.  Additionally there were nonspecific white matter foci in the hemispheres and cerebellum.    The 03/28/2016 and6/13/ 2019 MRIs showed a stable pattern of nonspecific foci in the cerebral and cerebellar white matter.  The cerebellar lesion was smaller and no longer enhanced on those 2 more recent  MRIs.   Review of labs is unremarkable  The 10/29/2019 MRI showed a new enhancing lesion measuring  15 mm in the right pons/middle cerebellar peduncle c/w Behcet's   REVIEW OF SYSTEMS: Constitutional: No fevers, chills, sweats, or change in appetite.  Has insomnia Eyes: No visual changes, double vision, eye pain Ear, nose and throat: No hearing loss, ear pain, nasal congestion, sore throat Cardiovascular: No chest pain, palpitations Respiratory: No shortness of breath at rest or with exertion.   No wheezes GastrointestinaI: No nausea, vomiting, diarrhea, abdominal pain, fecal incontinence Genitourinary: No dysuria, urinary retention or frequency.  No nocturia. Musculoskeletal: No neck pain, back pain Integumentary:  No rash, pruritus, skin lesions Neurological: as above Psychiatric: No depression at this time.  No anxiety Endocrine: No palpitations, diaphoresis, change in appetite, change in weigh or increased thirst Hematologic/Lymphatic: No anemia, purpura, petechiae. Allergic/Immunologic: No itchy/runny eyes, nasal congestion, recent allergic reactions, rashes  ALLERGIES: Allergies  Allergen Reactions  . Colchicine Shortness Of Breath  . Milk-Related Compounds Other (See Comments)    GI UPSET  . Sulfa Antibiotics Hives and Swelling    SWELLING LIPS  . Adhesive [Tape] Other (See Comments)    SKIN IRRITATION  . Soap Itching    HOME MEDICATIONS:   PAST MEDICAL HISTORY: Past Medical History:  Diagnosis Date  . Anxiety   . Behcet's disease ([place])    hx. brain lesions, mouth and skin ulcers  . Depression   . History of MRSA infection 2015   arms, legs  . History of seizures as a child    due to Behcet's disease - no seizures since age 25  . Laceration of wrist with tendon involvement 46/56/8127   self-inflicted  . Social anxiety disorder     PAST SURGICAL HISTORY: Past Surgical History:  Procedure Laterality Date  . ARTERY AND TENDON REPAIR Left 03/04/2014   Procedure: ARTERY AND TENDON REPAIR;  Surgeon: Leanora Cover, MD;  Location: [facility];  Service: Orthopedics;  Laterality: Left;  . LUMBAR PUNCTURE  04/28/2003   under anesthesia  . WOUND EXPLORATION Left 03/04/2014   Procedure: LEFT WRIST EXPLORATION;  Surgeon: Leanora Cover, MD;  Location: [facility];  Service: Orthopedics;  Laterality: Left;    FAMILY HISTORY: Family History  Problem Relation Age of Onset  . Depression Mother   . Graves' disease Mother   . ADD / ADHD Brother   . OCD Brother   . Bipolar disorder Maternal Uncle     SOCIAL HISTORY:  Social History   Socioeconomic History  . Marital status: Single    Spouse name: Not on file  . Number of children: Not on file  .  Years of education: Not on file  . Highest education level: Not on file  Occupational History  . Occupation: unemployed  Tobacco Use  . Smoking status: Current Every Day Smoker    Years: 0.50    Types: E-cigarettes  . Smokeless tobacco: Never Used  . Tobacco comment: use everyday  Vaping Use  . Vaping Use: Every day  Substance and Sexual Activity  . Alcohol use: Yes    Alcohol/week: 1.0 standard drink    Types: 1 Glasses of wine per week    Comment: occasionally 1 drink every few months  . Drug use: Yes    Types: Marijuana    Comment: have not use in over 1  year  . Sexual activity: Not Currently    Partners: Female, Female  Other Topics Concern  . Not on file  Social History Narrative  . Not on file   Social Determinants of  Health   Financial Resource Strain: Not on file  Food Insecurity: Not on file  Transportation Needs: Not on file  Physical Activity: Not on file  Stress: Not on file  Social Connections: Not on file  Intimate Partner Violence: Not on file     PHYSICAL EXAM  Vitals:   08/24/20 1401  BP: 124/68  Pulse: 80  Weight: 137 lb (62.1 kg)  Height: 4\' 11"  (1.499 m)    Body mass index is 27.67 kg/m.   General: The patient is well-developed and well-nourished and in no acute distress.  No sores noted in the oropharynx  Neck: The neck is nontender.  Skin: Extremities are without significant edema.  Musculoskeletal:  Back is nontender  Neurologic Exam  Mental status: The patient is alert and oriented x 3 at the time of the examination. The patient has apparent normal recent and remote memory, with an apparently normal attention span and concentration ability.   Speech is normal.  Cranial nerves: Extraocular movements are full.  She has a mild APD on the right.  Color vision was reduced OD.  Visual acuity reduced OD.  She has asymmetry of facial sensation, reduced on the right.  Reduced strength on right face..  Trapezius strength was normal.  The  tongue is midline, and the patient has symmetric elevation of the soft palate. No obvious hearing deficits are noted.  Motor:  Muscle bulk is normal.   Tone is normal. Strength is  5 / 5 in all 4 extremities.   Sensory: Sensory testing is intact to pinprick, soft touch and vibration sensation in all 4 extremities.  Coordination: Cerebellar testing reveals good finger-nose-finger and heel-to-shin bilaterally.  Gait and station: Station is normal.   The gait ismildly.  Tandem gait is wide.  Romberg is negative.  Reflexes: Deep tendon reflexes are symmetric and normal bilaterally.   Plantar responses are flexor.    DIAGNOSTIC DATA (LABS, IMAGING, TESTING) - I reviewed patient records, labs, notes, testing and imaging myself where available.  Lab Results  Component Value Date   WBC 8.7 10/27/2017   HGB 11.9 (L) 10/27/2017   HCT 37.3 10/27/2017   MCV 84.8 10/27/2017   PLT 226 10/27/2017      Component Value Date/Time   NA 138 10/27/2017 0723   K 4.1 10/27/2017 0723   CL 108 10/27/2017 0723   CO2 20 (L) 10/27/2017 0723   GLUCOSE 91 10/27/2017 0723   BUN 9 10/27/2017 0723   CREATININE 0.53 10/27/2017 0723   CALCIUM 9.0 10/27/2017 0723   PROT 8.9 (H) 02/28/2014 1230   ALBUMIN 3.9 02/28/2014 1230   AST 18 02/28/2014 1230   ALT 14 02/28/2014 1230   ALKPHOS 46 02/28/2014 1230   BILITOT <0.2 (L) 02/28/2014 1230   GFRNONAA >60 10/27/2017 0723   GFRAA >60 10/27/2017 0723       ASSESSMENT AND PLAN  Behcet's disease ([place])  Social anxiety disorder  H/O optic neuritis  Other fatigue  Dysphagia, unspecified type  Facial weakness  1. Continue Cimzia.  She will see Dr. Lenna Gilford of rheumatology soon.   2.  She is disabled due to combination of physical (balance, gait, visual) and other impairments (fatigue, attention deficit, anxiety) linked to her Neuro-Behcet's disease 3.   Sleep is doing well now.  Continue to have a regular bedtime and try to get 7 to 8 hours of sleep. 4.     For anxiety she can take prn lorazepam.  Trial of buspirone titrate to  15 mg po bid.   5.  RTC one year if stable but call sooner if new or worsening neurologic issues.   42-minute office visit with the majority of the time spent face-to-face for history and physical, discussion/counseling and decision-making.  Additional time with record review and documentation.   Tekoa Amon A. [name] Shelling, MD, [facility] 1/51/7616, 0:73 PM Certified in Neurology, Clinical Neurophysiology, Sleep Medicine, Pain Medicine and Neuroimaging  [facility] [address], [place], [place] [address] [phone]

## 2020-08-24 NOTE — Addendum Note (Signed)
Addended by: Arlice Colt A on: 08/24/2020 03:00 PM   Modules accepted: Orders

## 2020-09-08 ENCOUNTER — Telehealth: Payer: Self-pay | Admitting: Neurology

## 2020-09-08 DIAGNOSIS — H532 Diplopia: Secondary | ICD-10-CM

## 2020-09-08 DIAGNOSIS — M352 Behcet's disease: Secondary | ICD-10-CM

## 2020-09-08 MED ORDER — PREDNISONE 50 MG PO TABS: ORAL_TABLET | ORAL | 0 refills | Status: DC

## 2020-09-08 NOTE — Telephone Encounter (Signed)
Called pt back. She went to bed w/ a headache last night. Woke up w/ headache and double vision. Worried about relapse. She has not taken anything for headache. Double vision has improved some since this morning. I placed on hold and spoke w/ MD. Per Dr. [name] Shelling, he is wanting to call in prednisone 50mg  to take 12 tabs po x 3days #36, 0 refills. If no better by Thursday, she should call our office. I relayed this to pt. She verbalized understanding and appreciation.

## 2020-09-08 NOTE — Telephone Encounter (Signed)
Pt called wanting to be advised from the RN what she can do about the blurry vision and headache she woke up with. Please advise.

## 2020-09-16 ENCOUNTER — Other Ambulatory Visit: Payer: Self-pay | Admitting: Neurology

## 2020-09-23 ENCOUNTER — Ambulatory Visit: Payer: 59 | Admitting: Neurology

## 2020-10-01 ENCOUNTER — Ambulatory Visit (INDEPENDENT_AMBULATORY_CARE_PROVIDER_SITE_OTHER): Payer: 59 | Admitting: Psychology

## 2020-10-01 DIAGNOSIS — F064 Anxiety disorder due to known physiological condition: Secondary | ICD-10-CM | POA: Diagnosis not present

## 2020-10-07 ENCOUNTER — Ambulatory Visit (INDEPENDENT_AMBULATORY_CARE_PROVIDER_SITE_OTHER): Payer: 59 | Admitting: Psychology

## 2020-10-07 DIAGNOSIS — F064 Anxiety disorder due to known physiological condition: Secondary | ICD-10-CM | POA: Diagnosis not present

## 2020-10-08 ENCOUNTER — Ambulatory Visit: Payer: 59 | Admitting: Psychology

## 2020-10-20 ENCOUNTER — Ambulatory Visit (INDEPENDENT_AMBULATORY_CARE_PROVIDER_SITE_OTHER): Payer: 59 | Admitting: Psychology

## 2020-10-20 DIAGNOSIS — F064 Anxiety disorder due to known physiological condition: Secondary | ICD-10-CM | POA: Diagnosis not present

## 2020-10-20 DIAGNOSIS — Z0289 Encounter for other administrative examinations: Secondary | ICD-10-CM

## 2020-11-03 ENCOUNTER — Ambulatory Visit (INDEPENDENT_AMBULATORY_CARE_PROVIDER_SITE_OTHER): Payer: 59 | Admitting: Psychology

## 2020-11-03 DIAGNOSIS — F064 Anxiety disorder due to known physiological condition: Secondary | ICD-10-CM

## 2020-11-17 ENCOUNTER — Ambulatory Visit (INDEPENDENT_AMBULATORY_CARE_PROVIDER_SITE_OTHER): Payer: 59 | Admitting: Psychology

## 2020-11-17 DIAGNOSIS — F064 Anxiety disorder due to known physiological condition: Secondary | ICD-10-CM

## 2020-12-13 ENCOUNTER — Ambulatory Visit (INDEPENDENT_AMBULATORY_CARE_PROVIDER_SITE_OTHER): Payer: 59 | Admitting: Psychology

## 2020-12-13 DIAGNOSIS — F064 Anxiety disorder due to known physiological condition: Secondary | ICD-10-CM

## 2020-12-14 ENCOUNTER — Ambulatory Visit: Payer: 59 | Admitting: Psychology

## 2020-12-15 ENCOUNTER — Other Ambulatory Visit: Payer: Self-pay

## 2020-12-15 ENCOUNTER — Ambulatory Visit: Payer: Self-pay | Admitting: Surgery

## 2020-12-15 ENCOUNTER — Encounter (HOSPITAL_BASED_OUTPATIENT_CLINIC_OR_DEPARTMENT_OTHER): Payer: Self-pay | Admitting: Surgery

## 2020-12-15 DIAGNOSIS — K603 Anal fistula, unspecified: Secondary | ICD-10-CM

## 2020-12-15 DIAGNOSIS — M419 Scoliosis, unspecified: Secondary | ICD-10-CM

## 2020-12-15 DIAGNOSIS — K121 Other forms of stomatitis: Secondary | ICD-10-CM

## 2020-12-15 HISTORY — DX: Scoliosis, unspecified: M41.9

## 2020-12-15 HISTORY — DX: Other forms of stomatitis: K12.1

## 2020-12-15 HISTORY — DX: Anal fistula, unspecified: K60.30

## 2020-12-15 HISTORY — DX: Anal fistula: K60.3

## 2020-12-15 NOTE — H&P (Signed)
CC: Referred by Dr. Redmond Pulling for possible anal fistula  HPI: Jacqueline Simpson is a very pleasant 24yoF history of Behcet's disease (currently well controlled on Cimzia)- developed a perianal abscess back in August, 2021.  This was incised and drained.  She had followed up in our office with our PA and hopefully this closed but then over the ensuing months continue to open and drain spontaneously.  She was seen by one of my partners a few weeks ago and found to have a recurrent perianal abscess.  This was lanced and drained.  She was referred to see [place] for further evaluation.  Currently has minimal drainage.  She denies any active anal pain.  She has never had a colonoscopy  She does not report any known personal history of Crohn's disease.  She does report a family history of Crohn's disease in her maternal aunt.  PMH:  Behcet's disease (reports she has primarily neurologic manifestations including lesions in her brain when she has flares well-developed balance issues; also with history of ulcers in her mouth and perivaginal areas), perianal abscess  PSH: I&D of perianal abscesses  FHx: Denies FHx of colorectal, breast, endometrial, ovarian or cervical cancer. +FHx of Crohn's in maternal aunt  Social: Denies use of EtOH/drugs; +Vape with nicotine daily. Works primarily from [employer]ing for her [place]: A comprehensive 10 system review of systems was completed with the patient and pertinent findings as noted above.  The patient is a 25 year old female.   Allergies Jacqueline Forehand, CNA; 10/15/2020 3:33 PM) Sulfacetamide *CHEMICALS*   Allergies Reconciled    Medication History Jacqueline Forehand, CNA; 10/15/2020 3:33 PM) Fluconazole  (150MG Tablet, Oral) Active. Cimzia Prefilled  (2 X 200MG/ML Kit, Subcutaneous) Active. LORazepam  (1MG Tablet, Oral) Active. Sprintec 28  (0.25-35MG-MCG Tablet, Oral) Active. Levocetirizine Dihydrochloride  (5MG Tablet, Oral) Active. EPINEPHrine  (0.15MG/0.15ML  Device, Injection) Active. Medications Reconciled     Review of Systems Jacqueline Gave M. Satoria Dunlop MD; 10/15/2020 3:50 PM)  Note:  Constitutional: No acute distress; conversant; no deformities; wearing mask Eyes: Moist conjunctiva; no lid lag; anicteric sclerae; pupils equal and round Neck: Trachea midline; no palpable thyromegaly Lungs: Normal respiratory effort; no tactile fremitus CV: rrr; no palpable thrill; no pitting edema GI: Abdomen soft, nontender, nondistended; no palpable hepatosplenomegaly Anorectal: Right lateral perianal skin with pinpoint opening consistent with external opening to fistula. No perianal erythema or fluctuance. No pain. DRE/anoscopy deferred for her exam under anesthesia. MSK: Normal gait; no clubbing/cyanosis Psychiatric: Appropriate affect; alert and oriented 3 Lymphatic: No palpable cervical or axillary lymphadenopathy    Physical Exam Jacqueline Gave M. Jaleigh Mccroskey MD; 10/15/2020 3:51 PM) The physical exam findings are as follows: Note:  Constitutional: No acute distress; conversant; no deformities; wearing mask Eyes: Moist conjunctiva; no lid lag; anicteric sclerae; pupils equal and round Neck: Trachea midline; no palpable thyromegaly Lungs: Normal respiratory effort; no tactile fremitus CV: rrr; no palpable thrill; no pitting edema GI: Abdomen soft, nontender, nondistended; no palpable hepatosplenomegaly Anorectal: Right lateral perianal skin with pinpoint opening consistent with external opening to fistula. No perianal erythema or fluctuance. No pain. DRE/anoscopy deferred for her exam under anesthesia. MSK: Normal gait; no clubbing/cyanosis Psychiatric: Appropriate affect; alert and oriented 3 Lymphatic: No palpable cervical or axillary lymphadenopathy **A chaperone, Jacqueline Simpson, was present for this encounter    Assessment & Plan Jacqueline Gave M. Rubert Frediani MD; 10/15/2020 3:53 PM) ANAL FISTULA (K60.3) Story: Ms. Denley is a very pleasant 24yoF with hx of  Behcet's; now with perianal  abscesses which have recurred at same location and external findings consistent clinically with anal fistula Impression: -The anatomy and physiology of the anal canal was discussed at length with the patient. The pathophysiology of anal abscess and fistula was discussed at length with associated pictures and illustrations. -We discussed options going forward including further observation versus surgery. We discussed the surgery, tentatively plan being for interrogation of the fistulous tract and if a fistula is confirmed, placement of a draining seton. Following this, likely colonoscopy to evaluate for inflammatory bowel disease. -Referral to [facility] -The planned procedure, material risks (including, but not limited to, pain, bleeding, infection, scarring, need for additional procedures, recurrence of abscesses despite seton, seton disruption/need for replacement, pneumonia, heart attack, stroke, death) benefits and alternatives to surgery were discussed at length. We have discussed that the seton would be to lower her risk for recurrent perianal abscesses all additional workup is underway. The patient's questions were answered to their satisfaction, they voiced understanding and elected to proceed with surgery. Additionally, we discussed typical postoperative expectations and the recovery process. -I strongly recommended she quit using all nicotine products and discussed rationale therein including worsening of her fistulas and/or persistent/high risk for recurrence.  This patient encounter took 35 minutes today to perform the following: take history, perform exam, review outside records, interpret imaging, counsel the patient on their diagnosis and document encounter, findings & plan in the EHR  Signed by Jacqueline Roup, MD (10/15/2020 4:04 PM)

## 2020-12-15 NOTE — Progress Notes (Addendum)
Spoke w/ via phone for pre-op interview---pt Lab needs dos----     urine poct per anesthesia, surgery orders pending          Lab results------lov dr sater neurology 08-24-2020 epic (behcet's disease) brain mri6-15-2021 epic COVID test -----patient states asymptomatic no test needed Arrive at -------700 am 12-17-2020 NPO after MN NO Solid Food.  Clear liquids from MN until---600 am then npo Med rec completed Medications to take morning of surgery -----lorazepam prn Diabetic medication -----n/a Patient instructed no nail polish to be worn day of surgery Patient instructed to bring photo id and insurance card day of surgery Patient aware to have Driver (ride ) / caregiver mother elleanna cell [phone] cell will stay    for 24 hours after surgery  Patient Special Instructions ----- no vaping 24 hours before surgery Pre-Op special Istructions -----surgery orders req dr white epic ib Patient verbalized understanding of instructions that were given at this phone interview. Patient denies shortness of breath, chest pain, fever, cough at this phone interview.

## 2020-12-16 NOTE — Anesthesia Preprocedure Evaluation (Addendum)
Anesthesia Evaluation  Patient identified by MRN, date of birth, ID band Patient awake    Reviewed: Allergy & Precautions, NPO status , Patient's Chart, lab work & pertinent test results  Airway Mallampati: II  TM Distance: >3 FB Neck ROM: Full    Dental no notable dental hx. (+) Teeth Intact, Dental Advisory Given   Pulmonary neg pulmonary ROS, Current Smoker,    Pulmonary exam normal breath sounds clear to auscultation       Cardiovascular negative cardio ROS Normal cardiovascular exam Rhythm:Regular Rate:Normal     Neuro/Psych negative neurological ROS  negative psych ROS   GI/Hepatic Neg liver ROS, GERD  ,  Endo/Other  negative endocrine ROS  Renal/GU negative Renal ROS     Musculoskeletal negative musculoskeletal ROS (+)   Abdominal   Peds  Hematology negative hematology ROS (+) Hx of Bechets DZ   Anesthesia Other Findings   Reproductive/Obstetrics negative OB ROS                            Anesthesia Physical Anesthesia Plan  ASA: 2  Anesthesia Plan: General   Post-op Pain Management:    Induction: Intravenous  PONV Risk Score and Plan: 3 and Ondansetron, Treatment may vary due to age or medical condition and Midazolam  Airway Management Planned: LMA  Additional Equipment: None  Intra-op Plan:   Post-operative Plan:   Informed Consent: I have reviewed the patients History and Physical, chart, labs and discussed the procedure including the risks, benefits and alternatives for the proposed anesthesia with the patient or authorized representative who has indicated his/her understanding and acceptance.     Dental advisory given  Plan Discussed with: Anesthesiologist and CRNA  Anesthesia Plan Comments:        Anesthesia Quick Evaluation

## 2020-12-17 ENCOUNTER — Ambulatory Visit (HOSPITAL_BASED_OUTPATIENT_CLINIC_OR_DEPARTMENT_OTHER): Payer: 59 | Admitting: Anesthesiology

## 2020-12-17 ENCOUNTER — Encounter (HOSPITAL_BASED_OUTPATIENT_CLINIC_OR_DEPARTMENT_OTHER)
Admission: RE | Disposition: A | Discharge status: Home / Self Care | Payer: Self-pay | Source: Home / Self Care | Attending: Surgery

## 2020-12-17 ENCOUNTER — Encounter (HOSPITAL_BASED_OUTPATIENT_CLINIC_OR_DEPARTMENT_OTHER): Payer: Self-pay | Admitting: Surgery

## 2020-12-17 ENCOUNTER — Ambulatory Visit (HOSPITAL_BASED_OUTPATIENT_CLINIC_OR_DEPARTMENT_OTHER)
Admission: RE | Admit: 2020-12-17 | Discharge: 2020-12-17 | Disposition: A | Discharge status: Home / Self Care | Payer: 59 | Attending: Surgery | Admitting: Surgery

## 2020-12-17 DIAGNOSIS — Z8379 Family history of other diseases of the digestive system: Secondary | ICD-10-CM | POA: Diagnosis not present

## 2020-12-17 DIAGNOSIS — K603 Anal fistula: Secondary | ICD-10-CM | POA: Diagnosis present

## 2020-12-17 DIAGNOSIS — K620 Anal polyp: Secondary | ICD-10-CM | POA: Diagnosis not present

## 2020-12-17 DIAGNOSIS — Z888 Allergy status to other drugs, medicaments and biological substances status: Secondary | ICD-10-CM | POA: Insufficient documentation

## 2020-12-17 DIAGNOSIS — K610 Anal abscess: Secondary | ICD-10-CM | POA: Insufficient documentation

## 2020-12-17 DIAGNOSIS — F17290 Nicotine dependence, other tobacco product, uncomplicated: Secondary | ICD-10-CM | POA: Diagnosis not present

## 2020-12-17 DIAGNOSIS — Z882 Allergy status to sulfonamides status: Secondary | ICD-10-CM | POA: Insufficient documentation

## 2020-12-17 HISTORY — DX: Gastro-esophageal reflux disease without esophagitis: K21.9

## 2020-12-17 HISTORY — PX: RECTAL EXAM UNDER ANESTHESIA: SHX6399

## 2020-12-17 HISTORY — PX: PLACEMENT OF SETON: SHX6029

## 2020-12-17 LAB — POCT PREGNANCY, URINE: Preg Test, Ur: NEGATIVE

## 2020-12-17 SURGERY — PLACEMENT, SETON
Anesthesia: General | Site: Rectum

## 2020-12-17 MED ORDER — FLEET ENEMA 7-19 GM/118ML RE ENEM: 1.0000 | ENEMA | Freq: Once | RECTAL | Status: DC

## 2020-12-17 MED ORDER — FENTANYL CITRATE (PF) 100 MCG/2ML IJ SOLN
INTRAMUSCULAR | Status: DC | PRN
  Administered 2020-12-17: 50 ug via INTRAVENOUS
  Administered 2020-12-17: 25 ug via INTRAVENOUS

## 2020-12-17 MED ORDER — OXYCODONE HCL 5 MG PO TABS
5.0000 mg | ORAL_TABLET | Freq: Once | ORAL | Status: DC | PRN
Start: 2020-12-17 — End: 2020-12-17

## 2020-12-17 MED ORDER — ACETAMINOPHEN 500 MG PO TABS
ORAL_TABLET | ORAL | Status: AC
  Filled 2020-12-17: qty 2

## 2020-12-17 MED ORDER — ACETAMINOPHEN 500 MG PO TABS
1000.0000 mg | ORAL_TABLET | ORAL | Status: AC
Start: 2020-12-17 — End: 2020-12-17
  Administered 2020-12-17: 1000 mg via ORAL

## 2020-12-17 MED ORDER — MIDAZOLAM HCL 2 MG/2ML IJ SOLN
INTRAMUSCULAR | Status: AC
  Filled 2020-12-17: qty 2

## 2020-12-17 MED ORDER — BUPIVACAINE-EPINEPHRINE 0.25% -1:200000 IJ SOLN
INTRAMUSCULAR | Status: DC | PRN
  Administered 2020-12-17: 30 mL

## 2020-12-17 MED ORDER — BUPIVACAINE LIPOSOME 1.3 % IJ SUSP
INTRAMUSCULAR | Status: DC | PRN
  Administered 2020-12-17: 20 mL

## 2020-12-17 MED ORDER — DEXMEDETOMIDINE (PRECEDEX) IN NS 20 MCG/5ML (4 MCG/ML) IV SYRINGE
PREFILLED_SYRINGE | INTRAVENOUS | Status: AC
  Filled 2020-12-17: qty 5

## 2020-12-17 MED ORDER — AMISULPRIDE (ANTIEMETIC) 5 MG/2ML IV SOLN
10.0000 mg | Freq: Once | INTRAVENOUS | Status: DC | PRN
Start: 2020-12-17 — End: 2020-12-17

## 2020-12-17 MED ORDER — ONDANSETRON HCL 4 MG/2ML IJ SOLN: 4.0000 mg | Freq: Once | INTRAMUSCULAR | Status: DC | PRN

## 2020-12-17 MED ORDER — DIBUCAINE (PERIANAL) 1 % EX OINT
TOPICAL_OINTMENT | CUTANEOUS | Status: DC | PRN
  Administered 2020-12-17: 1 via RECTAL

## 2020-12-17 MED ORDER — MIDAZOLAM HCL 5 MG/5ML IJ SOLN
INTRAMUSCULAR | Status: DC | PRN
  Administered 2020-12-17: 2 mg via INTRAVENOUS

## 2020-12-17 MED ORDER — 0.9 % SODIUM CHLORIDE (POUR BTL) OPTIME
TOPICAL | Status: DC | PRN
  Administered 2020-12-17: 500 mL

## 2020-12-17 MED ORDER — OXYCODONE HCL 5 MG/5ML PO SOLN: 5.0000 mg | Freq: Once | ORAL | Status: DC | PRN

## 2020-12-17 MED ORDER — LIDOCAINE HCL (CARDIAC) PF 100 MG/5ML IV SOSY
PREFILLED_SYRINGE | INTRAVENOUS | Status: DC | PRN
  Administered 2020-12-17: 80 mg via INTRAVENOUS

## 2020-12-17 MED ORDER — TRAMADOL HCL 50 MG PO TABS: 50.0000 mg | ORAL_TABLET | Freq: Four times a day (QID) | ORAL | 0 refills | Status: AC | PRN

## 2020-12-17 MED ORDER — HYDROMORPHONE HCL 1 MG/ML IJ SOLN: 0.2500 mg | INTRAMUSCULAR | Status: DC | PRN

## 2020-12-17 MED ORDER — FENTANYL CITRATE (PF) 100 MCG/2ML IJ SOLN
INTRAMUSCULAR | Status: AC
  Filled 2020-12-17: qty 2

## 2020-12-17 MED ORDER — BUPIVACAINE LIPOSOME 1.3 % IJ SUSP: 20.0000 mL | Freq: Once | INTRAMUSCULAR | Status: DC

## 2020-12-17 MED ORDER — LACTATED RINGERS IV SOLN: INTRAVENOUS | Status: DC

## 2020-12-17 MED ORDER — EPHEDRINE SULFATE 50 MG/ML IJ SOLN
INTRAMUSCULAR | Status: DC | PRN
  Administered 2020-12-17 (×2): 10 mg via INTRAVENOUS

## 2020-12-17 MED ORDER — ACETAMINOPHEN 10 MG/ML IV SOLN: 1000.0000 mg | Freq: Once | INTRAVENOUS | Status: DC | PRN

## 2020-12-17 MED ORDER — LIDOCAINE HCL (PF) 2 % IJ SOLN
INTRAMUSCULAR | Status: AC
  Filled 2020-12-17: qty 5

## 2020-12-17 MED ORDER — SODIUM CHLORIDE 0.9 % IV SOLN
INTRAVENOUS | Status: AC
  Filled 2020-12-17: qty 2

## 2020-12-17 MED ORDER — DEXAMETHASONE SODIUM PHOSPHATE 4 MG/ML IJ SOLN
INTRAMUSCULAR | Status: DC | PRN
  Administered 2020-12-17: 5 mg via INTRAVENOUS

## 2020-12-17 MED ORDER — DEXMEDETOMIDINE (PRECEDEX) IN NS 20 MCG/5ML (4 MCG/ML) IV SYRINGE
PREFILLED_SYRINGE | INTRAVENOUS | Status: DC | PRN
  Administered 2020-12-17: 8 ug via INTRAVENOUS

## 2020-12-17 MED ORDER — DEXAMETHASONE SODIUM PHOSPHATE 10 MG/ML IJ SOLN
INTRAMUSCULAR | Status: AC
  Filled 2020-12-17: qty 1

## 2020-12-17 MED ORDER — ONDANSETRON HCL 4 MG/2ML IJ SOLN
INTRAMUSCULAR | Status: DC | PRN
  Administered 2020-12-17: 4 mg via INTRAVENOUS

## 2020-12-17 MED ORDER — ONDANSETRON HCL 4 MG/2ML IJ SOLN
INTRAMUSCULAR | Status: AC
  Filled 2020-12-17: qty 2

## 2020-12-17 MED ORDER — PROPOFOL 10 MG/ML IV BOLUS
INTRAVENOUS | Status: DC | PRN
Start: 2020-12-17 — End: 2020-12-17
  Administered 2020-12-17: 130 mg via INTRAVENOUS

## 2020-12-17 MED ORDER — SODIUM CHLORIDE 0.9 % IV SOLN
2.0000 g | INTRAVENOUS | Status: AC
  Administered 2020-12-17: 2 g via INTRAVENOUS
  Filled 2020-12-17: qty 2

## 2020-12-17 SURGICAL SUPPLY — 65 items
APL SKNCLS STERI-STRIP NONHPOA (GAUZE/BANDAGES/DRESSINGS)
BENZOIN TINCTURE PRP APPL 2/3 (GAUZE/BANDAGES/DRESSINGS) ×2 IMPLANT
BLADE EXTENDED COATED 6.5IN (ELECTRODE) IMPLANT
BLADE HEX COATED 2.75 (ELECTRODE) IMPLANT
BLADE SURG 10 STRL SS (BLADE) IMPLANT
BLADE SURG 15 STRL LF DISP TIS (BLADE) ×2 IMPLANT
BLADE SURG 15 STRL SS (BLADE) ×3
COVER BACK TABLE 60X90IN (DRAPES) ×3 IMPLANT
COVER MAYO STAND STRL (DRAPES) ×3 IMPLANT
DECANTER SPIKE VIAL GLASS SM (MISCELLANEOUS) ×1 IMPLANT
DRAPE HYSTEROSCOPY (MISCELLANEOUS) ×2 IMPLANT
DRAPE LAPAROTOMY 100X72 PEDS (DRAPES) ×1 IMPLANT
DRAPE SHEET LG 3/4 BI-LAMINATE (DRAPES) ×2 IMPLANT
DRAPE UTILITY XL STRL (DRAPES) ×1 IMPLANT
DRSG PAD ABDOMINAL 8X10 ST (GAUZE/BANDAGES/DRESSINGS) ×3 IMPLANT
GAUZE 4X4 16PLY ~~LOC~~+RFID DBL (SPONGE) ×3 IMPLANT
GAUZE SPONGE 4X4 12PLY STRL (GAUZE/BANDAGES/DRESSINGS) ×3 IMPLANT
GLOVE SRG 8 PF TXTR STRL LF DI (GLOVE) ×2 IMPLANT
GLOVE SURG ENC MOIS LTX SZ7.5 (GLOVE) ×3 IMPLANT
GLOVE SURG UNDER POLY LF SZ8 (GLOVE) ×3
GOWN STRL REUS W/TWL LRG LVL3 (GOWN DISPOSABLE) ×3 IMPLANT
HYDROGEN PEROXIDE 16OZ (MISCELLANEOUS) IMPLANT
IV CATH 14GX2 1/4 (CATHETERS) IMPLANT
IV CATH 18G SAFETY (IV SOLUTION) IMPLANT
KIT SIGMOIDOSCOPE (SET/KITS/TRAYS/PACK) IMPLANT
KIT TURNOVER CYSTO (KITS) ×3 IMPLANT
LEGGING LITHOTOMY PAIR STRL (DRAPES) ×3 IMPLANT
LOOP VESSEL MAXI BLUE (MISCELLANEOUS) ×2 IMPLANT
NDL HYPO 25X1 1.5 SAFETY (NEEDLE) IMPLANT
NDL SAFETY ECLIPSE 18X1.5 (NEEDLE) IMPLANT
NEEDLE HYPO 18GX1.5 SHARP (NEEDLE)
NEEDLE HYPO 22GX1.5 SAFETY (NEEDLE) ×3 IMPLANT
NEEDLE HYPO 25X1 1.5 SAFETY (NEEDLE) IMPLANT
NS IRRIG 500ML POUR BTL (IV SOLUTION) ×3 IMPLANT
PACK BASIN DAY SURGERY FS (CUSTOM PROCEDURE TRAY) ×3 IMPLANT
PANTS MESH DISP LRG (UNDERPADS AND DIAPERS) ×2 IMPLANT
PANTS MESH DISPOSABLE L (UNDERPADS AND DIAPERS) ×1
PENCIL SMOKE EVACUATOR (MISCELLANEOUS) ×3 IMPLANT
SPONGE HEMORRHOID 8X3CM (HEMOSTASIS) IMPLANT
SPONGE SURGIFOAM ABS GEL 12-7 (HEMOSTASIS) IMPLANT
SUCTION FRAZIER HANDLE 10FR (MISCELLANEOUS)
SUCTION TUBE FRAZIER 10FR DISP (MISCELLANEOUS) IMPLANT
SUT CHROMIC 2 0 SH (SUTURE) IMPLANT
SUT CHROMIC 3 0 SH 27 (SUTURE) IMPLANT
SUT MNCRL AB 4-0 PS2 18 (SUTURE) IMPLANT
SUT SILK 0 PSL NDL (SUTURE) IMPLANT
SUT SILK 0 TIES 10X30 (SUTURE) IMPLANT
SUT SILK 2 0 (SUTURE) ×3
SUT SILK 2-0 18XBRD TIE 12 (SUTURE) ×1 IMPLANT
SUT VIC AB 2-0 SH 27 (SUTURE)
SUT VIC AB 2-0 SH 27XBRD (SUTURE) IMPLANT
SUT VIC AB 3-0 SH 18 (SUTURE) IMPLANT
SUT VIC AB 3-0 SH 27 (SUTURE)
SUT VIC AB 3-0 SH 27X BRD (SUTURE) IMPLANT
SUT VIC AB 3-0 SH 27XBRD (SUTURE) IMPLANT
SUT VIC AB 4-0 P-3 18XBRD (SUTURE) IMPLANT
SUT VIC AB 4-0 P3 18 (SUTURE)
SYR 20ML LL LF (SYRINGE) IMPLANT
SYR BULB IRRIG 60ML STRL (SYRINGE) ×3 IMPLANT
SYR CONTROL 10ML LL (SYRINGE) ×3 IMPLANT
SYR TB 1ML LL NO SAFETY (SYRINGE) IMPLANT
TOWEL OR 17X26 10 PK STRL BLUE (TOWEL DISPOSABLE) ×3 IMPLANT
TRAY DSU PREP LF (CUSTOM PROCEDURE TRAY) ×3 IMPLANT
TUBE CONNECTING 12X1/4 (SUCTIONS) ×3 IMPLANT
YANKAUER SUCT BULB TIP NO VENT (SUCTIONS) ×3 IMPLANT

## 2020-12-17 NOTE — H&P (Signed)
CC: Here today for surgery for her likely anal fistula  HPI: Jacqueline Simpson is an 25 y.o. 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  This was incised and drained.  She had followed up in our office with our PA and apparently this closed but then over the ensuing months continued to open and drain spontaneously.  She was seen by one of my partners in follow-up and was found to have a recurrent perianal abscess.  This was I&D'd.  She was referred to see me for further evaluation.   She has never had a colonoscopy  She does not report any known personal history of Crohn's disease.  She does report a family history of Crohn's disease in her maternal aunt.  Her mother is with her in preop this morning. She denies any changes in her health, health history or allergies since we met in the office. States states she is nervous but ready for surgery    PMH:  Behcet's disease (reports she has primarily neurologic manifestations including lesions in her brain when she has flares well-developed balance issues; also with history of ulcers in her mouth and perivaginal areas), perianal abscess   PSH: I&D of perianal abscesses   FHx: Denies FHx of colorectal, breast, endometrial, ovarian or cervical cancer. +FHx of Crohn's in maternal aunt   Social: Denies use of EtOH/drugs; +Vape with nicotine daily. 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  Past Medical History:  Diagnosis Date   Anal fistula 12/15/2020   Anxiety    Behcet's disease ([place])    hx. brain lesions, mouth and skin ulcers   Depression    GERD (gastroesophageal reflux disease)    History of MRSA infection 2015   arms, legs   History of seizures as a child    due to Behcet's disease - no seizures since age 63    Laceration of wrist with tendon involvement 16/38/4665   self-inflicted   Mouth ulcer 12/15/2020   Scoliosis 12/15/2020   Social anxiety disorder     Past Surgical History:  Procedure Laterality Date   ARTERY AND TENDON REPAIR Left 03/04/2014   Procedure: ARTERY AND TENDON REPAIR;  Surgeon: Leanora Cover, MD;  Location: [facility];  Service: Orthopedics;  Laterality: Left;   LUMBAR PUNCTURE  04/28/2003   under anesthesia   WOUND EXPLORATION Left 03/04/2014   Procedure: LEFT WRIST EXPLORATION;  Surgeon: Leanora Cover, MD;  Location: [facility];  Service: Orthopedics;  Laterality: Left;    Family History  Problem Relation Age of Onset   Depression Mother    Graves' disease Mother    ADD / ADHD Brother    OCD Brother    Bipolar disorder Maternal Uncle     Social:  reports that she has been smoking e-cigarettes. She has never used smokeless tobacco. She reports current alcohol use of about 1.0 standard drink of alcohol per week. She reports previous drug use. Drug: Marijuana.  Allergies:  Allergies  Allergen Reactions   Colchicine Shortness Of Breath   Milk-Related Compounds Other (See Comments)    GI UPSET   Sulfa Antibiotics Hives and Swelling    SWELLING LIPS   Adhesive [Tape] Other (See Comments)    SKIN IRRITATION   Soap Itching  Medications: I have reviewed the patient's current medications.  Results for orders placed or performed during the hospital encounter of 12/17/20 (from the past 48 hour(s))  Pregnancy, urine POC     Status: None   Collection Time: 12/17/20  7:09 AM  Result Value Ref Range   Preg Test, Ur NEGATIVE NEGATIVE    Comment:        THE SENSITIVITY OF THIS METHODOLOGY IS >24 mIU/mL     No results found.  ROS - all of the below systems have been reviewed with the patient and positives are indicated with bold text General: chills, fever or night sweats Eyes: blurry vision or double vision ENT: epistaxis or sore  throat Allergy/Immunology: itchy/watery eyes or nasal congestion Hematologic/Lymphatic: bleeding problems, blood clots or swollen lymph nodes Endocrine: temperature intolerance or unexpected weight changes Breast: new or changing breast lumps or nipple discharge Resp: cough, shortness of breath, or wheezing CV: chest pain or dyspnea on exertion GI: as per HPI GU: dysuria, trouble voiding, or hematuria MSK: joint pain or joint stiffness Neuro: TIA or stroke symptoms Derm: pruritus and skin lesion changes Psych: anxiety and depression  PE Blood pressure 116/83, pulse (!) 16, temperature 98.7 F (37.1 C), temperature source Oral, resp. rate 16, height _0  (1.499 m), weight 63.7 kg, last menstrual period 12/14/2020, SpO2 99 %. Constitutional: NAD; conversant; wearing mask Eyes: Moist conjunctiva; no lid lag; anicteric Lungs: Normal respiratory effort CV: RRR; no pitting edema MSK: Normal range of motion of extremities Psychiatric: Appropriate affect; alert and oriented x3  Results for orders placed or performed during the hospital encounter of 12/17/20 (from the past 48 hour(s))  Pregnancy, urine POC     Status: None   Collection Time: 12/17/20  7:09 AM  Result Value Ref Range   Preg Test, Ur NEGATIVE NEGATIVE    Comment:        THE SENSITIVITY OF THIS METHODOLOGY IS >24 mIU/mL     No results found.   A/P: Jacqueline Simpson is an 25 y.o. female with with hx of Behcet's; now with perianal abscesses which have recurred at same location and external findings consistent clinically with anal fistula  -The anatomy and physiology of the anal canal was discussed at length with the patient. The pathophysiology of anal abscess and fistula was again reviewed today with the patient and her mother. -We have also again discussed thee surgery, tentatively plan being for interrogation of the fistulous tract and if a fistula is confirmed, placement of a draining seton if this appears to  involve a significant amount of sphincter muscle and/or findings or perianal Crohn's disease are seen. -Referral to [facility] has been arranged - pending consultation at their office -The planned procedure, material risks (including, but not limited to, pain, bleeding, infection, scarring, need for additional procedures, recurrence of abscesses despite seton, seton disruption/need for replacement, pneumonia, heart attack, stroke, death) benefits and alternatives to surgery were discussed at length. We have discussed that the seton would be to lower her risk for recurrent perianal abscesses all additional workup is underway. The patient's questions were answered to their satisfaction, they voiced understanding and elected to proceed with surgery. Additionally, we discussed typical postoperative expectations and the recovery process - including what a seton entails if this is deemed necessary today; rational for placement; potential durations largely dictated by findings today, recovery, GI consultation -I have strongly recommended she quit using all nicotine products and discussed rationale therein including worsening of her fistulas  and/or persistent/high risk for recurrence.  Nadeen Landau, MD [facility] Use [email or web address] to contact on call provider

## 2020-12-17 NOTE — Discharge Instructions (Addendum)
ANORECTAL SURGERY: POST OP INSTRUCTIONS Small right anterior perianal abscess with an associated low transsphincteric anal fistula was identified. This was drained and the tract controlled with a 'draining seton.' A small anal canal lesion was also excised. There were no other anal canal findings.  DIET: Follow a light bland diet the first 24 hours after arrival home, such as soup, liquids, crackers, etc.  Be sure to include lots of fluids daily.  Avoid fast food or heavy meals as your are more likely to get nauseated.  Eat a low fat diet the next few days after surgery.   Some bleeding with bowel movements is expected for the first couple of days but this should stop in between bowel movements  Take your usually prescribed home medications unless otherwise directed.  PAIN CONTROL: It is helpful to take an over-the-counter pain medication regularly for the first few days/weeks.  Choose from the following that works best for you: Ibuprofen (Advil, etc) Three '200mg'$  tabs every 6 hours as needed. Acetaminophen (Tylenol, etc) 500-'650mg'$  every 6 hours as needed NOTE: You may take both of these medications together - most patients find it most helpful when alternating between the two (i.e. Ibuprofen at 6am, tylenol at 9am, ibuprofen at 12pm ..Marland Kitchen) A  prescription for pain medication may have been prescribed for you at discharge.  Take your pain medication as prescribed.  If you are having problems/concerns with the prescription medicine, please call us for further advice.  Avoid getting constipated.  Between the surgery and the pain medications, it is common to experience some constipation.  Increasing fluid intake (64oz of water per day) and taking a fiber supplement (such as Metamucil, Citrucel, FiberCon) 1-2 times a day regularly will usually help prevent this problem from occurring.  Take Miralax (over the counter) 1-2x/day while taking a narcotic pain medication. If no bowel movement after 48hours, you  may additionally take a laxative like a bottle of Milk of Magnesia which can be purchased over the counter. Avoid enemas if possible as these are often painful.   Watch out for diarrhea.  If you have many loose bowel movements, simplify your diet to bland foods.  Stop any stool softeners and decrease your fiber supplement. If this worsens or does not improve, please call us.  Wash / shower every day.  If you were discharged with a dressing, you may remove this the day after your surgery. You may shower normally, getting soap/water on your wound, particularly after bowel movements.  Soaking in a warm bath filled a couple inches ("Sitz bath") is a great way to clean the area after a bowel movement and many patients find it is a way to soothe the area.  ACTIVITIES as tolerated:   You may resume regular (light) daily activities beginning the next day--such as daily self-care, walking, climbing stairs--gradually increasing activities as tolerated.  If you can walk 30 minutes without difficulty, it is safe to try more intense activity such as jogging, treadmill, bicycling, low-impact aerobics, etc. Refrain from any heavy lifting or straining for the first 2 weeks after your procedure, particularly if your surgery was for hemorrhoids. Avoid activities that make your pain worse You may drive when you are no longer taking prescription pain medication, you can comfortably wear a seatbelt, and you can safely maneuver your car and apply brakes.  FOLLOW UP in our office Please call [facility] at [phone] to set up an appointment to see your surgeon in the office for a follow-up appointment  approximately 2 weeks after your surgery. Make sure that you call for this appointment the day you arrive home to insure a convenient appointment time.  9. If you have disability or family leave forms that need to be completed, you may have them completed by your primary care physician's office; for return to work  instructions, please ask our office staff and they will be happy to assist you in obtaining this documentation   When to call us [phone]: Poor pain control Reactions / problems with new medications (rash/itching, etc)  Fever over 101.5 F (38.5 C) Inability to urinate Nausea/vomiting Worsening swelling or bruising Continued bleeding from incision. Increased pain, redness, or drainage from the incision  The clinic staff is available to answer your questions during regular business hours (8:30am-5pm).  Please don't hesitate to call and ask to speak to one of our nurses for clinical concerns.   A surgeon from [facility] is always on call at the hospitals   If you have a medical emergency, go to the nearest emergency room or call 911.   [facility], [address], [place], [place], [place]  [address] ? MAIN: [phone]  Information for Discharge Teaching: EXPAREL (bupivacaine liposome injectable suspension)   Your surgeon or anesthesiologist gave you EXPAREL(bupivacaine) to help control your pain after surgery.  EXPAREL is a local anesthetic that provides pain relief by numbing the tissue around the surgical site. EXPAREL is designed to release pain medication over time and can control pain for up to 72 hours. Depending on how you respond to EXPAREL, you may require less pain medication during your recovery.  Possible side effects: Temporary loss of sensation or ability to move in the area where bupivacaine was injected. Nausea, vomiting, constipation Rarely, numbness and tingling in your mouth or lips, lightheadedness, or anxiety may occur. Call your doctor right away if you think you may be experiencing any of these sensations, or if you have other questions regarding possible side effects.  Follow all other discharge instructions given to you by your surgeon or nurse. Eat a healthy diet and drink plenty of water or other fluids.  If  you return to the hospital for any reason within 96 hours following the administration of EXPAREL, it is important for health care providers to know that you have received this anesthetic. A teal colored band has been placed on your arm with the date, time and amount of EXPAREL you have received in order to alert and inform your health care providers. Please leave this armband in place for the full 96 hours following administration, and then you may remove the band.  Post Anesthesia Home Care Instructions  Activity: Get plenty of rest for the remainder of the day. A responsible individual must stay with you for 24 hours following the procedure.  For the next 24 hours, DO NOT: -Drive a car -Paediatric nurse -Drink alcoholic beverages -Take any medication unless instructed by your physician -Make any legal decisions or sign important papers.  Meals: Start with liquid foods such as gelatin or soup. Progress to regular foods as tolerated. Avoid greasy, spicy, heavy foods. If nausea and/or vomiting occur, drink only clear liquids until the nausea and/or vomiting subsides. Call your physician if vomiting continues.  Special Instructions/Symptoms: Your throat may feel dry or sore from the anesthesia or the breathing tube placed in your throat during surgery. If this causes discomfort, gargle with warm salt water. The discomfort should disappear within 24  hours.  If you had a scopolamine patch placed behind your ear for the management of post- operative nausea and/or vomiting:  1. The medication in the patch is effective for 72 hours, after which it should be removed.  Wrap patch in a tissue and discard in the trash. Wash hands thoroughly with soap and water. 2. You may remove the patch earlier than 72 hours if you experience unpleasant side effects which may include dry mouth, dizziness or visual disturbances. 3. Avoid touching the patch. Wash your hands with soap and water after contact with the  patch.     FAX [phone] [email or web address]

## 2020-12-17 NOTE — Transfer of Care (Signed)
Immediate Anesthesia Transfer of Care Note  Patient: Jacqueline Simpson  Procedure(s) Performed: PLACEMENT OF PERIANAL SETON (Rectum) ANORECTAL EXAM UNDER ANESTHESIA (Rectum)  Patient Location: PACU  Anesthesia Type:General  Level of Consciousness: awake, alert  and oriented  Airway & Oxygen Therapy: Patient Spontanous Breathing and Patient connected to nasal cannula oxygen  Post-op Assessment: Report given to RN and Post -op Vital signs reviewed and stable  Post vital signs: Reviewed and stable  Last Vitals:  Vitals Value Taken Time  BP 117/76 12/17/20 0954  Temp    Pulse 103 12/17/20 0956  Resp    SpO2 100 % 12/17/20 0956  Vitals shown include unvalidated device data.  Last Pain:  Vitals:   12/17/20 0738  TempSrc: Oral  PainSc: 0-No pain      Patients Stated Pain Goal: 4 ([id number])  Complications: No notable events documented.

## 2020-12-17 NOTE — Anesthesia Postprocedure Evaluation (Signed)
Anesthesia Post Note  Patient: Jacqueline Simpson  Procedure(s) Performed: PLACEMENT OF PERIANAL SETON (Rectum) ANORECTAL EXAM UNDER ANESTHESIA (Rectum)     Patient location during evaluation: PACU Anesthesia Type: General Level of consciousness: awake and alert Pain management: pain level controlled Vital Signs Assessment: post-procedure vital signs reviewed and stable Respiratory status: spontaneous breathing, nonlabored ventilation, respiratory function stable and patient connected to nasal cannula oxygen Cardiovascular status: blood pressure returned to baseline and stable Postop Assessment: no apparent nausea or vomiting Anesthetic complications: no   No notable events documented.  Last Vitals:  Vitals:   12/17/20 1030 12/17/20 1045  BP: 104/69 105/71  Pulse: 80 86  Resp: 14 17  Temp:    SpO2: 97% 97%    Last Pain:  Vitals:   12/17/20 1045  TempSrc:   PainSc: 0-No pain                 Barnet Glasgow

## 2020-12-17 NOTE — Op Note (Addendum)
12/17/2020  10:32 AM  PATIENT:  Jacqueline Simpson  25 y.o. female  Patient Care Team: [facility] as PCP - General (Family Medicine)  PRE-OPERATIVE DIAGNOSIS:  History of anal abscess with probable anal fistula  POST-OPERATIVE DIAGNOSIS:   Perianal abscess, right anterior Low transsphincteric anal fistula  PROCEDURE:   Incision and drainage of perianal abscess Placement of draining seton Excision of perianal lesion, anterior midline (will also serve as an anal canal tissue biopsy) Anorectal exam under anesthesia  SURGEON:  Surgeon(s): Ileana Roup, MD  ASSISTANT: OR staff   ANESTHESIA:   local and general  SPECIMEN:  Pathology  DISPOSITION OF SPECIMEN:  PATHOLOGY  COUNTS:  Sponge, needle, and instrument counts were reported correct x2 at conclusion.  EBL: 1 mL  PLAN OF CARE: Discharge to home after PACU  PATIENT DISPOSITION:  PACU - hemodynamically stable.  OR FINDINGS: Chronic appearing wound/skin in the right anterior anal margin.  This was able to be opened and approximately 1 cc of purulent fluid spontaneously then drained.  There is a palpable cord extending from this collection to the anterior midline.  Without any difficulty, this tract was able to be probed and findings are consistent with a low Tran sphincteric anal fistula.  Internal opening is just distal to the dentate line in the anterior midline position.  External opening right anterior.  A blue vessel loop draining seton was placed. Ultimately would be a reasonable candidate for a LIFT procedure.  DESCRIPTION: The patient was identified in the preoperative holding area and taken to the OR where she was placed on the operating room table. SCDs were placed.  General endotracheal anesthesia was induced without difficulty. She was then positioned in high lithotomy with Allen stirrups. Pressure points were then evaluated and padded.  She was then prepped and draped in usual  sterile fashion.  A surgical timeout was performed indicating the correct patient, procedure, and positioning.  A perianal block was performed using a dilute mixture of 0.25% Marcaine with epinephrine and Exparel.   After ascertaining that an appropriate level of anesthesia had been achieved, a well lubricated digital rectal exam was performed. This demonstrated a palpable cord extending from the right anterior anal margin towards the anterior midline.  A Hill-Ferguson anoscope was into the anal canal and circumferential inspection demonstrated healthy appearing anoderm.  There is no active fissure.  There is no ulceration or significant granulation tissue.  There is no stricture or stenosis.  There is a likely hypertrophic anal papilla in the anterior midline.  A small fluctuant nodule was noted in the right anterior perianal skin within the anal margin.  This is incised where thinning of the skin is noted.  Purulent fluid spontaneously drained, approximately 1 cc.  This tract was then carefully probed taking care not to create any false passages.  Without any difficulty, the internal opening was identified which resides just distal to the dentate line and just distal to this presumed hypertrophic anal papilla.  The sphincter complex is palpated and this involves portions of both the external and internal sphincter muscle.  This is consistent with a low Tran sphincteric anal fistula.  Given the purulent findings within the fistulous tract and potential further work-up with regards to a colonoscopy, we at this point opted to pursue placement of a draining seton.  The fistula probe was exchanged for a blue vessel loop draining seton.  This was secured to itself using 2-0 silk ties.  The  hypertrophic papilla was excised with cautery and passed off as specimen. This will also potentially serve as a tissue sample for anal tissue biopsy.  The anal canal was circumferentially inspected and hemostasis appreciated.   Sponge, needle, and instrument counts are reported correct x2.  Additional anesthetic was infiltrated around the fistula.  Topical Dibucaine ointment is applied.  A dressing consisting of 4 x 4's, ABD, mesh underwear was ultimately placed.  She was taken out of lithotomy position, awakened anesthesia, extubated, and transferred to a stretcher for transport to PACU in satisfactory condition.  DISPOSITION: PACU in satisfactory condition.

## 2020-12-17 NOTE — Anesthesia Procedure Notes (Signed)
Procedure Name: LMA Insertion Date/Time: 12/17/2020 9:10 AM Performed by: Bufford Spikes, CRNA Pre-anesthesia Checklist: Patient identified, Emergency Drugs available, Suction available and Patient being monitored Patient Re-evaluated:Patient Re-evaluated prior to induction Oxygen Delivery Method: Circle system utilized Preoxygenation: Pre-oxygenation with 100% oxygen Induction Type: IV induction Ventilation: Mask ventilation without difficulty LMA: LMA inserted LMA Size: 4.0 Number of attempts: 1 Airway Equipment and Method: Bite block Placement Confirmation: positive ETCO2 Tube secured with: Tape Dental Injury: Teeth and Oropharynx as per pre-operative assessment

## 2020-12-18 ENCOUNTER — Encounter (HOSPITAL_BASED_OUTPATIENT_CLINIC_OR_DEPARTMENT_OTHER): Payer: Self-pay | Admitting: Surgery

## 2020-12-18 LAB — SURGICAL PATHOLOGY

## 2021-01-10 ENCOUNTER — Ambulatory Visit (INDEPENDENT_AMBULATORY_CARE_PROVIDER_SITE_OTHER): Payer: 59 | Admitting: Psychology

## 2021-01-10 DIAGNOSIS — F064 Anxiety disorder due to known physiological condition: Secondary | ICD-10-CM

## 2021-01-14 ENCOUNTER — Encounter: Payer: Self-pay | Admitting: Internal Medicine

## 2021-01-29 ENCOUNTER — Other Ambulatory Visit: Payer: Self-pay | Admitting: Neurology

## 2021-01-29 DIAGNOSIS — H532 Diplopia: Secondary | ICD-10-CM

## 2021-01-29 DIAGNOSIS — M352 Behcet's disease: Secondary | ICD-10-CM

## 2021-01-29 DIAGNOSIS — R5383 Other fatigue: Secondary | ICD-10-CM

## 2021-01-29 DIAGNOSIS — R29810 Facial weakness: Secondary | ICD-10-CM

## 2021-01-29 DIAGNOSIS — R200 Anesthesia of skin: Secondary | ICD-10-CM

## 2021-02-05 ENCOUNTER — Other Ambulatory Visit (INDEPENDENT_AMBULATORY_CARE_PROVIDER_SITE_OTHER): Payer: 59

## 2021-02-05 ENCOUNTER — Encounter: Payer: Self-pay | Admitting: Internal Medicine

## 2021-02-05 ENCOUNTER — Ambulatory Visit (INDEPENDENT_AMBULATORY_CARE_PROVIDER_SITE_OTHER): Payer: 59 | Admitting: Internal Medicine

## 2021-02-05 VITALS — BP 110/80 | HR 96 | Ht 59.0 in | Wt 143.1 lb

## 2021-02-05 DIAGNOSIS — R1084 Generalized abdominal pain: Secondary | ICD-10-CM

## 2021-02-05 DIAGNOSIS — K603 Anal fistula: Secondary | ICD-10-CM

## 2021-02-05 DIAGNOSIS — K219 Gastro-esophageal reflux disease without esophagitis: Secondary | ICD-10-CM

## 2021-02-05 DIAGNOSIS — R110 Nausea: Secondary | ICD-10-CM

## 2021-02-05 LAB — TSH: TSH: 1.39 u[IU]/mL (ref 0.35–5.50)

## 2021-02-05 LAB — C-REACTIVE PROTEIN: CRP: <1 mg/dL (ref 0.5–20.0)

## 2021-02-05 LAB — CORTISOL: Cortisol, Plasma: 4.5 ug/dL

## 2021-02-05 MED ORDER — NA SULFATE-K SULFATE-MG SULF 17.5-3.13-1.6 GM/177ML PO SOLN: 1.0000 | ORAL | 0 refills | Status: DC

## 2021-02-05 MED ORDER — FAMOTIDINE 20 MG PO TABS: 20.0000 mg | ORAL_TABLET | Freq: Two times a day (BID) | ORAL | 3 refills | Status: DC | PRN

## 2021-02-05 NOTE — Patient Instructions (Addendum)
If you are age 26 or younger, your body mass index should be between 19-25. Your Body mass index is 28.91 kg/m. If this is out of the aformentioned range listed, please consider follow up with your Primary Care Provider.   __________________________________________________________  The [facility] providers would like to encourage you to use [facility] to communicate with providers for non-urgent requests or questions.  Due to long hold times on the telephone, sending your provider a message by [facility] may be a faster and more efficient way to get a response.  Please allow 48 business hours for a response.  Please remember that this is for non-urgent requests.   Your provider has requested that you go to the basement level for lab work before leaving today. Press "B" on the elevator. The lab is located at the first door on the left as you exit the elevator.   You have been scheduled for a colonoscopy and endoscopy. Please follow written instructions given to you at your visit today.  Please pick up your prep supplies at the pharmacy within the next 1-3 days. If you use inhalers (even only as needed), please bring them with you on the day of your procedure.  Due to recent changes in healthcare laws, you may see the results of your imaging and laboratory studies on MyChart before your provider has had a chance to review them.  We understand that in some cases there may be results that are confusing or concerning to you. Not all laboratory results come back in the same time frame and the provider may be waiting for multiple results in order to interpret others.  Please give [place] 48 hours in order for your provider to thoroughly review all the results before contacting the office for clarification of your results.   We have sent the following medications to your pharmacy for you to pick up at your convenience:  START: Pepcid 20mg  one tablet twice daily as needed  Thank you for entrusting me with your care  and choosing [facility].  Dr Lorenso Courier

## 2021-02-05 NOTE — Progress Notes (Signed)
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  She presents today with her mother. She developed a perianal abscess in 12/2019, which was incised and drained, this grew out pan-susceptible E coli. This abscess continued to drain spontaneously. She then was found to have a recurrent perianal abscess, which was lanced and drained again. Was seen with [facility] on 10/15/20 when she was found to have a perianal fistula. Then went to the OR on 12/17/20 for I&D of the perianal abscess and placement of draining seton. She had excision of a perianal lesion at that time that came back with pathology that was unremarkable. Denies a prior colonoscopy. Has fam hx of Crohn's disease in her 2nd cousin, colon cancer in uncle, and celiac gene in her mother (though she does not follow a gluten free diet).   She does describe issues with lower abdominal pain and diarrhea that has been going on for years. Sometimes will also get N&V and GERD (chest burning, regurgitation). Symptoms have gotten worse over the last year. The diarrhea will sometimes last all day, especially when her ab pain flares up. On average she has about 1-2 BM per day, though will sometimes have episodes where she has more frequent stools. Endorses bloating. She will have GI upset with coffee and milk. Endorse some issues with milk allergies as a child. Has infrequent episodes of small amounts of rectal bleeding. Denies melena. Will sometimes take Tums. Occasional use of ibuprofen.   Her Behcet's disease manifests with neurologic symptoms where she has balance issues and ulcers in her mouth/perivaginal area. She follows with [facility] for treatment. Has been on steroids in the past. She has been on Remicade infusions in the past and then had an  allergic reaction to a sulfa drug at the same time as her infusion so she was switched. She was previously on Humira and MTX (possibly not fully effective in controlling her symptoms) in the past. Endorses that she still has some issues with mouth ulcers and pain in her joints. She does have some issues with coughing when she drinks liquids when her neurologic symptoms of her Behcet's flare up.  Wt Readings from Last 3 Encounters:  02/05/21 143 lb 2 oz (64.9 kg)  12/17/20 140 lb 8 oz (63.7 kg)  08/24/20 137 lb (62.1 kg)    Past Medical History:  Diagnosis Date   Anal fistula 12/15/2020   Anxiety    Behcet's disease (HCC)    hx. brain lesions, mouth and skin ulcers   Depression    GERD (gastroesophageal reflux disease)    History of MRSA infection 2015   arms, legs   History of seizures as a child    due to Behcet's disease - no seizures since age 1   Laceration of wrist with tendon involvement 16/05/930   self-inflicted   Mouth ulcer 12/15/2020   Scoliosis 12/15/2020   Social anxiety disorder      Past Surgical History:  Procedure Laterality Date   ARTERY AND TENDON REPAIR Left 03/04/2014   Procedure: ARTERY AND TENDON REPAIR;  Surgeon: Leanora Cover, MD;  Location: [place];  Service: Orthopedics;  Laterality: Left;   LUMBAR PUNCTURE  04/28/2003   under anesthesia   PLACEMENT OF SETON N/A 12/17/2020   Procedure: PLACEMENT OF PERIANAL SETON;  Surgeon: Dema Severin,  Sharon Mt, MD;  Location: [facility];  Service: General;  Laterality: N/A;   RECTAL EXAM UNDER ANESTHESIA N/A 12/17/2020   Procedure: ANORECTAL EXAM UNDER ANESTHESIA;  Surgeon: Ileana Roup, MD;  Location: [place];  Service: General;  Laterality: N/A;   WISDOM TOOTH EXTRACTION     WOUND EXPLORATION Left 03/04/2014   Procedure: LEFT WRIST EXPLORATION;  Surgeon: Leanora Cover, MD;  Location: [place];  Service: Orthopedics;  Laterality: Left;    Family History  Problem Relation Age of Onset   Depression Mother    Berenice Primas' disease Mother    Irritable bowel syndrome Mother    ADD / ADHD Brother    OCD Brother    Bipolar disorder Maternal Uncle    Colon cancer Maternal Uncle 20   Crohn's disease Cousin    Lupus Cousin    Social History   Tobacco Use   Smoking status: Every Day    Types: E-cigarettes   Smokeless tobacco: Never   Tobacco comments:    use everyday  Vaping Use   Vaping Use: Every day   Substances: Nicotine, Flavoring  Substance Use Topics   Alcohol use: Yes    Alcohol/week: 1.0 standard drink    Types: 1 Glasses of wine per week    Comment: occasionally 1 drink every few months   Drug use: Not Currently    Types: Marijuana    Comment: have not use in over 4 years   Current Outpatient Medications  Medication Sig Dispense Refill   Certolizumab Pegol (CIMZIA PREFILLED) 2 X 200 MG/ML KIT Inject 200 mg into the skin See admin instructions. Every 10 days (Patient taking differently: Inject 200 mg into the skin every 14 (fourteen) days.) 1 kit    ibuprofen (ADVIL) 200 MG tablet Take 400 mg by mouth every 6 (six) hours as needed.     LORazepam (ATIVAN) 1 MG tablet Take 1 tablet (1 mg total) by mouth daily as needed for anxiety. 30 tablet 2   EPINEPHrine (EPIPEN JR) 0.15 MG/0.3ML injection Inject 0.15 mg into the muscle as needed for anaphylaxis. Does not have one (Patient not taking: Reported on 02/05/2021)     No current facility-administered medications for this visit.   Allergies  Allergen Reactions   Colchicine Shortness Of Breath   Milk-Related Compounds Other (See Comments)    GI UPSET   Sulfa Antibiotics Hives and Swelling    SWELLING LIPS   Adhesive [Tape] Other (See Comments)    SKIN IRRITATION   Soap Itching     Review of Systems: All systems reviewed and negative except where noted in HPI.   Physical Exam: BP 110/80 (BP Location: Left Arm, Patient Position: Sitting, Cuff Size: Normal)    Pulse 96   Ht 4' 11"  (1.499 m) Comment: height measured without shoes  Wt 143 lb 2 oz (64.9 kg)   BMI 28.91 kg/m  Constitutional: Pleasant,well-developed, female in no acute distress. HEENT: Normocephalic and atraumatic. Conjunctivae are normal. No scleral icterus. Cardiovascular: Normal rate, regular rhythm.  Pulmonary/chest: Effort normal and breath sounds normal. No wheezing, rales or rhonchi. Abdominal: Soft, nondistended, scattered areas where it is tender. Bowel sounds active throughout. There are no masses palpable. No hepatomegaly. Extremities: no edema Neurological: Alert and oriented Skin: Skin is warm and dry. No rashes noted. Psychiatric: Normal mood and affect. Behavior is normal.  Labs 10/26/17: CBC with Hb 11.9 (L), unremarkable BMP, CRP <0.8  CT A/P w/contrast 03/05/13: Findings consistent  with an enteritis, which may be infectious or inflammatory in origin. The length of involvement of the small bowel and sparing of the terminal ileum is atypical for Crohn's disease. Given the patient's clinical history, this may represent Behcet's disease of the small bowel. GI consultation may be helpful for further evaluation.   Path from Anal Canal Lesion Resection in OR 12/17/20: A. ANAL CANAL LESION, ANTERIOR MIDLINE, EXCISION:  - Polypoid fragment of benign squamous mucosa.   ASSESSMENT AND PLAN: Perianal fistula s/p seton placement Ab pain Diarrhea Nausea and vomiting GERD Patient has had issues with perianal abscess/fistula over the last year, which is being managed by surgery (she is being considered for LIFT surgery). Perianal fistula can be a rare manifestation of Behcet's disease, but would want to rule out Crohn's disease as a consideration. We will plan for colonoscopy with biopsies throughout the colon to assess for colitis. Will also add on EGD to assess for additional potential causes of ab pain and N&V. Behcet's disease can come with manifestations of GI symptoms  including ab pain and diarrhea, but will rule out alternative causes. Patient is already on Cimzia and has been tried on several biologic medications in the past for treatment of her Behcet's disease.  - Check CRP, TSH, cortisol. TSH and cortisol were normal - Plan for EGD/colonoscopy [facility] - Start Pepcid 20 mg BID PRN for GERD. Patient preferred to start off with a PRN medication. Can escalate to PPI in the future if needed - Consider RUQ U/S in the future if above if unrevealing - Consider treatment of SIBO in the future as this can be seen in Behcet's disease patients  Christia Reading, MD

## 2021-02-15 ENCOUNTER — Other Ambulatory Visit: Payer: Self-pay | Admitting: Neurology

## 2021-02-15 DIAGNOSIS — F4010 Social phobia, unspecified: Secondary | ICD-10-CM

## 2021-02-16 NOTE — Telephone Encounter (Signed)
Patient is due for a refill on lorazepam. Patient is up to date on her appointments.  Controlled Substance Registry checked and is appropriate.

## 2021-03-08 ENCOUNTER — Other Ambulatory Visit: Payer: Self-pay

## 2021-03-08 MED ORDER — NA SULFATE-K SULFATE-MG SULF 17.5-3.13-1.6 GM/177ML PO SOLN: 1.0000 | ORAL | 0 refills | Status: DC

## 2021-03-11 ENCOUNTER — Other Ambulatory Visit: Payer: Self-pay

## 2021-03-11 ENCOUNTER — Ambulatory Visit (AMBULATORY_SURGERY_CENTER): Payer: 59 | Admitting: Internal Medicine

## 2021-03-11 ENCOUNTER — Encounter: Payer: Self-pay | Admitting: Internal Medicine

## 2021-03-11 VITALS — BP 107/74 | HR 73 | Temp 98.2°F | Resp 11 | Ht 59.0 in | Wt 143.0 lb

## 2021-03-11 DIAGNOSIS — R1084 Generalized abdominal pain: Secondary | ICD-10-CM

## 2021-03-11 DIAGNOSIS — K2950 Unspecified chronic gastritis without bleeding: Secondary | ICD-10-CM | POA: Diagnosis not present

## 2021-03-11 DIAGNOSIS — K603 Anal fistula: Secondary | ICD-10-CM | POA: Diagnosis not present

## 2021-03-11 DIAGNOSIS — M352 Behcet's disease: Secondary | ICD-10-CM | POA: Diagnosis not present

## 2021-03-11 DIAGNOSIS — K319 Disease of stomach and duodenum, unspecified: Secondary | ICD-10-CM

## 2021-03-11 DIAGNOSIS — K2970 Gastritis, unspecified, without bleeding: Secondary | ICD-10-CM

## 2021-03-11 DIAGNOSIS — K649 Unspecified hemorrhoids: Secondary | ICD-10-CM | POA: Diagnosis not present

## 2021-03-11 DIAGNOSIS — K449 Diaphragmatic hernia without obstruction or gangrene: Secondary | ICD-10-CM | POA: Diagnosis not present

## 2021-03-11 HISTORY — PX: COLONOSCOPY WITH ESOPHAGOGASTRODUODENOSCOPY (EGD): SHX5779

## 2021-03-11 MED ORDER — SODIUM CHLORIDE 0.9 % IV SOLN: 500.0000 mL | Freq: Once | INTRAVENOUS | Status: DC

## 2021-03-11 NOTE — Patient Instructions (Signed)
Handouts were given to your care partner on a hiatal hernia and hemorrhoids. You may resume your current medications today. Await biopsy results.  May take 1-3 weeks to receive pathology results. Dr. Lorenso Courier wants to see you in the office in 1 month.  The office staff will call you to make the appointment. Please call if any questions or concerns.      YOU HAD AN ENDOSCOPIC PROCEDURE TODAY AT [facility]:   Refer to the procedure report that was given to you for any specific questions about what was found during the examination.  If the procedure report does not answer your questions, please call your gastroenterologist to clarify.  If you requested that your care partner not be given the details of your procedure findings, then the procedure report has been included in a sealed envelope for you to review at your convenience later.  YOU SHOULD EXPECT: Some feelings of bloating in the abdomen. Passage of more gas than usual.  Walking can help get rid of the air that was put into your GI tract during the procedure and reduce the bloating. If you had a lower endoscopy (such as a colonoscopy or flexible sigmoidoscopy) you may notice spotting of blood in your stool or on the toilet paper. If you underwent a bowel prep for your procedure, you may not have a normal bowel movement for a few days.  Please Note:  You might notice some irritation and congestion in your nose or some drainage.  This is from the oxygen used during your procedure.  There is no need for concern and it should clear up in a day or so.  SYMPTOMS TO REPORT IMMEDIATELY:  Following lower endoscopy (colonoscopy or flexible sigmoidoscopy):  Excessive amounts of blood in the stool  Significant tenderness or worsening of abdominal pains  Swelling of the abdomen that is new, acute  Fever of 100F or higher  Following upper endoscopy (EGD)  Vomiting of blood or coffee ground material  New chest pain or pain under the  shoulder blades  Painful or persistently difficult swallowing  New shortness of breath  Fever of 100F or higher  Black, tarry-looking stools  For urgent or emergent issues, a gastroenterologist can be reached at any hour by calling [phone]. Do not use MyChart messaging for urgent concerns.    DIET:  We do recommend a small meal at first, but then you may proceed to your regular diet.  Drink plenty of fluids but you should avoid alcoholic beverages for 24 hours.  ACTIVITY:  You should plan to take it easy for the rest of today and you should NOT DRIVE or use heavy machinery until tomorrow (because of the sedation medicines used during the test).    FOLLOW UP: Our staff will call the number listed on your records 48-72 hours following your procedure to check on you and address any questions or concerns that you may have regarding the information given to you following your procedure. If we do not reach you, we will leave a message.  We will attempt to reach you two times.  During this call, we will ask if you have developed any symptoms of COVID 19. If you develop any symptoms (ie: fever, flu-like symptoms, shortness of breath, cough etc.) before then, please call [phone].  If you test positive for Covid 19 in the 2 weeks post procedure, please call and report this information to [place].    If any biopsies were taken you  will be contacted by phone or by letter within the next 1-3 weeks.  Please call us at [phone] if you have not heard about the biopsies in 3 weeks.    SIGNATURES/CONFIDENTIALITY: You and/or your care partner have signed paperwork which will be entered into your electronic medical record.  These signatures attest to the fact that that the information above on your After Visit Summary has been reviewed and is understood.  Full responsibility of the confidentiality of this discharge information lies with you and/or your care-partner.

## 2021-03-11 NOTE — Progress Notes (Signed)
No problems noted in the recovery room. maw 

## 2021-03-11 NOTE — Progress Notes (Signed)
1405 HR > 100 with esmolol 25 mg given IV, MD updated, vss  

## 2021-03-11 NOTE — Progress Notes (Signed)
1345 HR > 100 with esmolol 25 mg given IV, MD updated, vss

## 2021-03-11 NOTE — Progress Notes (Signed)
GASTROENTEROLOGY PROCEDURE H&P NOTE   Primary Care Physician: [facility] [place]    Reason for Procedure:   Abdominal pain, N&V, perianal fistula  Plan:    EGD/colonoscopy  Patient is appropriate for endoscopic procedure(s) in the ambulatory ([place]) setting.  The nature of the procedure, as well as the risks, benefits, and alternatives were carefully and thoroughly reviewed with the patient. Ample time for discussion and questions allowed. The patient understood, was satisfied, and agreed to proceed.     HPI: Jacqueline Simpson is a 25 y.o. female who presents for EGD/colonoscopy for evaluation of ab pain and perianal fistula .  Patient was most recently seen in the Gastroenterology Clinic on 02/05/21.  No interval change in medical history since that appointment. Please refer to that note for full details regarding GI history and clinical presentation.   Past Medical History:  Diagnosis Date   Anal fistula 12/15/2020   Anxiety    Behcet's disease ([place])    hx. brain lesions, mouth and skin ulcers   Depression    GERD (gastroesophageal reflux disease)    History of MRSA infection 2015   arms, legs   History of seizures as a child    due to Behcet's disease - no seizures since age 79   Laceration of wrist with tendon involvement 63/14/9702   self-inflicted   Mouth ulcer 12/15/2020   Scoliosis 12/15/2020   Social anxiety disorder     Past Surgical History:  Procedure Laterality Date   ARTERY AND TENDON REPAIR Left 03/04/2014   Procedure: ARTERY AND TENDON REPAIR;  Surgeon: Leanora Cover, MD;  Location: [place];  Service: Orthopedics;  Laterality: Left;   LUMBAR PUNCTURE  04/28/2003   under anesthesia   PLACEMENT OF SETON N/A 12/17/2020   Procedure: PLACEMENT OF PERIANAL SETON;  Surgeon: Ileana Roup, MD;  Location: [place];  Service: General;  Laterality: N/A;   RECTAL EXAM UNDER ANESTHESIA N/A 12/17/2020    Procedure: ANORECTAL EXAM UNDER ANESTHESIA;  Surgeon: Ileana Roup, MD;  Location: [place];  Service: General;  Laterality: N/A;   WISDOM TOOTH EXTRACTION     WOUND EXPLORATION Left 03/04/2014   Procedure: LEFT WRIST EXPLORATION;  Surgeon: Leanora Cover, MD;  Location: [place];  Service: Orthopedics;  Laterality: Left;    Prior to Admission medications   Medication Sig Start Date End Date Taking? Authorizing Provider  LORazepam (ATIVAN) 1 MG tablet TAKE 1 TABLET BY MOUTH DAILY AS NEEDED FOR ANXIETY 02/16/21  Yes Sater, Nanine Means, MD  Certolizumab Pegol (CIMZIA PREFILLED) 2 X 200 MG/ML KIT Inject 200 mg into the skin See admin instructions. Every 10 days Patient taking differently: Inject 200 mg into the skin every 14 (fourteen) days. 03/01/14   Patrecia Pour, NP  EPINEPHrine (EPIPEN JR) 0.15 MG/0.3ML injection Inject 0.15 mg into the muscle as needed for anaphylaxis. Does not have one Patient not taking: No sig reported    [provider]  famotidine (PEPCID) 20 MG tablet Take 1 tablet (20 mg total) by mouth 2 (two) times daily as needed for heartburn or indigestion. 02/05/21   Sharyn Creamer, MD  ibuprofen (ADVIL) 200 MG tablet Take 400 mg by mouth every 6 (six) hours as needed.    [provider]    Current Outpatient Medications  Medication Sig Dispense Refill   LORazepam (ATIVAN) 1 MG tablet TAKE 1 TABLET BY MOUTH DAILY AS NEEDED FOR  ANXIETY 30 tablet 5   Certolizumab Pegol (CIMZIA PREFILLED) 2 X 200 MG/ML KIT Inject 200 mg into the skin See admin instructions. Every 10 days (Patient taking differently: Inject 200 mg into the skin every 14 (fourteen) days.) 1 kit    EPINEPHrine (EPIPEN JR) 0.15 MG/0.3ML injection Inject 0.15 mg into the muscle as needed for anaphylaxis. Does not have one (Patient not taking: No sig reported)     famotidine (PEPCID) 20 MG tablet Take 1 tablet (20 mg total) by mouth 2 (two) times daily as needed  for heartburn or indigestion. 60 tablet 3   ibuprofen (ADVIL) 200 MG tablet Take 400 mg by mouth every 6 (six) hours as needed.     Current Facility-Administered Medications  Medication Dose Route Frequency Provider Last Rate Last Admin   0.9 %  sodium chloride infusion  500 mL Intravenous Once Sharyn Creamer, MD        Allergies as of 03/11/2021 - Review Complete 03/11/2021  Allergen Reaction Noted   Colchicine Shortness Of Breath 03/03/2014   Milk-related compounds Other (See Comments) 03/03/2014   Sulfa antibiotics Hives and Swelling 03/03/2014   Adhesive [tape] Other (See Comments) 03/03/2014   Soap Itching 03/03/2014    Family History  Problem Relation Age of Onset   Depression Mother    Graves' disease Mother    Irritable bowel syndrome Mother    ADD / ADHD Brother    OCD Brother    Bipolar disorder Maternal Uncle    Colon cancer Maternal Uncle 70   Crohn's disease Cousin    Lupus Cousin    Esophageal cancer Neg Hx    Rectal cancer Neg Hx    Stomach cancer Neg Hx     Social History   Socioeconomic History   Marital status: Single    Spouse name: Not on file   Number of children: 0   Years of education: Not on file   Highest education level: Not on file  Occupational History   Occupation: [employer]  Tobacco Use   Smoking status: Every Day    Types: E-cigarettes   Smokeless tobacco: Never   Tobacco comments:    use everyday  Vaping Use   Vaping Use: Every day   Substances: Nicotine, Flavoring  Substance and Sexual Activity   Alcohol use: Yes    Alcohol/week: 1.0 standard drink    Types: 1 Glasses of wine per week    Comment: occasionally 1 drink every few months   Drug use: Not Currently    Types: Marijuana    Comment: have not use in over 4 years   Sexual activity: Not Currently    Partners: Female, Female  Other Topics Concern   Not on file  Social History Narrative   Not on file   Social Determinants of Health   Financial Resource Strain: Not on  file  Food Insecurity: Not on file  Transportation Needs: Not on file  Physical Activity: Not on file  Stress: Not on file  Social Connections: Not on file  Intimate Partner Violence: Not on file    Physical Exam: Vital signs in last 24 hours: BP (!) 133/96   Pulse 98   Temp 98.2 F (36.8 C)   Resp 13   Ht _0  (1.499 m)   Wt 143 lb (64.9 kg)   LMP  (LMP Unknown) Comment: denies pregnancy  SpO2 100%   BMI 28.88 kg/m  GEN: NAD EYE: Sclerae anicteric ENT: MMM CV: Non-tachycardic Pulm:  No increased WOB GI: Soft NEURO:  Alert & Oriented   Christia Reading, MD [facility]   03/11/2021 1:41 PM

## 2021-03-11 NOTE — Op Note (Signed)
[facility] Patient Name: Jacqueline Simpson Procedure Date: 03/11/2021 12:53 PM MRN: [id number] Endoscopist: Sonny Masters "Christia Reading ,  Age: 25 Referring MD:  Date of Birth: 1996-02-14 Gender: Female Account #: [id number] Procedure:                Upper GI endoscopy Indications:              Abdominal pain, Nausea with vomiting Medicines:                Monitored Anesthesia Care Procedure:                After obtaining informed consent, the endoscope was                            passed under direct vision. Throughout the                            procedure, the patient's blood pressure, pulse, and                            oxygen saturations were monitored continuously. The                            GIF [id number] #[id number] was introduced through the                            mouth, and advanced to the second part of duodenum. Scope In: Scope Out: Findings:                 The examined esophagus was normal. Biopsies were                            taken with a cold forceps for histology.                           Bilious fluid was found in the gastric body.                           A medium-sized hiatal hernia was present.                           Localized erythematous mucosa without bleeding was                            found in the gastric body. Biopsies were taken with                            a cold forceps for histology.                           The examined duodenum was normal. Biopsies were                            taken with a cold forceps for histology. Complications:            No immediate complications. Estimated Blood Loss:  Estimated blood loss was minimal. Impression:               - Normal esophagus. Biopsied.                           - Bilious gastric fluid.                           - Medium-sized hiatal hernia.                           - Erythematous mucosa in the gastric body. Biopsied.                           - Normal examined duodenum.  Biopsied. Recommendation:           - Await pathology results.                           - Perform a colonoscopy today. [address]ia Reading,  03/11/2021 2:16:55 PM

## 2021-03-11 NOTE — Op Note (Signed)
[facility] Patient Name: Jacqueline Simpson Procedure Date: 03/11/2021 12:53 PM MRN: [id number] Endoscopist: Sonny Masters "Jacqueline Simpson ,  Age: 25 Referring MD:  Date of Birth: 1995-08-12 Gender: Female Account #: [id number] Procedure:                Colonoscopy Indications:              Anorectal fistula, Behcet's disease Medicines:                Monitored Anesthesia Care Procedure:                Pre-Anesthesia Assessment:                           - Prior to the procedure, a History and Physical                            was performed, and patient medications and                            allergies were reviewed. The patient's tolerance of                            previous anesthesia was also reviewed. The risks                            and benefits of the procedure and the sedation                            options and risks were discussed with the patient.                            All questions were answered, and informed consent                            was obtained. Prior Anticoagulants: The patient has                            taken no previous anticoagulant or antiplatelet                            agents. ASA Grade Assessment: II - A patient with                            mild systemic disease. After reviewing the risks                            and benefits, the patient was deemed in                            satisfactory condition to undergo the procedure.                           After obtaining informed consent, the colonoscope  was passed under direct vision. Throughout the                            procedure, the patient's blood pressure, pulse, and                            oxygen saturations were monitored continuously. The                            Olympus PCF-H190DL (#[id number]) Colonoscope was                            introduced through the anus and advanced to the the                            terminal ileum.  The colonoscopy was performed                            without difficulty. The patient tolerated the                            procedure well. The quality of the bowel                            preparation was good. Scope In: 1:53:14 PM Scope Out: 2:08:20 PM Scope Withdrawal Time: 0 hours 12 minutes 11 seconds  Total Procedure Duration: 0 hours 15 minutes 6 seconds  Findings:                 The terminal ileum appeared normal. Biopsies were                            taken with a cold forceps for histology.                           Biopsies were taken with a cold forceps in the                            rectum, in the sigmoid colon, in the descending                            colon, in the transverse colon, in the ascending                            colon and in the cecum for histology.                           Non-bleeding internal hemorrhoids were found during                            retroflexion.                           Perianal fistula with seton was found on rectal  exam. Complications:            No immediate complications. Estimated Blood Loss:     Estimated blood loss was minimal. Impression:               - The examined portion of the ileum was normal.                            Biopsied.                           - Biopsies were taken with a cold forceps for                            histology in the rectum, in the sigmoid colon, in                            the descending colon, in the transverse colon, in                            the ascending colon and in the cecum.                           - Non-bleeding internal hemorrhoids.                           - Perianal fistula with seton. Recommendation:           - Discharge patient to home (with escort).                           - No obvious signs of Crohn's disease were seen on                            today's colonoscopy. It is possible that your                             perianal fistula is due to your Behcet's disease.                           - Await pathology results.                           - Return to GI clinic in 1 month. May consider                            gastric emptying study in the future due to the                            presence of bilious fluid in the stomach.                           - The findings and recommendations were discussed  with the patient and/or patient's family. [address] DriveChristia Simpson,  03/11/2021 2:23:55 PM

## 2021-03-11 NOTE — Progress Notes (Signed)
Check-in-AER V/S-DT 

## 2021-03-11 NOTE — Progress Notes (Signed)
Called to room to assist during endoscopic procedure.  Patient ID and intended procedure confirmed with present staff. Received instructions for my participation in the procedure from the performing physician.  

## 2021-03-11 NOTE — Progress Notes (Signed)
1335 Robinul 0.1 mg IV given due large amount of secretions upon assessment.  MD made aware, vss 

## 2021-03-11 NOTE — Progress Notes (Signed)
Report given to PACU, vss 

## 2021-03-12 ENCOUNTER — Telehealth: Payer: Self-pay

## 2021-03-12 NOTE — Telephone Encounter (Signed)
Pt returned call. Scheduled as follows:  Appointment Information  Name: Makynzee, Tigges MRN: [id number]  Date: 04/13/2021 Status: Sch  Time: 1:50 PM Length: 20  Visit Type: FOLLOW UP 20 [336] Copay: $25.00  Provider: Sharyn Creamer, MD Department: [facility]  Referring Provider: [facility] @ [facility] CSN: [id number]  Notes: 1 mo f/u; discuss possible need for GES/ae  Made On: 03/12/2021 4:34 PM By: Hardie Pulley, Yashica Sterbenz J

## 2021-03-12 NOTE — Telephone Encounter (Signed)
Per procedure report, Dr. Lorenso Courier requested 1 month f/u appt, possible GES discussion d/t bilious fluid in stomach. LVM requesting returned call.

## 2021-03-16 ENCOUNTER — Telehealth: Payer: Self-pay

## 2021-03-16 NOTE — Telephone Encounter (Signed)
No answer, left message to call if having any issues or concerns, B.Fate Galanti RN 

## 2021-03-20 ENCOUNTER — Encounter: Payer: Self-pay | Admitting: Internal Medicine

## 2021-04-13 ENCOUNTER — Other Ambulatory Visit: Payer: Self-pay

## 2021-04-13 ENCOUNTER — Ambulatory Visit: Payer: Self-pay | Admitting: Surgery

## 2021-04-13 ENCOUNTER — Encounter (HOSPITAL_BASED_OUTPATIENT_CLINIC_OR_DEPARTMENT_OTHER): Payer: Self-pay | Admitting: Surgery

## 2021-04-13 ENCOUNTER — Ambulatory Visit: Payer: 59 | Admitting: Internal Medicine

## 2021-04-13 NOTE — Progress Notes (Deleted)
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 She developed a perianal abscess in 12/2019, which was incised and drained, this grew out pan-susceptible E coli. This abscess continued to drain spontaneously. She then was found to have a recurrent perianal abscess, which was lanced and drained again. Was seen with [facility] on 10/15/20 when she was found to have a perianal fistula. Then went to the OR on 12/17/20 for I&D of the perianal abscess and placement of draining seton. She had excision of a perianal lesion at that time that came back with pathology that was unremarkable. Denies a prior colonoscopy. Has fam hx of Crohn's disease in her 2nd cousin, colon cancer in uncle, and celiac gene in her mother (though she does not follow a gluten free diet).   She does describe issues with lower abdominal pain and diarrhea that has been going on for years. Sometimes will also get N&V and GERD (chest burning, regurgitation). Symptoms have gotten worse over the last year. The diarrhea will sometimes last all day, especially when her ab pain flares up. On average she has about 1-2 BM per day, though will sometimes have episodes where she has more frequent stools. Endorses bloating. She will have GI upset with coffee and milk. Endorse some issues with milk allergies as a child. Has infrequent episodes of small amounts of rectal bleeding. Denies melena. Will sometimes take Tums. Occasional use of ibuprofen.   Her Behcet's disease manifests with neurologic symptoms where she has balance issues and ulcers in her mouth/perivaginal area. She follows with [facility] for treatment. Has been on steroids in the past. She has been on Remicade infusions in the past and then had an allergic reaction to a sulfa  drug at the same time as her infusion so she was switched. She was previously on Humira and MTX (possibly not fully effective in controlling her symptoms) in the past. Endorses that she still has some issues with mouth ulcers and pain in her joints. She does have some issues with coughing when she drinks liquids when her neurologic symptoms of her Behcet's flare up.  Interval History: ***Did Pepcid help?   Past Medical History:  Diagnosis Date   Anal fistula 12/15/2020   Anxiety    Behcet's disease ([place])    hx. brain lesions, mouth and skin ulcers   Depression    GERD (gastroesophageal reflux disease)    History of MRSA infection 2015   arms, legs   History of seizures as a child    due to Behcet's disease - no seizures since age 72   Joint pain    due to Behcet's syndrome   Laceration of wrist with tendon involvement 56/31/4970   self-inflicted   Mouth ulcer 12/15/2020   Perianal abscess 12/2019   Scoliosis 12/15/2020   Social anxiety disorder      Past Surgical History:  Procedure Laterality Date   ARTERY AND TENDON REPAIR Left 03/04/2014   Procedure: ARTERY AND TENDON REPAIR;  Surgeon: Leanora Cover, MD;  Location: [facility];  Service: Orthopedics;  Laterality: Left;   COLONOSCOPY WITH ESOPHAGOGASTRODUODENOSCOPY (EGD)  03/11/2021   LUMBAR PUNCTURE  04/28/2003   under anesthesia   PLACEMENT OF SETON N/A 12/17/2020   Procedure: [place];  Surgeon: Nadeen Landau  M, MD;  Location: [facility];  Service: General;  Laterality: N/A;   RECTAL EXAM UNDER ANESTHESIA N/A 12/17/2020   Procedure: ANORECTAL EXAM UNDER ANESTHESIA;  Surgeon: Ileana Roup, MD;  Location: [facility];  Service: General;  Laterality: N/A;   WISDOM TOOTH EXTRACTION     WOUND EXPLORATION Left 03/04/2014   Procedure: LEFT WRIST EXPLORATION;  Surgeon: Leanora Cover, MD;  Location: [facility];  Service: Orthopedics;   Laterality: Left;   Family History  Problem Relation Age of Onset   Depression Mother    Berenice Primas' disease Mother    Irritable bowel syndrome Mother    ADD / ADHD Brother    OCD Brother    Bipolar disorder Maternal Uncle    Colon cancer Maternal Uncle 39   Crohn's disease Cousin    Lupus Cousin    Esophageal cancer Neg Hx    Rectal cancer Neg Hx    Stomach cancer Neg Hx    Social History   Tobacco Use   Smoking status: Every Day    Types: E-cigarettes   Smokeless tobacco: Never   Tobacco comments:    use everyday  Vaping Use   Vaping Use: Every day   Substances: Nicotine, Flavoring  Substance Use Topics   Alcohol use: Yes    Alcohol/week: 1.0 standard drink    Types: 1 Glasses of wine per week    Comment: occasionally 1 drink every few months   Drug use: Not Currently    Types: Marijuana    Comment: have not use in over 4 years   Current Outpatient Medications  Medication Sig Dispense Refill   Certolizumab Pegol (CIMZIA PREFILLED) 2 X 200 MG/ML KIT Inject 200 mg into the skin See admin instructions. Every 10 days (Patient taking differently: Inject 200 mg into the skin every 14 (fourteen) days.) 1 kit    EPINEPHrine (EPIPEN JR) 0.15 MG/0.3ML injection Inject 0.15 mg into the muscle as needed for anaphylaxis. Does not have one (Patient not taking: Reported on 02/05/2021)     famotidine (PEPCID) 20 MG tablet Take 1 tablet (20 mg total) by mouth 2 (two) times daily as needed for heartburn or indigestion. (Patient taking differently: Take 20 mg by mouth 2 (two) times daily as needed for heartburn or indigestion. Pt hasn't started taking as of 04/13/21 per pt.) 60 tablet 3   ibuprofen (ADVIL) 200 MG tablet Take 400 mg by mouth every 6 (six) hours as needed.     LORazepam (ATIVAN) 1 MG tablet TAKE 1 TABLET BY MOUTH DAILY AS NEEDED FOR ANXIETY 30 tablet 5   No current facility-administered medications for this visit.   Allergies  Allergen Reactions   Colchicine Shortness Of  Breath   Milk-Related Compounds Other (See Comments)    GI UPSET   Sulfa Antibiotics Hives and Swelling    SWELLING LIPS   Adhesive [Tape] Other (See Comments)    SKIN IRRITATION   Soap Itching    Certain scented soaps     Review of Systems: All systems reviewed and negative except where noted in HPI.   Physical Exam: LMP 03/29/2021 (Approximate)  Constitutional: Pleasant,well-developed, female in no acute distress. HEENT: Normocephalic and atraumatic. Conjunctivae are normal. No scleral icterus. Cardiovascular: Normal rate, regular rhythm.  Pulmonary/chest: Effort normal and breath sounds normal. No wheezing, rales or rhonchi. Abdominal: Soft, nondistended, scattered areas where it is tender. Bowel sounds active throughout. There are no masses palpable. No hepatomegaly. Extremities: no edema  Neurological: Alert and oriented Skin: Skin is warm and dry. No rashes noted. Psychiatric: Normal mood and affect. Behavior is normal.  Labs 10/26/17: CBC with Hb 11.9 (L), unremarkable BMP, CRP <0.8  CT A/P w/contrast 03/05/13: Findings consistent with an enteritis, which may be infectious or inflammatory in origin. The length of involvement of the small bowel and sparing of the terminal ileum is atypical for Crohn's disease. Given the patient's clinical history, this may represent Behcet's disease of the small bowel. GI consultation may be helpful for further evaluation.   Path from Anal Canal Lesion Resection in OR 12/17/20: A. ANAL CANAL LESION, ANTERIOR MIDLINE, EXCISION:  - Polypoid fragment of benign squamous mucosa.   EGD 03/11/21: - Normal esophagus. Biopsied. - Bilious gastric fluid. - Medium-sized hiatal hernia. - Erythematous mucosa in the gastric body. Biopsied. - Normal examined duodenum. Biopsied. Path: 1. Surgical [P], duodenum - BENIGN DUODENAL MUCOSA - NO ACUTE INFLAMMATION, VILLOUS BLUNTING OR INCREASED INTRAEPITHELIAL LYMPHOCYTES IDENTIFIED 2. Surgical [P],  gastric-erythema - MILD CHRONIC GASTRITIS WITHOUT ACTIVITY - NO H. PYLORI OR INTESTINAL METAPLASIA IDENTIFIED - SEE COMMENT 2. Warthin-Starry stain is NEGATIVE for organisms morphologically consistent with Helicobacter pylori. 3. Surgical [P], esophagus - BENIGN SQUAMOUS MUCOSA - NO INCREASED INTRAEPITHELIAL EOSINOPHILS  Colonoscopy 03/11/21: - The examined portion of the ileum was normal. Biopsied. - Biopsies were taken with a cold forceps for histology in the rectum, in the sigmoid colon, in the descending colon, in the transverse colon, in the ascending colon and in the cecum. - Non-bleeding internal hemorrhoids. - Perianal fistula with seton Path: 4. Surgical [P], small bowel, terminal ileum - BENIGN SMALL BOWEL MUCOSA WITH LYMPHOID AGGREGATES - NO ACUTE INFLAMMATION, GRANULOMAS OR MALIGNANCY IDENTIFIED 5. Surgical [P], right colon - BENIGN COLONIC MUCOSA - NO ACTIVE INFLAMMATION OR EVIDENCE OF MICROSCOPIC COLITIS - NO HIGH-GRADE DYSPLASIA OR MALIGNANCY IDENTIFIED 6. Surgical [P], colon, transverse - BENIGN COLONIC MUCOSA - NO ACTIVE INFLAMMATION OR EVIDENCE OF MICROSCOPIC COLITIS - NO HIGH-GRADE DYSPLASIA OR MALIGNANCY IDENTIFIED 7. Surgical [P], left colon - BENIGN COLONIC MUCOSA - NO ACTIVE INFLAMMATION OR EVIDENCE OF MICROSCOPIC COLITIS - NO HIGH-GRADE DYSPLASIA OR MALIGNANCY IDENTIFIED  ASSESSMENT AND PLAN: Perianal fistula s/p seton placement Ab pain Diarrhea Nausea and vomiting GERD Patient has had issues with perianal abscess/fistula over the last year, which is being managed by surgery (she is being considered for LIFT surgery). Perianal fistula can be a rare manifestation of Behcet's disease, but would want to rule out Crohn's disease as a consideration. We will plan for colonoscopy with biopsies throughout the colon to assess for colitis. Will also add on EGD to assess for additional potential causes of ab pain and N&V. Behcet's disease can come with  manifestations of GI symptoms including ab pain and diarrhea, but will rule out alternative causes. Patient is already on Cimzia and has been tried on several biologic medications in the past for treatment of her Behcet's disease.  - Check CRP, TSH, cortisol. TSH and cortisol were normal - Plan for EGD/colonoscopy [facility] - Start Pepcid 20 mg BID PRN for GERD. Patient preferred to start off with a PRN medication. Can escalate to PPI in the future if needed - Consider RUQ U/S in the future if above if unrevealing - Consider GES  - Consider treatment of SIBO in the future as this can be seen in Behcet's disease patients  Christia Reading, MD

## 2021-04-13 NOTE — Progress Notes (Signed)
Spoke w/ via phone for pre-op interview---pt Lab needs dos----urine POCT per anesthesia, surgeon orders pending               Lab results------[facility] neurology with Dr. Mariea Stable for Neuro-Beschet's disease on 08/24/20, 10/29/19 Brain MRI in [place] test -----patient will get Covid test on 04/15/21 due to stuffy nose and dry throat Arrive at -------1130 on 04/19/21 NPO after MN NO Solid Food.  Clear liquids from MN until---1030 Med rec completed Medications to take morning of surgery -----Lorazepam prn, Pepcid prn Diabetic medication -----n/a Patient instructed no nail polish to be worn day of surgery Patient instructed to bring photo id and insurance card day of surgery Patient aware to have Driver (ride ) / caregiver    for 24 hours after surgery - boyfriend Jacqueline Simpson Patient Special Instructions -----no vaping or alcohol 24 hours before surgery Pre-Op special Istructions -----surgery orders requested from Dr. Dema Severin via Epic IB on 04/13/21 Patient verbalized understanding of instructions that were given at this phone interview. Patient denies shortness of breath, chest pain, fever, cough at this phone interview.

## 2021-04-14 NOTE — Progress Notes (Signed)
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  She has made some dietary changes, which has significantly improved her abdominal symptoms.  She has not tried the famotidine that was recommended previously due to a dramatic improvement in her symptoms.  She currently denies abdominal pain, nausea, vomiting, diarrhea.  Her recent EGD and colonoscopy were fairly unrevealing.  She is scheduled for ligation of her perianal fistula on 12/5, and her surgeon wanted to make sure that it would be okay to proceed with that operation.  Current Outpatient Medications  Medication Sig Dispense Refill   Certolizumab Pegol (CIMZIA PREFILLED) 2 X 200 MG/ML KIT Inject 200 mg into the skin See admin instructions. Every 10 days (Patient taking differently: Inject 200 mg into the skin every 14 (fourteen) days.) 1 kit    ibuprofen (ADVIL) 200 MG tablet Take 400 mg by mouth every 6 (six) hours as needed.     LORazepam (ATIVAN) 1 MG tablet TAKE 1 TABLET BY MOUTH DAILY AS NEEDED FOR ANXIETY 30 tablet 5   EPINEPHrine (EPIPEN JR) 0.15 MG/0.3ML injection Inject 0.15 mg into the muscle as needed for anaphylaxis. Does not have one (Patient not taking: Reported on 04/16/2021)     famotidine (PEPCID) 20 MG tablet Take 1 tablet (20 mg total) by mouth 2 (two) times daily as needed for heartburn or indigestion. (Patient not taking: Reported on 04/16/2021) 60 tablet 3   No current facility-administered medications for this visit.   Review of Systems: All systems reviewed and negative except where noted in HPI.   Physical Exam: BP 118/82   Pulse (!) 107   Ht 4' 11"  (1.499 m)   Wt 150 lb (68 kg)   LMP 03/29/2021 (Approximate)   SpO2 98%   BMI 30.30 kg/m  Constitutional: Pleasant,well-developed, female in no acute  distress. HEENT: Normocephalic and atraumatic. Conjunctivae are normal. No scleral icterus. Cardiovascular: Normal rate, regular rhythm.  Pulmonary/chest: Effort normal and breath sounds normal. No wheezing, rales or rhonchi. Abdominal: Soft, nondistended, non-tender. Bowel sounds active throughout. Extremities: no edema Neurological: Alert and oriented Skin: Skin is warm and dry. No rashes noted. Psychiatric: Normal mood and affect. Behavior is normal.  Labs 10/26/17: CBC with Hb 11.9 (L), unremarkable BMP, CRP <0.8  CT A/P w/contrast 03/05/13: Findings consistent with an enteritis, which may be infectious or inflammatory in origin. The length of involvement of the small bowel and sparing of the terminal ileum is atypical for Crohn's disease. Given the patient's clinical history, this may represent Behcet's disease of the small bowel. GI consultation may be helpful for further evaluation.   Path from Anal Canal Lesion Resection in OR 12/17/20: A. ANAL CANAL LESION, ANTERIOR MIDLINE, EXCISION:  - Polypoid fragment of benign squamous mucosa.   EGD 03/11/21: - Normal esophagus. Biopsied. - Bilious gastric fluid. - Medium-sized hiatal hernia. - Erythematous mucosa in the gastric body. Biopsied. - Normal examined duodenum. Biopsied. Path: 1. Surgical [P], duodenum - BENIGN DUODENAL MUCOSA - NO ACUTE INFLAMMATION, VILLOUS BLUNTING OR INCREASED INTRAEPITHELIAL LYMPHOCYTES IDENTIFIED 2. Surgical [P], gastric-erythema - MILD CHRONIC GASTRITIS WITHOUT ACTIVITY - NO H. PYLORI OR INTESTINAL METAPLASIA IDENTIFIED - Warthin-Starry stain is NEGATIVE for organisms morphologically consistent with Helicobacter pylori. 3. Surgical [P], esophagus - BENIGN SQUAMOUS MUCOSA - NO INCREASED INTRAEPITHELIAL EOSINOPHILS  Colonoscopy 03/11/21: -  The examined portion of the ileum was normal. Biopsied. - Biopsies were taken with a cold forceps for histology in the rectum, in the sigmoid colon, in the  descending colon, in the transverse colon, in the ascending colon and in the cecum. - Non-bleeding internal hemorrhoids. - Perianal fistula with seton. Path: 4. Surgical [P], small bowel, terminal ileum - BENIGN SMALL BOWEL MUCOSA WITH LYMPHOID AGGREGATES - NO ACUTE INFLAMMATION, GRANULOMAS OR MALIGNANCY IDENTIFIED 5. Surgical [P], right colon - BENIGN COLONIC MUCOSA - NO ACTIVE INFLAMMATION OR EVIDENCE OF MICROSCOPIC COLITIS - NO HIGH-GRADE DYSPLASIA OR MALIGNANCY IDENTIFIED 6. Surgical [P], colon, transverse - BENIGN COLONIC MUCOSA - NO ACTIVE INFLAMMATION OR EVIDENCE OF MICROSCOPIC COLITIS - NO HIGH-GRADE DYSPLASIA OR MALIGNANCY IDENTIFIED 7. Surgical [P], left colon - BENIGN COLONIC MUCOSA - NO ACTIVE INFLAMMATION OR EVIDENCE OF MICROSCOPIC COLITIS - NO HIGH-GRADE DYSPLASIA OR MALIGNANCY IDENTIFIED  ASSESSMENT AND PLAN:  Perianal fistula s/p seton placement Patient has been doing much better since her last clinic visit with me, mostly due to dietary changes that she has instituted.  Her EGD and colonoscopy only showed a hiatal hernia, mild gastric erythema, some bilious fluid, hemorrhoids, and her known perianal fistula. She did not have any signs of EoE, H. pylori, or celiac disease.  Her colon biopsies did not show any signs of IBD.  Thus at this point I suspect that her perianal fistula is primarily due to her Behcet's disease, for which she already takes Cimzia therapy. From my perspective, she is okay to proceed with surgical repair of her perianal fistula - Continue dietary changes - RTC PRN  Christia Reading, MD

## 2021-04-15 ENCOUNTER — Other Ambulatory Visit: Payer: Self-pay | Admitting: Surgery

## 2021-04-16 ENCOUNTER — Ambulatory Visit (INDEPENDENT_AMBULATORY_CARE_PROVIDER_SITE_OTHER): Payer: 59 | Admitting: Internal Medicine

## 2021-04-16 ENCOUNTER — Encounter: Payer: Self-pay | Admitting: Internal Medicine

## 2021-04-16 VITALS — BP 118/82 | HR 107 | Ht 59.0 in | Wt 150.0 lb

## 2021-04-16 DIAGNOSIS — K603 Anal fistula: Secondary | ICD-10-CM

## 2021-04-16 LAB — SARS CORONAVIRUS 2 (TAT 6-24 HRS): SARS Coronavirus 2: NEGATIVE

## 2021-04-16 NOTE — Patient Instructions (Signed)
If you are age 26 or younger, your body mass index should be between 19-25. Your Body mass index is 30.3 kg/m. If this is out of the aformentioned range listed, please consider follow up with your Primary Care Provider.   ________________________________________________________  The [facility] providers would like to encourage you to use [facility] to communicate with providers for non-urgent requests or questions.  Due to long hold times on the telephone, sending your provider a message by [facility] may be a faster and more efficient way to get a response.  Please allow 48 business hours for a response.  Please remember that this is for non-urgent requests.  _______________________________________________________  Dennis Bast will follow up with our office on an as needed basis.  It was nice to see you today.  I am glad you are feeling better.  Thank you for entrusting me with your care and choosing [facility].  Dr Lorenso Courier

## 2021-04-19 ENCOUNTER — Ambulatory Visit (HOSPITAL_BASED_OUTPATIENT_CLINIC_OR_DEPARTMENT_OTHER): Payer: 59 | Admitting: Anesthesiology

## 2021-04-19 ENCOUNTER — Encounter (HOSPITAL_BASED_OUTPATIENT_CLINIC_OR_DEPARTMENT_OTHER): Payer: Self-pay | Admitting: Surgery

## 2021-04-19 ENCOUNTER — Other Ambulatory Visit: Payer: Self-pay

## 2021-04-19 ENCOUNTER — Encounter (HOSPITAL_BASED_OUTPATIENT_CLINIC_OR_DEPARTMENT_OTHER)
Admission: RE | Disposition: A | Discharge status: Home / Self Care | Payer: Self-pay | Source: Ambulatory Visit | Attending: Surgery

## 2021-04-19 ENCOUNTER — Ambulatory Visit (HOSPITAL_COMMUNITY)
Admission: RE | Admit: 2021-04-19 | Discharge: 2021-04-19 | Disposition: A | Discharge status: Home / Self Care | Payer: 59 | Source: Ambulatory Visit | Attending: Surgery | Admitting: Surgery

## 2021-04-19 DIAGNOSIS — F17290 Nicotine dependence, other tobacco product, uncomplicated: Secondary | ICD-10-CM | POA: Insufficient documentation

## 2021-04-19 DIAGNOSIS — K603 Anal fistula: Secondary | ICD-10-CM | POA: Insufficient documentation

## 2021-04-19 DIAGNOSIS — K644 Residual hemorrhoidal skin tags: Secondary | ICD-10-CM | POA: Insufficient documentation

## 2021-04-19 DIAGNOSIS — K620 Anal polyp: Secondary | ICD-10-CM | POA: Diagnosis not present

## 2021-04-19 HISTORY — DX: Pain in unspecified joint: M25.50

## 2021-04-19 HISTORY — PX: LIGATION OF INTERNAL FISTULA TRACT: SHX6551

## 2021-04-19 HISTORY — PX: EXCISION OF SKIN TAG: SHX6270

## 2021-04-19 HISTORY — PX: RECTAL EXAM UNDER ANESTHESIA: SHX6399

## 2021-04-19 LAB — POCT PREGNANCY, URINE: Preg Test, Ur: NEGATIVE

## 2021-04-19 SURGERY — LIGATION, INTERNAL FISTULA TRACT
Anesthesia: General | Site: Rectum

## 2021-04-19 MED ORDER — ACETAMINOPHEN 160 MG/5ML PO SOLN: 325.0000 mg | ORAL | Status: DC | PRN

## 2021-04-19 MED ORDER — FENTANYL CITRATE (PF) 100 MCG/2ML IJ SOLN
INTRAMUSCULAR | Status: DC | PRN
  Administered 2021-04-19: 50 ug via INTRAVENOUS
  Administered 2021-04-19: 100 ug via INTRAVENOUS
  Administered 2021-04-19: 50 ug via INTRAVENOUS

## 2021-04-19 MED ORDER — ACETAMINOPHEN 500 MG PO TABS
1000.0000 mg | ORAL_TABLET | ORAL | Status: AC
  Administered 2021-04-19: 1000 mg via ORAL

## 2021-04-19 MED ORDER — SUGAMMADEX SODIUM 200 MG/2ML IV SOLN
INTRAVENOUS | Status: DC | PRN
  Administered 2021-04-19: 200 mg via INTRAVENOUS

## 2021-04-19 MED ORDER — DEXMEDETOMIDINE (PRECEDEX) IN NS 20 MCG/5ML (4 MCG/ML) IV SYRINGE
PREFILLED_SYRINGE | INTRAVENOUS | Status: DC | PRN
  Administered 2021-04-19: 8 ug via INTRAVENOUS
  Administered 2021-04-19: 12 ug via INTRAVENOUS

## 2021-04-19 MED ORDER — SODIUM CHLORIDE 0.9 % IV SOLN
2.0000 g | INTRAVENOUS | Status: AC
  Administered 2021-04-19: 2 g via INTRAVENOUS

## 2021-04-19 MED ORDER — SODIUM CHLORIDE 0.9 % IV SOLN
INTRAVENOUS | Status: AC
  Filled 2021-04-19: qty 100

## 2021-04-19 MED ORDER — ACETAMINOPHEN 325 MG PO TABS: 325.0000 mg | ORAL_TABLET | ORAL | Status: DC | PRN

## 2021-04-19 MED ORDER — LIDOCAINE 2% (20 MG/ML) 5 ML SYRINGE
INTRAMUSCULAR | Status: DC | PRN
  Administered 2021-04-19: 100 mg via INTRAVENOUS

## 2021-04-19 MED ORDER — BUPIVACAINE LIPOSOME 1.3 % IJ SUSP: 20.0000 mL | Freq: Once | INTRAMUSCULAR | Status: DC

## 2021-04-19 MED ORDER — FLEET ENEMA 7-19 GM/118ML RE ENEM: 1.0000 | ENEMA | Freq: Once | RECTAL | Status: DC

## 2021-04-19 MED ORDER — OXYCODONE HCL 5 MG PO TABS: 5.0000 mg | ORAL_TABLET | Freq: Once | ORAL | Status: DC | PRN

## 2021-04-19 MED ORDER — BUPIVACAINE LIPOSOME 1.3 % IJ SUSP
INTRAMUSCULAR | Status: DC | PRN
  Administered 2021-04-19: 16 mL

## 2021-04-19 MED ORDER — BUPIVACAINE-EPINEPHRINE 0.25% -1:200000 IJ SOLN
INTRAMUSCULAR | Status: DC | PRN
  Administered 2021-04-19: 24 mL

## 2021-04-19 MED ORDER — ROCURONIUM BROMIDE 10 MG/ML (PF) SYRINGE
PREFILLED_SYRINGE | INTRAVENOUS | Status: DC | PRN
  Administered 2021-04-19: 60 mg via INTRAVENOUS

## 2021-04-19 MED ORDER — FENTANYL CITRATE (PF) 100 MCG/2ML IJ SOLN: 25.0000 ug | INTRAMUSCULAR | Status: DC | PRN

## 2021-04-19 MED ORDER — OXYCODONE HCL 5 MG/5ML PO SOLN: 5.0000 mg | Freq: Once | ORAL | Status: DC | PRN

## 2021-04-19 MED ORDER — FENTANYL CITRATE (PF) 100 MCG/2ML IJ SOLN
INTRAMUSCULAR | Status: AC
  Filled 2021-04-19: qty 2

## 2021-04-19 MED ORDER — DEXMEDETOMIDINE (PRECEDEX) IN NS 20 MCG/5ML (4 MCG/ML) IV SYRINGE
PREFILLED_SYRINGE | INTRAVENOUS | Status: AC
  Filled 2021-04-19: qty 5

## 2021-04-19 MED ORDER — ONDANSETRON HCL 4 MG/2ML IJ SOLN
INTRAMUSCULAR | Status: AC
  Filled 2021-04-19: qty 2

## 2021-04-19 MED ORDER — LACTATED RINGERS IV SOLN
INTRAVENOUS | Status: DC
  Administered 2021-04-19: 1000 mL via INTRAVENOUS

## 2021-04-19 MED ORDER — ACETAMINOPHEN 500 MG PO TABS
ORAL_TABLET | ORAL | Status: AC
  Filled 2021-04-19: qty 2

## 2021-04-19 MED ORDER — TRAMADOL HCL 50 MG PO TABS: 50.0000 mg | ORAL_TABLET | Freq: Four times a day (QID) | ORAL | 0 refills | Status: AC | PRN

## 2021-04-19 MED ORDER — MIDAZOLAM HCL 2 MG/2ML IJ SOLN
INTRAMUSCULAR | Status: AC
  Filled 2021-04-19: qty 2

## 2021-04-19 MED ORDER — CEFTRIAXONE SODIUM 2 G IJ SOLR
INTRAMUSCULAR | Status: AC
  Filled 2021-04-19: qty 20

## 2021-04-19 MED ORDER — MIDAZOLAM HCL 2 MG/2ML IJ SOLN
2.0000 mg | Freq: Once | INTRAMUSCULAR | Status: AC
  Administered 2021-04-19: 2 mg via INTRAVENOUS

## 2021-04-19 MED ORDER — ONDANSETRON HCL 4 MG/2ML IJ SOLN
INTRAMUSCULAR | Status: DC | PRN
  Administered 2021-04-19: 4 mg via INTRAVENOUS

## 2021-04-19 MED ORDER — ROCURONIUM BROMIDE 10 MG/ML (PF) SYRINGE
PREFILLED_SYRINGE | INTRAVENOUS | Status: AC
  Filled 2021-04-19: qty 10

## 2021-04-19 MED ORDER — LIDOCAINE 2% (20 MG/ML) 5 ML SYRINGE
INTRAMUSCULAR | Status: AC
  Filled 2021-04-19: qty 5

## 2021-04-19 MED ORDER — PROPOFOL 10 MG/ML IV BOLUS
INTRAVENOUS | Status: DC | PRN
  Administered 2021-04-19: 150 mg via INTRAVENOUS

## 2021-04-19 MED ORDER — DEXAMETHASONE SODIUM PHOSPHATE 10 MG/ML IJ SOLN
INTRAMUSCULAR | Status: AC
  Filled 2021-04-19: qty 1

## 2021-04-19 MED ORDER — MIDAZOLAM HCL 2 MG/2ML IJ SOLN
INTRAMUSCULAR | Status: DC | PRN
  Administered 2021-04-19: 2 mg via INTRAVENOUS

## 2021-04-19 MED ORDER — DEXAMETHASONE SODIUM PHOSPHATE 10 MG/ML IJ SOLN
INTRAMUSCULAR | Status: DC | PRN
  Administered 2021-04-19: 10 mg via INTRAVENOUS

## 2021-04-19 MED ORDER — MEPERIDINE HCL 25 MG/ML IJ SOLN: 6.2500 mg | INTRAMUSCULAR | Status: DC | PRN

## 2021-04-19 MED ORDER — ONDANSETRON HCL 4 MG/2ML IJ SOLN: 4.0000 mg | Freq: Once | INTRAMUSCULAR | Status: DC | PRN

## 2021-04-19 MED ORDER — 0.9 % SODIUM CHLORIDE (POUR BTL) OPTIME
TOPICAL | Status: DC | PRN
  Administered 2021-04-19: 500 mL

## 2021-04-19 SURGICAL SUPPLY — 71 items
APL SKNCLS STERI-STRIP NONHPOA (GAUZE/BANDAGES/DRESSINGS) ×4
BENZOIN TINCTURE PRP APPL 2/3 (GAUZE/BANDAGES/DRESSINGS) ×6 IMPLANT
BLADE EXTENDED COATED 6.5IN (ELECTRODE) IMPLANT
BLADE SURG 10 STRL SS (BLADE) IMPLANT
BLADE SURG 15 STRL LF DISP TIS (BLADE) ×2 IMPLANT
BLADE SURG 15 STRL SS (BLADE) ×3
COVER BACK TABLE 60X90IN (DRAPES) ×3 IMPLANT
COVER MAYO STAND STRL (DRAPES) ×3 IMPLANT
DECANTER SPIKE VIAL GLASS SM (MISCELLANEOUS) ×3 IMPLANT
DRAPE LAPAROTOMY 100X72 PEDS (DRAPES) ×3 IMPLANT
DRAPE UTILITY XL STRL (DRAPES) ×3 IMPLANT
DRSG PAD ABDOMINAL 8X10 ST (GAUZE/BANDAGES/DRESSINGS) ×3 IMPLANT
ELECT NDL BLADE 2-5/6 (NEEDLE) IMPLANT
ELECT NDL TIP 2.8 STRL (NEEDLE) IMPLANT
ELECT NEEDLE BLADE 2-5/6 (NEEDLE) ×3 IMPLANT
ELECT NEEDLE TIP 2.8 STRL (NEEDLE) ×3 IMPLANT
GAUZE 4X4 16PLY ~~LOC~~+RFID DBL (SPONGE) ×3 IMPLANT
GAUZE SPONGE 4X4 12PLY STRL (GAUZE/BANDAGES/DRESSINGS) ×3 IMPLANT
GAUZE SPONGE 4X4 12PLY STRL LF (GAUZE/BANDAGES/DRESSINGS) ×2 IMPLANT
GLOVE SRG 8 PF TXTR STRL LF DI (GLOVE) ×2 IMPLANT
GLOVE SURG ENC MOIS LTX SZ7.5 (GLOVE) ×3 IMPLANT
GLOVE SURG UNDER POLY LF SZ8 (GLOVE) ×3
GOWN STRL REUS W/TWL LRG LVL3 (GOWN DISPOSABLE) ×3 IMPLANT
HYDROGEN PEROXIDE 16OZ (MISCELLANEOUS) IMPLANT
IV CATH 14GX2 1/4 (CATHETERS) IMPLANT
IV CATH 18G SAFETY (IV SOLUTION) IMPLANT
KIT SIGMOIDOSCOPE (SET/KITS/TRAYS/PACK) IMPLANT
KIT TURNOVER CYSTO (KITS) ×3 IMPLANT
LOOP VESSEL MAXI BLUE (MISCELLANEOUS) IMPLANT
NDL SAFETY ECLIPSE 18X1.5 (NEEDLE) IMPLANT
NEEDLE HYPO 18GX1.5 SHARP (NEEDLE)
NEEDLE HYPO 22GX1.5 SAFETY (NEEDLE) ×3 IMPLANT
NS IRRIG 500ML POUR BTL (IV SOLUTION) ×3 IMPLANT
PACK BASIN DAY SURGERY FS (CUSTOM PROCEDURE TRAY) ×3 IMPLANT
PANTS MESH DISP LRG (UNDERPADS AND DIAPERS) ×2 IMPLANT
PANTS MESH DISPOSABLE L (UNDERPADS AND DIAPERS) ×1
PENCIL SMOKE EVACUATOR (MISCELLANEOUS) ×3 IMPLANT
RETRACTOR RING URO 16.6X16.6 (MISCELLANEOUS) ×3 IMPLANT
RETRACTOR STAY HOOK 5MM (MISCELLANEOUS) ×4 IMPLANT
RETRACTOR STERILE 25.8CMX11.3 (INSTRUMENTS) ×1 IMPLANT
SPONGE HEMORRHOID 8X3CM (HEMOSTASIS) IMPLANT
SPONGE SURGIFOAM ABS GEL 12-7 (HEMOSTASIS) IMPLANT
SUCTION FRAZIER HANDLE 10FR (MISCELLANEOUS) ×3
SUCTION TUBE FRAZIER 10FR DISP (MISCELLANEOUS) ×2 IMPLANT
SUT CHROMIC 2 0 SH (SUTURE) IMPLANT
SUT CHROMIC 3 0 SH 27 (SUTURE) IMPLANT
SUT MNCRL AB 4-0 PS2 18 (SUTURE) IMPLANT
SUT SILK 0 PSL NDL (SUTURE) IMPLANT
SUT SILK 0 TIES 10X30 (SUTURE) IMPLANT
SUT SILK 2 0 (SUTURE)
SUT SILK 2-0 18XBRD TIE 12 (SUTURE) IMPLANT
SUT VIC AB 2-0 SH 27 (SUTURE)
SUT VIC AB 2-0 SH 27XBRD (SUTURE) IMPLANT
SUT VIC AB 2-0 UR6 27 (SUTURE) ×3 IMPLANT
SUT VIC AB 3-0 SH 18 (SUTURE) ×1 IMPLANT
SUT VIC AB 3-0 SH 27 (SUTURE) ×6
SUT VIC AB 3-0 SH 27X BRD (SUTURE) IMPLANT
SUT VIC AB 3-0 SH 27XBRD (SUTURE) IMPLANT
SUT VIC AB 4-0 P-3 18XBRD (SUTURE) IMPLANT
SUT VIC AB 4-0 P3 18 (SUTURE)
SUT VIC AB 4-0 PS2 18 (SUTURE) ×1 IMPLANT
SUT VICRYL 0 UR6 27IN ABS (SUTURE) IMPLANT
SUT VICRYL 2 0 18  UND BR (SUTURE)
SUT VICRYL 2 0 18 UND BR (SUTURE) IMPLANT
SYR 20ML LL LF (SYRINGE) IMPLANT
SYR BULB IRRIG 60ML STRL (SYRINGE) ×3 IMPLANT
SYR CONTROL 10ML LL (SYRINGE) ×6 IMPLANT
TOWEL OR 17X26 10 PK STRL BLUE (TOWEL DISPOSABLE) ×3 IMPLANT
TRAY DSU PREP LF (CUSTOM PROCEDURE TRAY) ×3 IMPLANT
TUBE CONNECTING 12X1/4 (SUCTIONS) ×3 IMPLANT
YANKAUER SUCT BULB TIP NO VENT (SUCTIONS) IMPLANT

## 2021-04-19 NOTE — H&P (Signed)
CC: Here today for surgery  HPI: [name] is an 25 y.o. 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  This was incised and drained.  She had followed up in our office with our PA and apparently this closed but then over the ensuing months continued to open and drain spontaneously.  She was seen by one of my partners in follow-up and was found to have a recurrent perianal abscess.  This was I&D'd.  She was referred to see me for further evaluation.   She has never had a colonoscopy  She does not report any known personal history of Crohn's disease.  She does report a family history of Crohn's disease in her maternal aunt.   Her mother is with her in preop this morning. She denies any changes in her health, health history or allergies since we met in the office. States states she is nervous but ready for surgery     PMH:  Behcet's disease (reports she has primarily neurologic manifestations including lesions in her brain when she has flares well-developed balance issues; also with history of ulcers in her mouth and perivaginal areas), perianal abscess  INTERVAL HX She was taken to the OR 12/17/20 - underwent: Incision and drainage of perianal abscess Placement of draining seton Excision of perianal lesion, anterior midline (will also serve as an anal canal tissue biopsy) Anorectal exam under anesthesia  Findings - chronic appearing wound/skin in the right anterior anal margin.  This was able to be opened and approximately 1 cc of purulent fluid spontaneously then drained.  There was a palpable cord extending from this collection to the anterior midline.  Without any difficulty, this tract was able to be probed and findings are consistent with a low transsphincteric anal fistula.  Internal opening noted just distal to the dentate line in the anterior midline position.  External opening right anterior.   A blue vessel loop draining seton was placed. Ultimately felt she would be a reasonable candidate for a LIFT procedure.  She denies any changes in her health or health history - states she is ready for her surgery today. Has had seton with scant drainage. No recurrent abscesses.   PSH: I&D of perianal abscesses   FHx: Denies FHx of colorectal, breast, endometrial, ovarian or cervical cancer. +FHx of Crohn's in maternal aunt   Social: Denies use of EtOH/drugs; +Vape with nicotine daily. 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  Past Medical History:  Diagnosis Date   Anal fistula 12/15/2020   Anxiety    Behcet's disease ([place])    hx. brain lesions, mouth and skin ulcers   Depression    GERD (gastroesophageal reflux disease)    History of MRSA infection 2015   arms, legs   History of seizures as a child    due to Behcet's disease - no seizures since age 28   Joint pain    due to Behcet's syndrome   Laceration of wrist with tendon involvement 11/65/7903   self-inflicted   Mouth ulcer 12/15/2020   Perianal abscess 12/2019   Scoliosis 12/15/2020   Social anxiety disorder     Past Surgical History:  Procedure Laterality Date   ARTERY AND TENDON REPAIR Left 03/04/2014   Procedure: ARTERY AND TENDON REPAIR;  Surgeon: Lennette Bihari  Fredna Dow, MD;  Location: [facility];  Service: Orthopedics;  Laterality: Left;   COLONOSCOPY WITH ESOPHAGOGASTRODUODENOSCOPY (EGD)  03/11/2021   LUMBAR PUNCTURE  04/28/2003   under anesthesia   PLACEMENT OF SETON N/A 12/17/2020   Procedure: PLACEMENT OF PERIANAL SETON;  Surgeon: Ileana Roup, MD;  Location: [facility];  Service: General;  Laterality: N/A;   RECTAL EXAM UNDER ANESTHESIA N/A 12/17/2020   Procedure: ANORECTAL EXAM UNDER ANESTHESIA;  Surgeon: Ileana Roup, MD;  Location: [facility];  Service: General;  Laterality: N/A;   WISDOM TOOTH EXTRACTION     WOUND EXPLORATION Left 03/04/2014   Procedure: LEFT WRIST EXPLORATION;  Surgeon: Leanora Cover, MD;  Location: [facility];  Service: Orthopedics;  Laterality: Left;    Family History  Problem Relation Age of Onset   Depression Mother    Berenice Primas' disease Mother    Irritable bowel syndrome Mother    ADD / ADHD Brother    OCD Brother    Bipolar disorder Maternal Uncle    Colon cancer Maternal Uncle 61   Crohn's disease Cousin    Lupus Cousin    Esophageal cancer Neg Hx    Rectal cancer Neg Hx    Stomach cancer Neg Hx     Social:  reports that she has been smoking e-cigarettes. She has never used smokeless tobacco. She reports current alcohol use of about 1.0 standard drink per week. She reports that she does not currently use drugs after having used the following drugs: Marijuana.  Allergies:  Allergies  Allergen Reactions   Colchicine Shortness Of Breath   Milk-Related Compounds Other (See Comments)    GI UPSET   Sulfa Antibiotics Hives and Swelling    SWELLING LIPS   Adhesive [Tape] Other (See Comments)    SKIN IRRITATION   Soap Itching    Certain scented soaps    Medications: I have reviewed the patient's current medications.  Results for orders placed or performed during the hospital encounter of 04/19/21 (from the past 48 hour(s))  Pregnancy, urine POC     Status: None   Collection Time: 04/19/21 11:27 AM  Result Value Ref Range   Preg Test, Ur NEGATIVE NEGATIVE    Comment:        THE SENSITIVITY OF THIS METHODOLOGY IS >24 mIU/mL     No results found.  ROS - all of the below systems have been reviewed with the patient and positives are indicated with bold text General: chills, fever or night sweats Eyes: blurry vision or double vision ENT: epistaxis or sore throat Allergy/Immunology: itchy/watery eyes or nasal congestion Hematologic/Lymphatic: bleeding  problems, blood clots or swollen lymph nodes Endocrine: temperature intolerance or unexpected weight changes Breast: new or changing breast lumps or nipple discharge Resp: cough, shortness of breath, or wheezing CV: chest pain or dyspnea on exertion GI: as per HPI GU: dysuria, trouble voiding, or hematuria MSK: joint pain or joint stiffness Neuro: TIA or stroke symptoms Derm: pruritus and skin lesion changes Psych: anxiety and depression  PE Blood pressure 129/82, pulse 91, temperature 98.8 F (37.1 C), temperature source Oral, resp. rate 14, height _0  (1.499 m), weight 67.9 kg, last menstrual period 03/29/2021, SpO2 99 %. Constitutional: NAD; conversant; wearing mask Eyes: Moist conjunctiva; no lid lag Lungs: Normal respiratory effort CV: RRR MSK: Normal range of motion of extremities Psychiatric: Appropriate affect; alert and oriented x3  Results for orders placed or performed during the hospital encounter  of 04/19/21 (from the past 48 hour(s))  Pregnancy, urine POC     Status: None   Collection Time: 04/19/21 11:27 AM  Result Value Ref Range   Preg Test, Ur NEGATIVE NEGATIVE    Comment:        THE SENSITIVITY OF THIS METHODOLOGY IS >24 mIU/mL     No results found.   A/P: IEASHA Jacqueline Simpson is an 25 y.o. female with hx of Behcet's - found 12/17/20 to have transsphincteric anal fistula - had draining seton placed - now here for probable LIFT procedure  -The anatomy and physiology of the anal canal has been reviewed with the patient. The pathophysiology of transsphincteric fistulas was covered as well. -We have reviewed options going forward including further observation vs surgery - ligation of intersphincteric fistulous tract, anorectal exam under anesthesia -The planned procedure, material risks (including, but not limited to, pain, bleeding, infection, scarring, need for blood transfusion, damage to anal sphincter, incontinence of gas and/or stool, recurrence rates of  ~20% following LIFT procedure, pneumonia, heart attack, stroke, death) benefits and alternatives to surgery were discussed at length. The patient's questions were answered to her satisfaction, she voiced understanding and elected to proceed with surgery. Additionally, we discussed typical postoperative expectations and the recovery process. -She has requested I update her mother, [name] following the procedure  Nadeen Landau, MD [facility] Use [email or web address] to contact on call provider

## 2021-04-19 NOTE — Discharge Instructions (Addendum)
ANORECTAL SURGERY: POST OP INSTRUCTIONS  DIET: Follow a light bland diet the first 24 hours after arrival home, such as soup, liquids, crackers, etc.  Be sure to include lots of fluids daily.  Avoid fast food or heavy meals as your are more likely to get nauseated.  Eat a low fat diet the next few days after surgery.   Some bleeding with bowel movements is expected for the first couple of days but this should stop in between bowel movements  Take your usually prescribed home medications unless otherwise directed.  PAIN CONTROL: It is helpful to take an over-the-counter pain medication regularly for the first few days/weeks.  Choose from the following that works best for you: Ibuprofen (Advil, etc) Three 200mg tabs every 6 hours as needed. Acetaminophen (Tylenol, etc) 500-650mg every 6 hours as needed NOTE: You may take both of these medications together - most patients find it most helpful when alternating between the two (i.e. Ibuprofen at 6am, tylenol at 9am, ibuprofen at 12pm ...) A  prescription for pain medication may have been prescribed for you at discharge.  Take your pain medication as prescribed.  If you are having problems/concerns with the prescription medicine, please call us for further advice.  Avoid getting constipated.  Between the surgery and the pain medications, it is common to experience some constipation.  Increasing fluid intake (64oz of water per day) and taking a fiber supplement (such as Metamucil, Citrucel, FiberCon) 1-2 times a day regularly will usually help prevent this problem from occurring.  Take Miralax (over the counter) 1-2x/day while taking a narcotic pain medication. If no bowel movement after 48hours, you may additionally take a laxative like a bottle of Milk of Magnesia which can be purchased over the counter. Avoid enemas if possible as these are often painful.   Watch out for diarrhea.  If you have many loose bowel movements, simplify your diet to bland  foods.  Stop any stool softeners and decrease your fiber supplement. If this worsens or does not improve, please call us.  Wash / shower every day.  If you were discharged with a dressing, you may remove this the day after your surgery. You may shower normally, getting soap/water on your wound, particularly after bowel movements.  Soaking in a warm bath filled a couple inches ("Sitz bath") is a great way to clean the area after a bowel movement and many patients find it is a way to soothe the area.  ACTIVITIES as tolerated:   You may resume regular (light) daily activities beginning the next day--such as daily self-care, walking, climbing stairs--gradually increasing activities as tolerated.  If you can walk 30 minutes without difficulty, it is safe to try more intense activity such as jogging, treadmill, bicycling, low-impact aerobics, etc. Refrain from any heavy lifting or straining for the first 2 weeks after your procedure, particularly if your surgery was for hemorrhoids. Avoid activities that make your pain worse You may drive when you are no longer taking prescription pain medication, you can comfortably wear a seatbelt, and you can safely maneuver your car and apply brakes.  FOLLOW UP in our office Please call [facility] at [phone] to set up an appointment to see your surgeon in the office for a follow-up appointment approximately 2 weeks after your surgery. Make sure that you call for this appointment the day you arrive home to insure a convenient appointment time.  9. If you have disability or family leave forms that need to be completed,   you may have them completed by your primary care physician's office; for return to work instructions, please ask our office staff and they will be happy to assist you in obtaining this documentation   When to call us [phone]: Poor pain control Reactions / problems with new medications (rash/itching, etc)  Fever over 101.5 F (38.5 C) Inability  to urinate Nausea/vomiting Worsening swelling or bruising Continued bleeding from incision. Increased pain, redness, or drainage from the incision  The clinic staff is available to answer your questions during regular business hours (8:30am-5pm).  Please don't hesitate to call and ask to speak to one of our nurses for clinical concerns.   A surgeon from [facility] is always on call at the hospitals   If you have a medical emergency, go to the nearest emergency room or call 911.   [facility] [address] [place], [place], [place]  [address] MAIN: [phone] FAX: [phone] [email or web address]    NO TYLENOL CONTAINING PRODUCTS UNTIL AFTER 6:00 PM TODAY.   Information for Discharge Teaching: EXPAREL (bupivacaine liposome injectable suspension)   Your surgeon or anesthesiologist gave you EXPAREL(bupivacaine) to help control your pain after surgery.  EXPAREL is a local anesthetic that provides pain relief by numbing the tissue around the surgical site. EXPAREL is designed to release pain medication over time and can control pain for up to 72 hours. Depending on how you respond to EXPAREL, you may require less pain medication during your recovery.  Possible side effects: Temporary loss of sensation or ability to move in the area where bupivacaine was injected. Nausea, vomiting, constipation Rarely, numbness and tingling in your mouth or lips, lightheadedness, or anxiety may occur. Call your doctor right away if you think you may be experiencing any of these sensations, or if you have other questions regarding possible side effects.  Follow all other discharge instructions given to you by your surgeon or nurse. Eat a healthy diet and drink plenty of water or other fluids.  If you return to the hospital for any reason within 96 hours following the administration of EXPAREL, it is important for health care  providers to know that you have received this anesthetic. A teal colored band has been placed on your arm with the date, time and amount of EXPAREL you have received in order to alert and inform your health care providers. Please leave this armband in place for the full 96 hours following administration, and then you may remove the band.      Post Anesthesia Home Care Instructions  Activity: Get plenty of rest for the remainder of the day. A responsible individual must stay with you for 24 hours following the procedure.  For the next 24 hours, DO NOT: -Drive a car -Paediatric nurse -Drink alcoholic beverages -Take any medication unless instructed by your physician -Make any legal decisions or sign important papers.  Meals: Start with liquid foods such as gelatin or soup. Progress to regular foods as tolerated. Avoid greasy, spicy, heavy foods. If nausea and/or vomiting occur, drink only clear liquids until the nausea and/or vomiting subsides. Call your physician if vomiting continues.  Special Instructions/Symptoms: Your throat may feel dry or sore from the anesthesia or the breathing tube placed in your throat during surgery. If this causes discomfort, gargle with warm salt water. The discomfort should disappear within 24 hours.

## 2021-04-19 NOTE — Anesthesia Procedure Notes (Signed)
Procedure Name: Intubation Date/Time: 04/19/2021 1:42 PM Performed by: Genelle Bal, CRNA Pre-anesthesia Checklist: Patient identified, Emergency Drugs available, Suction available and Patient being monitored Patient Re-evaluated:Patient Re-evaluated prior to induction Oxygen Delivery Method: Circle system utilized Preoxygenation: Pre-oxygenation with 100% oxygen Induction Type: IV induction Ventilation: Mask ventilation without difficulty Laryngoscope Size: Miller and 2 Grade View: Grade I Tube type: Oral Tube size: 7.0 mm Number of attempts: 1 Airway Equipment and Method: Stylet and Oral airway Placement Confirmation: ETT inserted through vocal cords under direct vision, positive ETCO2 and breath sounds checked- equal and bilateral Secured at: 21 cm Tube secured with: Tape Dental Injury: Teeth and Oropharynx as per pre-operative assessment

## 2021-04-19 NOTE — Transfer of Care (Signed)
Immediate Anesthesia Transfer of Care Note  Patient: Jacqueline Simpson  Procedure(s) Performed: LIGATION OF INTERNAL FISTULA TRACT (Rectum) ANAL RECTAL EXAM UNDER ANESTHESIA (Rectum) EXCISION OF ANAL TAG (Anus)  Patient Location: PACU  Anesthesia Type:General  Level of Consciousness: drowsy and patient cooperative  Airway & Oxygen Therapy: Patient Spontanous Breathing and Patient connected to face mask oxygen  Post-op Assessment: Report given to RN and Post -op Vital signs reviewed and stable  Post vital signs: Reviewed and stable  Last Vitals:  Vitals Value Taken Time  BP 94/40 04/19/21 1500  Temp    Pulse 100 04/19/21 1504  Resp 17 04/19/21 1504  SpO2 100 % 04/19/21 1504  Vitals shown include unvalidated device data.  Last Pain:  Vitals:   04/19/21 1152  TempSrc:   PainSc: 0-No pain      Patients Stated Pain Goal: 7 (76/54/65 0354)  Complications: No notable events documented.

## 2021-04-19 NOTE — Op Note (Signed)
04/19/2021  3:28 PM  PATIENT:  Jacqueline Simpson  25 y.o. female  Patient Care Team: [facility] as PCP - General (Family Medicine)  PRE-OPERATIVE DIAGNOSIS: Transsphincteric anal fistula  POST-OPERATIVE DIAGNOSIS:   Transsphincteric anal fistula Anterior midline anal tag  PROCEDURE:   Ligation of intersphincteric fistulous tract (LIFT) Excision of perianal skin tag  SURGEON:  Surgeon(s): Juandaniel Manfredo, Sharon Mt, MD Ileana Roup, MD   ANESTHESIA:   local and general  SPECIMEN:  Perianal skin tag, anterior midline  DISPOSITION OF SPECIMEN:  PATHOLOGY  COUNTS:  Sponge, needle, and instrument counts were reported correct x2 at conclusion.  EBL: 5 mL  Drains: None  PLAN OF CARE: Discharge to home after PACU  PATIENT DISPOSITION:  PACU - hemodynamically stable.  OR FINDINGS: Low transsphincteric anal fistula -external opening in the right anterior anal margin, internal opening anterior midline just distal to the dentate line.  There is an associated tag at this location as well that is approximately 1 cm in size that is excised.  Ligation of intersphincteric fistulous tract was then carried out.  External opening landscaped to facilitate drainage.  DESCRIPTION: The patient was identified in the preoperative holding area and taken to the OR. SCDs were applied. She then underwent general endotracheal anesthesia without difficulty. She was then rolled onto the OR table in the prone jackknife position. Pressure points were then evaluated and padded. Benzoin was applied to the buttocks and they were gently taped apart.  She was then prepped and draped in usual sterile fashion.  A surgical timeout was performed indicating the correct patient, procedure, and positioning.  A perianal block was then created using a dilute mixture of 0.25% Marcaine with epinephrine and Exparel.  After ascertaining an appropriate level of anesthesia had been achieved, a  well lubricated digital rectal exam was performed. This demonstrated no palpable abnormalities.  A Hill-Ferguson anoscope was into the anal canal and circumferential inspection demonstrated a blue vessel loop draining seton emanating into the internal opening in the anterior midline just distal to the dentate line.  External opening in the right anterior anal margin.  The seton was exchanged for a rigid fistula probe.  The tract was palpated and found to be a low transsphincteric fistula.  The intersphincteric groove was palpated.  A curvilinear incision was created over this.  Care was taken to stay within the plane between the external/internal sphincter muscles and no sphincter muscle was divided.  A L[place] Star retractor was placed.  There is a apparent fistulous tract also identified in the exact same plane with no evident internal opening but nonetheless a tubular structure coursing in the intersphincteric plane that was separately circumferentially dissected.  This was ligated and both ends were suture ligated then with 2-0 Vicryl figure-of-eight sutures.  The more dominant fistulous tract was also circumferentially dissected within the intersphincteric plane.  This was then controlled with clamps and the metal fistula probe removed.  This was divided between clamps and suture ligated with figure-of-eight 2-0 Vicryl sutures.  Additional sutures were placed imbricating some of the internal sphincter over the apparent internal opening that had been suture-ligated.  The tract was then gently probed and found to be completely excluded.  There is no communication with the anal canal or the intersphincteric dissection.  The wound is irrigated and hemostasis appreciated.  The L[place] Star was removed.  This was then closed in layers using 3-0 Vicryl deep dermal sutures followed by a running 4-0  Vicryl subcuticular type stitch.  There is a approximate 1 similar pedunculated type hypertrophic anal papilla/tag in the  anterior midline.  This was excised with cautery and passed off the specimen.  All counts were reported correct.  Additional anesthetic was infiltrated into the tissues around the anal canal.  The buttocks were then untaped.  A dressing consisting of 4 x 4's, ABD, and mesh underwear was placed.  She was rolled back onto a stretcher, wait for anesthesia, extubated, and transported PACU in satisfactory condition.  DISPOSITION: PACU in satisfactory condition.

## 2021-04-19 NOTE — Anesthesia Preprocedure Evaluation (Signed)
Anesthesia Evaluation  Patient identified by MRN, date of birth, ID band Patient awake    Reviewed: Allergy & Precautions, NPO status , Patient's Chart, lab work & pertinent test results  Airway Mallampati: II  TM Distance: >3 FB Neck ROM: Full    Dental no notable dental hx. (+) Teeth Intact, Dental Advisory Given   Pulmonary neg pulmonary ROS, Current Smoker and Patient abstained from smoking.,    Pulmonary exam normal breath sounds clear to auscultation       Cardiovascular negative cardio ROS Normal cardiovascular exam Rhythm:Regular Rate:Normal     Neuro/Psych PSYCHIATRIC DISORDERS Anxiety Depression negative neurological ROS     GI/Hepatic GERD  ,(+)     substance abuse  alcohol use,   Endo/Other  negative endocrine ROS  Renal/GU negative Renal ROS     Musculoskeletal negative musculoskeletal ROS (+)   Abdominal   Peds  Hematology negative hematology ROS (+) Hx of Bechets DZ   Anesthesia Other Findings   Reproductive/Obstetrics negative OB ROS                             Anesthesia Physical  Anesthesia Plan  ASA: 2  Anesthesia Plan: General   Post-op Pain Management:    Induction: Intravenous  PONV Risk Score and Plan: 3 and Ondansetron, Treatment may vary due to age or medical condition and Midazolam  Airway Management Planned: LMA and Oral ETT  Additional Equipment: None  Intra-op Plan:   Post-operative Plan:   Informed Consent: I have reviewed the patients History and Physical, chart, labs and discussed the procedure including the risks, benefits and alternatives for the proposed anesthesia with the patient or authorized representative who has indicated his/her understanding and acceptance.     Dental advisory given  Plan Discussed with: Anesthesiologist and CRNA  Anesthesia Plan Comments:         Anesthesia Quick Evaluation

## 2021-04-19 NOTE — Anesthesia Postprocedure Evaluation (Signed)
Anesthesia Post Note  Patient: Maciah Feeback Droge  Procedure(s) Performed: LIGATION OF INTERNAL FISTULA TRACT (Rectum) ANAL RECTAL EXAM UNDER ANESTHESIA (Rectum) EXCISION OF ANAL TAG (Anus)     Patient location during evaluation: PACU Anesthesia Type: General Level of consciousness: awake and alert Pain management: pain level controlled Vital Signs Assessment: post-procedure vital signs reviewed and stable Respiratory status: spontaneous breathing, nonlabored ventilation, respiratory function stable and patient connected to nasal cannula oxygen Cardiovascular status: blood pressure returned to baseline and stable Postop Assessment: no apparent nausea or vomiting Anesthetic complications: no   No notable events documented.  Last Vitals:  Vitals:   04/19/21 1515 04/19/21 1530  BP: (!) 82/45 (!) 88/46  Pulse: 84 80  Resp: 17 14  Temp:    SpO2: 100% 97%    Last Pain:  Vitals:   04/19/21 1500  TempSrc:   PainSc: 0-No pain                 Arpi Diebold

## 2021-04-20 ENCOUNTER — Encounter (HOSPITAL_BASED_OUTPATIENT_CLINIC_OR_DEPARTMENT_OTHER): Payer: Self-pay | Admitting: Surgery

## 2021-04-20 LAB — SURGICAL PATHOLOGY

## 2021-07-01 ENCOUNTER — Telehealth: Payer: Self-pay | Admitting: Neurology

## 2021-07-01 ENCOUNTER — Other Ambulatory Visit: Payer: Self-pay | Admitting: *Deleted

## 2021-07-01 MED ORDER — METHYLPREDNISOLONE 4 MG PO TABS: ORAL_TABLET | ORAL | 0 refills | Status: DC

## 2021-07-01 NOTE — Telephone Encounter (Signed)
Took call from phone staff and spoke w/ pt. Relayed per Dr. [name] Shelling that we will call in medrol dos pak 4mg  tab for her to take 6 tabs day 1 then decrease by 1 tablet daily until finished. Rx e-scribed. Pt asked about using prednisone 5mg  tab rx she was rx'd by rheumatologist last month but advised per MD he would like her to take the medrol dos pak prescribed by him instead for current sx.   Also scheduled sooner f/u for 07/06/21 at 930am w/ Dr. [name] Shelling. Asked she check in by 9:00/9:15am, bring updated insurance card/med list to appt. Cx appt in April since she is coming in sooner.

## 2021-07-01 NOTE — Telephone Encounter (Signed)
Pt is asking for a call to discuss dizziness she has had since last night and brain fog since yesterday as well, please call.

## 2021-07-01 NOTE — Telephone Encounter (Signed)
LVM returning pt call. 

## 2021-07-06 ENCOUNTER — Ambulatory Visit: Payer: 59 | Admitting: Neurology

## 2021-07-06 ENCOUNTER — Encounter: Payer: Self-pay | Admitting: Neurology

## 2021-07-06 VITALS — BP 126/86 | HR 102 | Ht 59.0 in | Wt 150.0 lb

## 2021-07-06 DIAGNOSIS — Z8669 Personal history of other diseases of the nervous system and sense organs: Secondary | ICD-10-CM | POA: Diagnosis not present

## 2021-07-06 DIAGNOSIS — F4010 Social phobia, unspecified: Secondary | ICD-10-CM

## 2021-07-06 DIAGNOSIS — R1310 Dysphagia, unspecified: Secondary | ICD-10-CM

## 2021-07-06 DIAGNOSIS — M352 Behcet's disease: Secondary | ICD-10-CM | POA: Diagnosis not present

## 2021-07-06 DIAGNOSIS — R5383 Other fatigue: Secondary | ICD-10-CM

## 2021-07-06 DIAGNOSIS — R200 Anesthesia of skin: Secondary | ICD-10-CM | POA: Diagnosis not present

## 2021-07-06 DIAGNOSIS — F419 Anxiety disorder, unspecified: Secondary | ICD-10-CM

## 2021-07-06 MED ORDER — LORAZEPAM 1 MG PO TABS: 1.0000 mg | ORAL_TABLET | Freq: Every day | ORAL | 5 refills | Status: DC | PRN

## 2021-07-06 MED ORDER — MODAFINIL 200 MG PO TABS: 200.0000 mg | ORAL_TABLET | Freq: Every day | ORAL | 5 refills | Status: DC

## 2021-07-06 NOTE — Progress Notes (Signed)
[facility]  PATIENT: Jacqueline Simpson DOB: 01-13-1996  REFERRING DOCTOR OR PCP:  Dr. Gerilyn Nestle (Rheum); [facility] (PCP) SOURCE: patient, notes in EMR including recent hospitalization, Lab and MRI reports, MRI images personally reviewed..  _________________________________   HISTORICAL  CHIEF COMPLAINT:  Chief Complaint  Patient presents with   Follow-up    Rm 16, alone. Here to f/u for recent dizziness and brain fog. Onset a week ago. Pt is on her last day of her medrol and reports feeling better today. At the time pt had a HA on R side of head above the ear and back of head. HA are better as well.      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  Update 07/06/2021: Last month she had a relapse with diffuse joint pain.    She saw Rheum (Dr.Ziolkowska) and was prescribed a steroid pack with some improvement.   Of notem, the jont pain was preceded by a stomach virus.  She had more cognitive issues, headache and dizziness last week .  She was prescribed a steroid pack and feels it has helped (on lsat day now)   She is on Cimzia as her main DMT every 14 days   She has no recent episodes of facial numbness (or other numbness, weakness or clumsinss.   MRI in 2021 showed a new pontine lesion when she had new onset facial numbness.    This had been her first relapse in many years.  After the last exacerbation in 2021, her voice got softer and she continues to have some dysphagia.   The facial numbness is better   She occasionally gets mild mouth or groin sores for a week or so every month or two.      Of note she had other Behcet lesions in the past in the pons, including  Bell's palsy and dysphagia.      She increased the Cimzia to every 10- days in 2021 with the neuro relapse.  Due to insurance, safer a few months if the higher frequency, she went back to every 14 days.   She has had some some oral and genital abscesses and  sores.   In the past, she had been on Humira, Remicade and Methotrexate  She is very fatigued.   This has been a long-term problem but worsened last year.  She has had attention deficit since childhood but could not tolerate medications.    She is sleeping better and no longer takes trazodone.    She has anxiety.  Lorazepam helps.  Buspar made her feel weird and she stopped (also had not helped).    Neuro-Behcet's History: She was diagnosed with Neuro-Behcet's around age 26.    She actually had mouth ulcers, dizziness and vomiting episodes multiple times as a younger child and had optic neuritis at age 26.   She had an MRI after the ON and was originally diagnosed with MS and placed on MS medications.   After she also had genital ulcers and more oral ulcers a biopsy was performed consistent with vasculitis.    Since a relapse in 2011, she has had speech slurring.     She saw Dr. Normajean Baxter (Rheum) and Dr. Ebony Cargo (Neuro) in [place] around age 26 and was diagnosed with Neuro-Behcet's.    She had been on Humira (broke through with 2011 relapse) and Remicade (had possible allergic reaction) in the past.    She is on  Cimzia (anti-TNF) and is doing well for the most part x several years.   In 2021 she had an exacerbation due to a right pontine lesion and had facial numbness, weakness and dysphagia and increase in gait issues  IMAGING  The 02/12/2010 MRI showed an enhancing focus near the fourth ventricle in the left cerebellar hemisphere.  Additionally there were nonspecific white matter foci in the hemispheres and cerebellum.    The 03/28/2016 and6/13/ 2019 MRIs showed a stable pattern of nonspecific foci in the cerebral and cerebellar white matter.  The cerebellar lesion was smaller and no longer enhanced on those 2 more recent  MRIs.   Review of labs is unremarkable  The 10/29/2019 MRI showed a new enhancing lesion measuring 15 mm in the right pons/middle cerebellar peduncle c/w Behcet's   REVIEW OF  SYSTEMS: Constitutional: No fevers, chills, sweats, or change in appetite.  Has insomnia Eyes: No visual changes, double vision, eye pain Ear, nose and throat: No hearing loss, ear pain, nasal congestion, sore throat Cardiovascular: No chest pain, palpitations Respiratory:  No shortness of breath at rest or with exertion.   No wheezes GastrointestinaI: No nausea, vomiting, diarrhea, abdominal pain, fecal incontinence Genitourinary:  No dysuria, urinary retention or frequency.  No nocturia. Musculoskeletal:  No neck pain, back pain Integumentary: No rash, pruritus, skin lesions Neurological: as above Psychiatric: No depression at this time.  No anxiety Endocrine: No palpitations, diaphoresis, change in appetite, change in weigh or increased thirst Hematologic/Lymphatic:  No anemia, purpura, petechiae. Allergic/Immunologic: No itchy/runny eyes, nasal congestion, recent allergic reactions, rashes  ALLERGIES: Allergies  Allergen Reactions   Colchicine Shortness Of Breath   Milk-Related Compounds Other (See Comments)    GI UPSET   Sulfa Antibiotics Hives and Swelling    SWELLING LIPS   Adhesive [Tape] Other (See Comments)    SKIN IRRITATION   Soap Itching    Certain scented soaps    HOME MEDICATIONS:   PAST MEDICAL HISTORY: Past Medical History:  Diagnosis Date   Anal fistula 12/15/2020   Anxiety    Behcet's disease (HCC)    hx. brain lesions, mouth and skin ulcers   Depression    GERD (gastroesophageal reflux disease)    History of MRSA infection 2015   arms, legs   History of seizures as a child    due to Behcet's disease - no seizures since age 26   Joint pain    due to Behcet's syndrome   Laceration of wrist with tendon involvement 94/85/4627   self-inflicted   Mouth ulcer 12/15/2020   Perianal abscess 12/2019   Scoliosis 12/15/2020   Social anxiety disorder     PAST SURGICAL HISTORY: Past Surgical History:  Procedure Laterality Date   ARTERY AND TENDON  REPAIR Left 03/04/2014   Procedure: ARTERY AND TENDON REPAIR;  Surgeon: Leanora Cover, MD;  Location: [place];  Service: Orthopedics;  Laterality: Left;   COLONOSCOPY WITH ESOPHAGOGASTRODUODENOSCOPY (EGD)  03/11/2021   EXCISION OF SKIN TAG N/A 04/19/2021   Procedure: EXCISION OF ANAL TAG;  Surgeon: Ileana Roup, MD;  Location: [place];  Service: General;  Laterality: N/A;   LIGATION OF INTERNAL FISTULA TRACT N/A 04/19/2021   Procedure: LIGATION OF INTERNAL FISTULA TRACT;  Surgeon: Ileana Roup, MD;  Location: [place];  Service: General;  Laterality: N/A;   LUMBAR PUNCTURE  04/28/2003   under anesthesia   PLACEMENT OF SETON N/A 12/17/2020   Procedure: [place];  Surgeon: Ileana Roup, MD;  Location[facility];  Service: General;  Laterality: N/A;   RECTAL EXAM UNDER ANESTHESIA N/A 12/17/2020   Procedure: ANORECTAL EXAM UNDER ANESTHESIA;  Surgeon: Ileana Roup, MD;  Location: [place];  Service: General;  Laterality: N/A;   RECTAL EXAM UNDER ANESTHESIA N/A 04/19/2021   Procedure: ANAL RECTAL EXAM UNDER ANESTHESIA;  Surgeon: Ileana Roup, MD;  Location: [place];  Service: General;  Laterality: N/A;   WISDOM TOOTH EXTRACTION     WOUND EXPLORATION Left 03/04/2014   Procedure: LEFT WRIST EXPLORATION;  Surgeon: Leanora Cover, MD;  Location: [place];  Service: Orthopedics;  Laterality: Left;    FAMILY HISTORY: Family History  Problem Relation Age of Onset   Depression Mother    Graves' disease Mother    Irritable bowel syndrome Mother    ADD / ADHD Brother    OCD Brother    Bipolar disorder Maternal Uncle    Colon cancer Maternal Uncle 55   Crohn's disease Cousin    Lupus Cousin    Esophageal cancer Neg Hx    Rectal cancer Neg Hx    Stomach cancer Neg Hx     SOCIAL HISTORY:  Social History   Socioeconomic  History   Marital status: Single    Spouse name: Not on file   Number of children: 0   Years of education: Not on file   Highest education level: Not on file  Occupational History   Occupation: [employer]  Tobacco Use   Smoking status: Every Day    Types: E-cigarettes   Smokeless tobacco: Never   Tobacco comments:    use everyday  Vaping Use   Vaping Use: Every day   Substances: Nicotine, Flavoring  Substance and Sexual Activity   Alcohol use: Yes    Alcohol/week: 1.0 standard drink    Types: 1 Glasses of wine per week    Comment: occasionally 1 drink every few months   Drug use: Not Currently    Types: Marijuana    Comment: have not use in over 4 years   Sexual activity: Not Currently    Partners: Female, Female  Other Topics Concern   Not on file  Social History Narrative   Not on file   Social Determinants of Health   Financial Resource Strain: Not on file  Food Insecurity: Not on file  Transportation Needs: Not on file  Physical Activity: Not on file  Stress: Not on file  Social Connections: Not on file  Intimate Partner Violence: Not on file     PHYSICAL EXAM  Vitals:   07/06/21 0920  BP: 126/86  Pulse: (!) 102  Weight: 150 lb (68 kg)  Height: 4\' 11"  (1.499 m)    Body mass index is 30.3 kg/m.   General: The patient is well-developed and well-nourished and in no acute distress.  No sores noted in the oropharynx funduscopic examination was normal.  Neck: The neck is nontender.  Skin: Extremities are without significant edema.  Musculoskeletal:  Back is nontender  Neurologic Exam  Mental status: The patient is alert and oriented x 3 at the time of the examination. The patient has apparent normal recent and remote memory, with an apparently normal attention span and concentration ability.   Speech is normal.  Cranial nerves: Extraocular movements are full.  There is a mild right APD.  Color vision was reduced OD.  Visual acuity reduced OD.  She has  asymmetry of facial sensation, reduced on the right.  Reduced strength on right face..  Trapezius strength was normal.  The tongue is midline, and the patient has symmetric elevation of the soft palate. No obvious hearing deficits are noted.  Motor:  Muscle bulk is normal.   Tone is normal. Strength is  5 / 5 in all 4 extremities.   Sensory: Sensory testing is intact to pinprick, soft touch and vibration sensation in all 4 extremities.  Coordination: Cerebellar testing reveals good finger-nose-finger and heel-to-shin bilaterally.  Gait and station: Station is normal.   The gait ismildly.  Tandem gait is wide.  Romberg is negative.  Reflexes: Deep tendon reflexes are symmetric and normal bilaterally.   Plantar responses are flexor.    DIAGNOSTIC DATA (LABS, IMAGING, TESTING) - I reviewed patient records, labs, notes, testing and imaging myself where available.  Lab Results  Component Value Date   WBC 8.7 10/27/2017   HGB 11.9 (L) 10/27/2017   HCT 37.3 10/27/2017   MCV 84.8 10/27/2017   PLT 226 10/27/2017      Component Value Date/Time   NA 138 10/27/2017 0723   K 4.1 10/27/2017 0723   CL 108 10/27/2017 0723   CO2 20 (L) 10/27/2017 0723   GLUCOSE 91 10/27/2017 0723   BUN 9 10/27/2017 0723   CREATININE 0.53 10/27/2017 0723   CALCIUM 9.0 10/27/2017 0723   PROT 8.9 (H) 02/28/2014 1230   ALBUMIN 3.9 02/28/2014 1230   AST 18 02/28/2014 1230   ALT 14 02/28/2014 1230   ALKPHOS 46 02/28/2014 1230   BILITOT <0.2 (L) 02/28/2014 1230   GFRNONAA >60 10/27/2017 0723   GFRAA >60 10/27/2017 0723       ASSESSMENT AND PLAN  Behcet's disease ([place])  Other fatigue  H/O optic neuritis  Right facial numbness  Dysphagia, unspecified type  Anxiety  Social anxiety disorder - Plan: LORazepam (ATIVAN) 1 MG tablet  1.   Continue Cimzia.  For flares can use steroid packs.  If major neuroloic flare, we will check an MRI and may need change or addition in to the Cimzia.     2.  She is  disabled due to combination of physical (balance, gait, visual) and other impairments (fatigue, attention deficit, anxiety) linked to her Neuro-Behcet's disease 3.   Sleep is doing well now.  Continue to have a regular bedtime and try to get 7 to 8 hours of sleep. 4.   For anxiety she can take prn lorazepam.   No more than 1/day or 30/month 5.   Modafinil for fatigue.    6.   RTC one year if stable but call sooner if new or worsening neurologic issues.   40-minute office visit with the majority of the time spent face-to-face for history and physical, discussion/counseling and decision-making.  Additional time with record review and documentation.   Valene Villa A. [name] Shelling, MD, [facility] 6/83/4196, 22:29 AM Certified in Neurology, Clinical Neurophysiology, Sleep Medicine, Pain Medicine and Neuroimaging  [facility] [address], [place], [place] [address] [phone]

## 2021-08-24 ENCOUNTER — Ambulatory Visit: Payer: 59 | Admitting: Neurology

## 2021-08-28 ENCOUNTER — Other Ambulatory Visit: Payer: Self-pay | Admitting: Neurology

## 2021-08-28 DIAGNOSIS — F4010 Social phobia, unspecified: Secondary | ICD-10-CM

## 2022-01-03 ENCOUNTER — Other Ambulatory Visit: Payer: Self-pay | Admitting: Neurology

## 2022-01-03 DIAGNOSIS — F4010 Social phobia, unspecified: Secondary | ICD-10-CM

## 2022-01-03 MED ORDER — LORAZEPAM 1 MG PO TABS: 1.0000 mg | ORAL_TABLET | Freq: Every day | ORAL | 5 refills | Status: DC | PRN

## 2022-01-03 NOTE — Telephone Encounter (Signed)
This RX for [facility]epam [facility] [address]. Apparently, it is out of stock and patient is requesting the [facility] [address].  [place] Controlled Substance Registry appears to be appropriate.

## 2022-01-03 NOTE — Telephone Encounter (Signed)
Last OV was on 07/06/21.  Next OV is scheduled for 01/05/22.  Last RX was written on 11/27/21 for 30 tabs.   [place] Drug Database has been reviewed.

## 2022-01-03 NOTE — Telephone Encounter (Signed)
Pt request refill for LORazepam (ATIVAN) 1 MG tablet at [id number]

## 2022-01-03 NOTE — Telephone Encounter (Signed)
[facility] informed the [facility] LORazepam (ATIVAN) 1 MG tablet is out of stock.   Pt requesting refill be sent to: [facility] [address] [place], [place] [address] Ph: [phone]

## 2022-01-04 ENCOUNTER — Other Ambulatory Visit: Payer: Self-pay | Admitting: Neurology

## 2022-01-04 DIAGNOSIS — F4010 Social phobia, unspecified: Secondary | ICD-10-CM

## 2022-01-04 MED ORDER — LORAZEPAM 1 MG PO TABS: 1.0000 mg | ORAL_TABLET | Freq: Every day | ORAL | 5 refills | Status: DC | PRN

## 2022-01-04 NOTE — Telephone Encounter (Signed)
Pt is calling to see if the prescription was sent to the [facility] [address]. Pt stated she needed that prescription today because she is out of medication.

## 2022-01-04 NOTE — Telephone Encounter (Signed)
I have routed the request to Dr [name] Shelling as urgent for him to resend to the pharmacy being requested.

## 2022-01-04 NOTE — Telephone Encounter (Signed)
Pt father came into office to see if the medication LORazepam (ATIVAN) 1 MG tablet can be switched [facility] [address]. Pt father states he would like to pick up the medication today and after speaking with the pharmacy they have stated they need the Dr. Rexene Alberts okay for this.

## 2022-01-04 NOTE — Telephone Encounter (Signed)
Pt called wanting to know if her medication was switched to the [place] location and when will it be called in. Please call pt when done so that she can go pick it up.

## 2022-01-05 ENCOUNTER — Ambulatory Visit: Payer: 59 | Admitting: Neurology

## 2022-01-05 ENCOUNTER — Encounter: Payer: Self-pay | Admitting: Neurology

## 2022-01-05 VITALS — BP 132/91 | HR 94 | Ht 59.0 in | Wt 151.0 lb

## 2022-01-05 DIAGNOSIS — Z8669 Personal history of other diseases of the nervous system and sense organs: Secondary | ICD-10-CM

## 2022-01-05 DIAGNOSIS — M352 Behcet's disease: Secondary | ICD-10-CM

## 2022-01-05 DIAGNOSIS — F4010 Social phobia, unspecified: Secondary | ICD-10-CM

## 2022-01-05 NOTE — Progress Notes (Signed)
[facility]  PATIENT: Jacqueline Simpson DOB: 1996/04/10  REFERRING DOCTOR OR PCP:  Dr. Gerilyn Nestle (Rheum); [facility] (PCP) SOURCE: patient, notes in EMR including recent hospitalization, Lab and MRI reports, MRI images personally reviewed..  _________________________________   HISTORICAL  CHIEF COMPLAINT:  Chief Complaint  Patient presents with   Follow-up    Rm 1 with mom. She does report increase joint pain, anxiety, ulcers, and fatigue.     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  Update 01/05/2022: She has more joint pain legs/ankles and hands.   She also has more genital and oral ulcers.    She sees Rheum (Dr.Ziolkowska).   She last had a steroid pack for a joint pain flare-up about 8-9 months ago.    Joint pain was worse at that time.   She had a shorter steroid pack for a sinus infection a few months ago.    She is on Cimzia as her main DMT every 14 days   She has no recent episodes of facial numbness (or other numbness, weakness or clumsinss.   MRI in 2021 showed a new pontine lesion when she had new onset facial numbness.    This had been her first relapse in many years.  After the last exacerbation in 2021, her voice got softer and she continues to have some dysphagia.   The facial numbness is better   She occasionally gets mild mouth or groin sores for a week or so every month or two.      She had more cognitive issues, headache and dizziness last week .     She is having more anxiety.      Of note she had other Behcet lesions in the past in the pons, including  Bell's palsy and dysphagia.      She increased the Cimzia to every 10- days in 2021 with the neuro relapse.  Due to insurance, safer a few months if the higher frequency, she went back to every 14 days.   She has had some some oral and genital abscesses and sores.   In the past, she had been on Humira, Remicade and Methotrexate  She is very  fatigued.   This has been a long-term problem but worsened last year.  She has had attention deficit since childhood but could not tolerate medications.    She is sleeping better and no longer takes trazodone.    She has anxiety.  Lorazepam helps.  Buspar made her feel weird and she stopped (also had not helped).    Neuro-Behcet's History: She was diagnosed with Neuro-Behcet's around age 21.    She actually had mouth ulcers, dizziness and vomiting episodes multiple times as a younger child and had optic neuritis at age 33.   She had an MRI after the ON and was originally diagnosed with MS and placed on MS medications.   After she also had genital ulcers and more oral ulcers a biopsy was performed consistent with vasculitis.    Since a relapse in 2011, she has had speech slurring.     She saw Dr. Normajean Baxter (Rheum) and Dr. Ebony Cargo (Neuro) in [place] around age 40 and was diagnosed with Neuro-Behcet's.    She had been on Humira (broke through with 2011 relapse) and Remicade (had possible allergic reaction) in the past.    She is on Cimzia (anti-TNF) and is doing well for the most part x several years.  In 2021 she had an exacerbation due to a right pontine lesion and had facial numbness, weakness and dysphagia and increase in gait issues  IMAGING  The 02/12/2010 MRI showed an enhancing focus near the fourth ventricle in the left cerebellar hemisphere.  Additionally there were nonspecific white matter foci in the hemispheres and cerebellum.    The 03/28/2016 and6/13/ 2019 MRIs showed a stable pattern of nonspecific foci in the cerebral and cerebellar white matter.  The cerebellar lesion was smaller and no longer enhanced on those 2 more recent  MRIs.   Review of labs is unremarkable  The 10/29/2019 MRI showed a new enhancing lesion measuring 15 mm in the right pons/middle cerebellar peduncle c/w Behcet's   REVIEW OF SYSTEMS: Constitutional: No fevers, chills, sweats, or change in appetite.  Has insomnia Eyes:  No visual changes, double vision, eye pain Ear, nose and throat: No hearing loss, ear pain, nasal congestion, sore throat Cardiovascular: No chest pain, palpitations Respiratory:  No shortness of breath at rest or with exertion.   No wheezes GastrointestinaI: No nausea, vomiting, diarrhea, abdominal pain, fecal incontinence Genitourinary:  No dysuria, urinary retention or frequency.  No nocturia. Musculoskeletal:  No neck pain, back pain Integumentary: No rash, pruritus, skin lesions Neurological: as above Psychiatric: No depression at this time.  No anxiety Endocrine: No palpitations, diaphoresis, change in appetite, change in weigh or increased thirst Hematologic/Lymphatic:  No anemia, purpura, petechiae. Allergic/Immunologic: No itchy/runny eyes, nasal congestion, recent allergic reactions, rashes  ALLERGIES: Allergies  Allergen Reactions   Colchicine Shortness Of Breath   Milk-Related Compounds Other (See Comments)    GI UPSET   Sulfa Antibiotics Hives and Swelling    SWELLING LIPS   Adhesive [Tape] Other (See Comments)    SKIN IRRITATION   Soap Itching    Certain scented soaps    HOME MEDICATIONS:   PAST MEDICAL HISTORY: Past Medical History:  Diagnosis Date   Anal fistula 12/15/2020   Anxiety    Behcet's disease (HCC)    hx. brain lesions, mouth and skin ulcers   Depression    GERD (gastroesophageal reflux disease)    History of MRSA infection 2015   arms, legs   History of seizures as a child    due to Behcet's disease - no seizures since age 26   Joint pain    due to Behcet's syndrome   Laceration of wrist with tendon involvement 27/62/7035   self-inflicted   Mouth ulcer 12/15/2020   Perianal abscess 12/2019   Scoliosis 12/15/2020   Social anxiety disorder     PAST SURGICAL HISTORY: Past Surgical History:  Procedure Laterality Date   ARTERY AND TENDON REPAIR Left 03/04/2014   Procedure: ARTERY AND TENDON REPAIR;  Surgeon: Leanora Cover, MD;  Location:  [facility];  Service: Orthopedics;  Laterality: Left;   COLONOSCOPY WITH ESOPHAGOGASTRODUODENOSCOPY (EGD)  03/11/2021   EXCISION OF SKIN TAG N/A 04/19/2021   Procedure: EXCISION OF ANAL TAG;  Surgeon: Ileana Roup, MD;  Location: [facility];  Service: General;  Laterality: N/A;   LIGATION OF INTERNAL FISTULA TRACT N/A 04/19/2021   Procedure: LIGATION OF INTERNAL FISTULA TRACT;  Surgeon: Ileana Roup, MD;  Location: [facility];  Service: General;  Laterality: N/A;   LUMBAR PUNCTURE  04/28/2003   under anesthesia   PLACEMENT OF SETON N/A 12/17/2020   Procedure: PLACEMENT OF PERIANAL SETON;  Surgeon: Ileana Roup, MD;  Location: [facility];  Service: General;  Laterality: N/A;   RECTAL EXAM UNDER ANESTHESIA N/A 12/17/2020   Procedure: ANORECTAL EXAM UNDER ANESTHESIA;  Surgeon: Ileana Roup, MD;  Location: [facility];  Service: General;  Laterality: N/A;   RECTAL EXAM UNDER ANESTHESIA N/A 04/19/2021   Procedure: ANAL RECTAL EXAM UNDER ANESTHESIA;  Surgeon: Ileana Roup, MD;  Location: [facility];  Service: General;  Laterality: N/A;   WISDOM TOOTH EXTRACTION     WOUND EXPLORATION Left 03/04/2014   Procedure: LEFT WRIST EXPLORATION;  Surgeon: Leanora Cover, MD;  Location: [facility];  Service: Orthopedics;  Laterality: Left;    FAMILY HISTORY: Family History  Problem Relation Age of Onset   Depression Mother    Graves' disease Mother    Irritable bowel syndrome Mother    ADD / ADHD Brother    OCD Brother    Bipolar disorder Maternal Uncle    Colon cancer Maternal Uncle 57   Crohn's disease Cousin    Lupus Cousin    Esophageal cancer Neg Hx    Rectal cancer Neg Hx    Stomach cancer Neg Hx     SOCIAL HISTORY:  Social History   Socioeconomic History   Marital status: Single    Spouse name: Not on file   Number of children: 0   Years of  education: Not on file   Highest education level: Not on file  Occupational History   Occupation: [employer]  Tobacco Use   Smoking status: Every Day    Types: E-cigarettes   Smokeless tobacco: Never   Tobacco comments:    use everyday  Vaping Use   Vaping Use: Every day   Substances: Nicotine, Flavoring  Substance and Sexual Activity   Alcohol use: Yes    Alcohol/week: 1.0 standard drink of alcohol    Types: 1 Glasses of wine per week    Comment: occasionally 1 drink every few months   Drug use: Not Currently    Types: Marijuana    Comment: have not use in over 4 years   Sexual activity: Not Currently    Partners: Female, Female  Other Topics Concern   Not on file  Social History Narrative   Not on file   Social Determinants of Health   Financial Resource Strain: Not on file  Food Insecurity: Not on file  Transportation Needs: Not on file  Physical Activity: Not on file  Stress: Not on file  Social Connections: Not on file  Intimate Partner Violence: Not on file     PHYSICAL EXAM  Vitals:   01/05/22 1104  BP: (!) 132/91  Pulse: 94  Weight: 151 lb (68.5 kg)  Height: '4\' 11"'$  (1.499 m)    Body mass index is 30.5 kg/m.   General: The patient is well-developed and well-nourished and in no acute distress.  No sores noted in the oropharynx funduscopic examination was normal.  Neck: The neck is nontender.  Skin: Extremities are without significant edema.  Musculoskeletal:  Back is nontender  Neurologic Exam  Mental status: The patient is alert and oriented x 3 at the time of the examination. The patient has apparent normal recent and remote memory, with an apparently normal attention span and concentration ability.   Speech is normal.  Cranial nerves: Extraocular movements are full.  There is a mild right APD.  Color vision was reduced OD.  Visual acuity reduced OD.  She has asymmetry of facial sensation, reduced on the right.  Reduced strength on right  face..   Trapezius strength was normal.  The tongue is midline, and the patient has symmetric elevation of the soft palate. No obvious hearing deficits are noted.  Motor:  Muscle bulk is normal.   Tone is normal. Strength is  5 / 5 in all 4 extremities.   Sensory: Sensory testing is intact to pinprick, soft touch and vibration sensation in all 4 extremities.  Coordination: Cerebellar testing reveals good finger-nose-finger and heel-to-shin bilaterally.  Gait and station: Station is normal.   The gait ismildly.  Tandem gait is mildly wide.   Romberg is negative.  Reflexes: Deep tendon reflexes are symmetric and normal bilaterally.   Plantar responses are flexor.    DIAGNOSTIC DATA (LABS, IMAGING, TESTING) - I reviewed patient records, labs, notes, testing and imaging myself where available.  Lab Results  Component Value Date   WBC 8.7 10/27/2017   HGB 11.9 (L) 10/27/2017   HCT 37.3 10/27/2017   MCV 84.8 10/27/2017   PLT 226 10/27/2017      Component Value Date/Time   NA 138 10/27/2017 0723   K 4.1 10/27/2017 0723   CL 108 10/27/2017 0723   CO2 20 (L) 10/27/2017 0723   GLUCOSE 91 10/27/2017 0723   BUN 9 10/27/2017 0723   CREATININE 0.53 10/27/2017 0723   CALCIUM 9.0 10/27/2017 0723   PROT 8.9 (H) 02/28/2014 1230   ALBUMIN 3.9 02/28/2014 1230   AST 18 02/28/2014 1230   ALT 14 02/28/2014 1230   ALKPHOS 46 02/28/2014 1230   BILITOT <0.2 (L) 02/28/2014 1230   GFRNONAA >60 10/27/2017 0723   GFRAA >60 10/27/2017 0723       ASSESSMENT AND PLAN  Behcet's disease ([place]) - Plan: Ambulatory referral to [facility] anxiety disorder - Plan: Ambulatory referral to [facility]  H/O optic neuritis  1.   Continue Cimzia.  For flares can use steroid packs.  If major neuroloic flare, we will check an MRI and may need change or addition in to the Cimzia.     2.  She is disabled due to combination of physical (balance, gait, visual) and other impairments (fatigue,  attention deficit, anxiety) linked to her Neuro-Behcet's disease 3.   Sleep is doing well now with lorazepam most nights.  Continue to have a regular bedtime and try to get 7 to 8 hours of sleep. 4.   For anxiety she can take prn lorazepam.  I will refer her to behavioral health for counseling.  She felt a benefit doing this in the past..   No more than 1/day or 30/month.   We discussed trying Cymbalta and she will consider as it may help pain some as well.    SSRIs had not helped in past.   Could also consider Abilify if anxiety worsens.    5.   RTC 6 months if stable but call sooner if new or worsening neurologic issues.     Dawnell Bryant A. [name] Shelling, MD, [facility] 5/91/6384, 66:59 AM Certified in Neurology, Clinical Neurophysiology, Sleep Medicine, Pain Medicine and Neuroimaging  [facility] [address], [place], [place] [address] [phone]

## 2022-02-08 ENCOUNTER — Ambulatory Visit (INDEPENDENT_AMBULATORY_CARE_PROVIDER_SITE_OTHER): Payer: 59 | Admitting: Behavioral Health

## 2022-02-08 DIAGNOSIS — F411 Generalized anxiety disorder: Secondary | ICD-10-CM | POA: Diagnosis not present

## 2022-02-08 NOTE — Progress Notes (Signed)
[facility] [facility] Counselor Initial Adult Exam  Name: Jacqueline Simpson Date: 02/08/2022 MRN: [id number] DOB: 01/22/1996 PCP: [facility]  Time spent: 60 min [place] video; Pt is home in private & Provider is located @ [place]:  Father's Ins coverage    Paperwork requested: No   Reason for Visit /Presenting Problem: Social anx/dep & adjustment to health status changes that are unstable. Pt has been denied Disability in the past few wks & is upset for this.  Mental Status Exam: Appearance:   Casual     Behavior:  Appropriate and Sharing  Motor:  Normal  Speech/Language:   Clear and Coherent and Normal Rate  Affect:  Appropriate and sad  Mood:  anxious  Thought process:  normal  Thought content:    WNL  Sensory/Perceptual disturbances:    WNL  Orientation:  oriented to person, place, and time/date  Attention:  Good  Concentration:  Good  Memory:  Impaired w/the onset of anxiety  Fund of knowledge:   Good  Insight:    Good  Judgment:   Good  Impulse Control:  Good    Risk Assessment: Danger to Self:  No Self-injurious Behavior: No Danger to Others: No Duty to Warn:no Physical Aggression / Violence:No  Access to Firearms a concern: No  Gang Involvement:No  Patient / guardian was educated about steps to take if suicide or homicide risk level increases between visits: yes; Pt given [address] [facility] Crisis Hotline/Local Hotline access While future psychiatric events cannot be accurately predicted, the patient does not currently require acute inpatient psychiatric care and does not currently meet [facility] involuntary commitment criteria.  Substance Abuse History: Current substance abuse: No     Past Psychiatric History:   Pt has Hx of SA in 2014-2015 using razor blade on wrist; Pt called a best friend & his Mother called [place] @ [facility] History of Psych  Hospitalization: No ; Intensive OutPt Tx @ [facility] Psychological Testing:  NA    Abuse History:  Victim of: No.,  NA    Report needed: No. Victim of Neglect:No. Perpetrator of  NA   Witness / Exposure to Domestic Violence: No   Protective Services Involvement: No  Witness to [employer] Violence:  No   Family History:  Family History  Problem Relation Age of Onset   Depression Mother    Graves' disease Mother    Irritable bowel syndrome Mother    ADD / ADHD Brother    OCD Brother    Bipolar disorder Maternal Uncle    Colon cancer Maternal Uncle 62   Crohn's disease Cousin    Lupus Cousin    Esophageal cancer Neg Hx    Rectal cancer Neg Hx    Stomach cancer Neg Hx     Living situation: the patient lives with their partner  Sexual Orientation: Straight  Relationship Status: single  Name of spouse / other:Anthony is BF & Pt lives w/him If a parent, number of children / ages: NA  Support Systems: significant other friends parents  Museum/gallery curator Stress:  No   Income/Employment/Disability: 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 Service: No   Educational History: Education: some college  Religion/Sprituality/World View: Unk  Any cultural differences that may affect / interfere with treatment:  None noted  Recreation/Hobbies: Crochet, video games  Stressors: Health problems   Loss of Disability Claim   Mental  health issues w/social anxiety  Strengths: Supportive Relationships, Family, Self Advocate, Able to Communicate Effectively, and Resiliency  Barriers:  None noted; Pt is not new to psychotherapy & trusts the process   Legal History: Pending legal issue / charges: The patient has no significant history of legal issues. History of legal issue / charges:  NA  Medical History/Surgical History: reviewed Past Medical History:  Diagnosis Date   Anal fistula 12/15/2020   Anxiety    Behcet's disease ([place])    hx. brain lesions, mouth and  skin ulcers   Depression    GERD (gastroesophageal reflux disease)    History of MRSA infection 2015   arms, legs   History of seizures as a child    due to Behcet's disease - no seizures since age 34   Joint pain    due to Behcet's syndrome   Laceration of wrist with tendon involvement 70/62/3762   self-inflicted   Mouth ulcer 12/15/2020   Perianal abscess 12/2019   Scoliosis 12/15/2020   Social anxiety disorder     Past Surgical History:  Procedure Laterality Date   ARTERY AND TENDON REPAIR Left 03/04/2014   Procedure: ARTERY AND TENDON REPAIR;  Surgeon: Leanora Cover, MD;  Location: [facility];  Service: Orthopedics;  Laterality: Left;   COLONOSCOPY WITH ESOPHAGOGASTRODUODENOSCOPY (EGD)  03/11/2021   EXCISION OF SKIN TAG N/A 04/19/2021   Procedure: EXCISION OF ANAL TAG;  Surgeon: Ileana Roup, MD;  Location: [facility];  Service: General;  Laterality: N/A;   LIGATION OF INTERNAL FISTULA TRACT N/A 04/19/2021   Procedure: LIGATION OF INTERNAL FISTULA TRACT;  Surgeon: Ileana Roup, MD;  Location: [facility];  Service: General;  Laterality: N/A;   LUMBAR PUNCTURE  04/28/2003   under anesthesia   [place] N/A 12/17/2020   Procedure: PLACEMENT OF PERIANAL SETON;  Surgeon: Ileana Roup, MD;  Location: [facility];  Service: General;  Laterality: N/A;   RECTAL EXAM UNDER ANESTHESIA N/A 12/17/2020   Procedure: ANORECTAL EXAM UNDER ANESTHESIA;  Surgeon: Ileana Roup, MD;  Location: [facility];  Service: General;  Laterality: N/A;   RECTAL EXAM UNDER ANESTHESIA N/A 04/19/2021   Procedure: ANAL RECTAL EXAM UNDER ANESTHESIA;  Surgeon: Ileana Roup, MD;  Location: [facility];  Service: General;  Laterality: N/A;   WISDOM TOOTH EXTRACTION     WOUND EXPLORATION Left 03/04/2014   Procedure: LEFT WRIST EXPLORATION;  Surgeon: Leanora Cover, MD;  Location: [facility];  Service: Orthopedics;  Laterality: Left;    Medications: Current Outpatient Medications  Medication Sig Dispense Refill   Certolizumab Pegol (CIMZIA PREFILLED) 2 X 200 MG/ML KIT Inject 200 mg into the skin See admin instructions. Every 10 days (Patient taking differently: Inject 200 mg into the skin every 14 (fourteen) days.) 1 kit    EPINEPHrine (EPIPEN JR) 0.15 MG/0.3ML injection Inject 0.15 mg into the muscle as needed for anaphylaxis. Does not have one     ibuprofen (ADVIL) 200 MG tablet Take 400 mg by mouth every 6 (six) hours as needed.     LORazepam (ATIVAN) 1 MG tablet Take 1 tablet (1 mg total) by mouth daily as needed. for anxiety 30 tablet 5   methylPREDNISolone (MEDROL) 4 MG tablet Taper from 6 pills po for one day to 1 pill po the last day over 6 days 21 tablet 0   modafinil (PROVIGIL) 200 MG tablet Take 1 tablet (  200 mg total) by mouth daily. 30 tablet 5   No current facility-administered medications for this visit.    Allergies  Allergen Reactions   Colchicine Shortness Of Breath   Milk-Related Compounds Other (See Comments)    GI UPSET   Sulfa Antibiotics Hives and Swelling    SWELLING LIPS   Adhesive [Tape] Other (See Comments)    SKIN IRRITATION   Soap Itching    Certain scented soaps    Diagnoses:  GAD (generalized anxiety disorder)  Plan of Care: Ninnie sts she knows she needs psychotherapy again as it has helped her in the past. She has high social anxiety, does not drive & recently exp'd a panic attack. Pt will use the coping skills suggested today & report back next session on effectiveness.  Target Date: 02/21/2022 Progress: 0 Frequency: Twice Monthly Modality: Intake/Assessment today; Individual Therapy     Donnetta Hutching, LMFT

## 2022-02-08 NOTE — Progress Notes (Signed)
                Aleshia Cartelli L Dyrell Tuccillo, LMFT 

## 2022-02-21 ENCOUNTER — Ambulatory Visit: Payer: 59 | Admitting: Behavioral Health

## 2022-02-22 ENCOUNTER — Ambulatory Visit (INDEPENDENT_AMBULATORY_CARE_PROVIDER_SITE_OTHER): Payer: 59 | Admitting: Behavioral Health

## 2022-02-22 DIAGNOSIS — F411 Generalized anxiety disorder: Secondary | ICD-10-CM | POA: Diagnosis not present

## 2022-02-22 NOTE — Progress Notes (Addendum)
[facility] [facility] Counselor/Therapist Progress Note  Patient ID: Jacqueline Simpson, MRN: [id number],    Date: 02/28/2022  Time Spent: 50 min [place] video; Pt is home in private & Provider @ [place] Office   Treatment Type: Individual Therapy  Reported Symptoms: elevated anx/dep Sx with some reduction in panic since first psychotherapy visit  Mental Status Exam: Appearance:  Casual     Behavior: Appropriate and Sharing  Motor: Normal  Speech/Language:  Clear and Coherent and Normal Rate  Affect: Appropriate  Mood: anxious  Thought process: normal  Thought content:   WNL  Sensory/Perceptual disturbances:   WNL  Orientation: oriented to person, place, and time/date  Attention: Good  Concentration: Good  Memory: WNL  Fund of knowledge:  Good  Insight:   Good  Judgment:  Good  Impulse Control: Good   Risk Assessment: Danger to Self:  No Self-injurious Behavior: No Danger to Others: No Duty to Warn:no Physical Aggression / Violence:No  Access to Firearms a concern: No  Gang Involvement:No   Subjective: Pt has been anxious due to worrying about her Disability Claim which has been an ongoing effort since 2017. She now has a Disability Atty & he has worked on it for one year. The Atty is hopeful they can readjust her ppw to have the Claim be endorsed.    Interventions: Solution-Oriented/Positive Psychology and Psycho-education/Bibliotherapy  Diagnosis:GAD (generalized anxiety disorder)  Plan: Jacqueline Simpson is upset due to the lack of obtaining her Disability Claim. Jacqueline Simpson disease is rare & she does not see a great deal online in the [facility] pages that support her efforts. [name]  Target Date: 04/24/2022  Progress: 2  Frequency: Twice monthly  Modality: Jacqueline Simpson is worried for [name] finding approval of her in relationship. Visits to BF's Parents come w/lots of anxiety. His Mother does not accept  [facility]. She prevents [place] from talking & BF has tried to include Pt. These interactions produce a great deal of anixety in general. Pt will speak more w/BF's Bros & GF about this issue to understand more completely.  Target Date: 04/24/2022  Progress: 3  Frequency: Twice monthly  Modality: Jacqueline Reaper, LMFT

## 2022-02-22 NOTE — Progress Notes (Signed)
                Jestina Stephani L Tayana Shankle, LMFT 

## 2022-03-08 ENCOUNTER — Ambulatory Visit: Payer: 59 | Admitting: Behavioral Health

## 2022-03-22 ENCOUNTER — Ambulatory Visit: Payer: 59 | Admitting: Behavioral Health

## 2022-03-22 DIAGNOSIS — F411 Generalized anxiety disorder: Secondary | ICD-10-CM | POA: Diagnosis not present

## 2022-03-22 NOTE — Progress Notes (Signed)
[facility] Counselor/Therapist Progress Note  Patient ID: Jacqueline Simpson, MRN: [id number],    Date: 03/22/2022  Time Spent: 30 min Caregility video; Pt is in [place] @ home & Provider @ [place] Office   Treatment Type: Individual Therapy  Reported Symptoms: Elevated anx/dep due to her [name] acting odd towards Pt.   Mental Status Exam: Appearance:  Casual     Behavior: Appropriate and Sharing  Motor: Normal  Speech/Language:  Clear and Coherent  Affect: Appropriate  Mood: normal  Thought process: normal  Thought content:   WNL  Sensory/Perceptual disturbances:   WNL  Orientation: oriented to person, place, and time/date  Attention: Good  Concentration: Good  Memory: WNL  Fund of knowledge:  Good  Insight:   Good  Judgment:  Good  Impulse Control: Good   Risk Assessment: Danger to Self:  No Self-injurious Behavior: No Danger to Others: No Duty to Warn:no Physical Aggression / Violence:No  Access to Firearms a concern: No  Gang Involvement:No   Subjective: Pt is worried for her [name] & Str coming to their Apt to visit their 2 new cats. She has delayed this due to her anxiety.    Interventions: Family Systems  Diagnosis:GAD (generalized anxiety disorder)  Plan: Jacqueline Simpson is upset over her [name] & Str wanting to visit. She has been sick & does not feel up to a visit this week. Pt will use the tools suggested today for anxiety as the visit date approaches.   Target Date: 04/21/2022  Progress: 0  Frequency: Twice monthly  Modality: Voncille Simm wants the visit to go better than their last encounter. She is prepared to allow the visit & will set boundaries that do not include their dog via her BF Elberta Fortis. BF will also keep the visit short.   Target Date: 04/21/2022  Progress: 0  Frequency: Twice monthly   Modality: [name], LMFT

## 2022-03-22 NOTE — Progress Notes (Signed)
                Lenox Bink L Eamonn Sermeno, LMFT 

## 2022-04-12 ENCOUNTER — Encounter: Payer: Self-pay | Admitting: Neurology

## 2022-04-12 ENCOUNTER — Ambulatory Visit (INDEPENDENT_AMBULATORY_CARE_PROVIDER_SITE_OTHER): Payer: 59 | Admitting: Behavioral Health

## 2022-04-12 DIAGNOSIS — F411 Generalized anxiety disorder: Secondary | ICD-10-CM | POA: Diagnosis not present

## 2022-04-12 NOTE — Progress Notes (Signed)
                Montzerrat Brunell L Manila Rommel, LMFT 

## 2022-04-12 NOTE — Progress Notes (Signed)
[place] Counselor/Therapist Progress Note  Patient ID: Jacqueline Simpson, MRN: [id number],    Date: 04/12/2022  Time Spent: 76 min Caregililty video; Pt is home in private & Provider @ [facility]   Treatment Type: Individual Therapy  Reported Symptoms: elevated anx/dep & irritation due to upcoming visit over the wknd  Mental Status Exam: Appearance:  Casual     Behavior: Appropriate and Sharing  Motor: Normal  Speech/Language:  Clear and Coherent  Affect: Appropriate  Mood: normal  Thought process: normal  Thought content:   WNL  Sensory/Perceptual disturbances:   WNL  Orientation: oriented to person, place, and time/date  Attention: Good  Concentration: Good  Memory: WNL  Fund of knowledge:  Good  Insight:   Good  Judgment:  Good  Impulse Control: Good   Risk Assessment: Danger to Self:  No Self-injurious Behavior: No Danger to Others: No Duty to Warn:no Physical Aggression / Violence:No  Access to Firearms a concern: No  Gang Involvement:No   Subjective: Pt is annoyed about her BF's Parents coming over this wknd. She does not want to entertain them or speak to them much. She is irritated her BF wants them to have them over. They are not nice to her & sometimes are even rude. Pt believes they change her BF's perspective on her.   Interventions: Family Systems  Diagnosis:GAD (generalized anxiety disorder)  Plan: Jacqueline Simpson is worried for her BF's Family coming to visit. She will explore the Family's Hx w/him. She will try to ask BF's Family questions about his childhood.  Target Date: 05/12/2022  Progress: 3  Frequency: Twice monthly  Modality: Indiv BF Jacqueline Simpson is not worried for the visit. He says, "just be yourself". Jacqueline Simpson is very anxious for them not speaking to her. This will make it awkward. She will try to stretch herself so she can relax for the visit.  Target Date: 05/12/2022  Progress: 3  Frequency: Twice monthly  Modality:  Jacqueline Simpson will go on MyChart to ask for her Ativan script be raised. She will make this request after our visit today.  Target Date: 05/12/2022  Progress: 0  Frequency: Twice monthly  Modality: Jacqueline Reaper, LMFT

## 2022-04-13 ENCOUNTER — Other Ambulatory Visit: Payer: Self-pay | Admitting: *Deleted

## 2022-04-13 DIAGNOSIS — F4010 Social phobia, unspecified: Secondary | ICD-10-CM

## 2022-04-13 MED ORDER — LORAZEPAM 1 MG PO TABS: ORAL_TABLET | ORAL | 5 refills | Status: DC

## 2022-04-27 ENCOUNTER — Ambulatory Visit (INDEPENDENT_AMBULATORY_CARE_PROVIDER_SITE_OTHER): Payer: 59 | Admitting: Behavioral Health

## 2022-04-27 DIAGNOSIS — F411 Generalized anxiety disorder: Secondary | ICD-10-CM | POA: Diagnosis not present

## 2022-04-27 NOTE — Progress Notes (Signed)
                Jacqueline Simpson L Deara Bober, LMFT 

## 2022-04-27 NOTE — Progress Notes (Signed)
[facility] Counselor/Therapist Progress Note  Patient ID: Jacqueline Simpson, MRN: [id number],    Date: 04/27/2022  Time Spent: 77 min Caregility video; Pt is home in private & Provider @ [facility]   Treatment Type: Individual Therapy  Reported Symptoms: Reduced anx/dep since her BF's Parents visit  Mental Status Exam: Appearance:  Casual     Behavior: Appropriate and Sharing  Motor: Normal  Speech/Language:  Clear and Coherent  Affect: Appropriate  Mood: normal  Thought process: normal  Thought content:   WNL  Sensory/Perceptual disturbances:   WNL  Orientation: oriented to person, place, and time/date  Attention: Good  Concentration: Good  Memory: WNL  Fund of knowledge:  Good  Insight:   Good  Judgment:  Good  Impulse Control: Good   Risk Assessment: Danger to Self:  No Self-injurious Behavior: No Danger to Others: No Duty to Warn:no Physical Aggression / Violence:No  Access to Firearms a concern: No  Gang Involvement:No   Subjective: Pt has gotten sick for the second time in the past week. 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 She is angry her BF's Mom & Str came over to visit when the Str was sick. Pt does not want to be exposed to anyone sick.   Currently, she lives w/her BF independently. His Parents cannot control his movements any longer. BF is Forensic psychologist & does not live under their roof any longer.   Interventions: Family Systems  Diagnosis:GAD (generalized anxiety disorder)  Plan: Fardowsa is upset w/her BF's Parents treatment of her. They seem to object to her & her Parents. This is their pattern when he is in relationship w/any Sig Other. Reynalda has given them many chances, but they push her boundaries constantly. She will maintain her boundaries & realize she cannot change his Family.  Target Date: 05/30/2022  Progress: 5  Frequency: Twice monthly  Modality: Jaelyn Bourgoin will rely on her BF's GF @ the Christmas  Gathering since they are on the same page about Anthony's Parents.  Target Date: 05/30/2022  Progress: 7  Frequency: Twice monthly  Modality: [name], LMFT

## 2022-05-13 ENCOUNTER — Ambulatory Visit (INDEPENDENT_AMBULATORY_CARE_PROVIDER_SITE_OTHER): Payer: 59 | Admitting: Behavioral Health

## 2022-05-13 DIAGNOSIS — F411 Generalized anxiety disorder: Secondary | ICD-10-CM

## 2022-05-13 NOTE — Progress Notes (Signed)
[place] Counselor/Therapist Progress Note  Patient ID: Jacqueline Simpson, MRN: [id number],    Date: 05/13/2022  Time Spent: 44 min Caregility video; Pt @ home in private & Provider working remote from [employer]   Treatment Type: Individual Therapy  Reported Symptoms: Inc'd anx/dep & stress due to activity over the Holidays spending a great deal of time w/Family.  Mental Status Exam: Appearance:  Casual     Behavior: Appropriate and Sharing  Motor: Normal  Speech/Language:  Clear and Coherent  Affect: Appropriate  Mood: anxious  Thought process: normal  Thought content:   WNL  Sensory/Perceptual disturbances:   WNL  Orientation: oriented to person, place, and time/date  Attention: Good  Concentration: Good  Memory: WNL  Fund of knowledge:  Good  Insight:   Good  Judgment:  Good  Impulse Control: Good   Risk Assessment: Danger to Self:  No Self-injurious Behavior: No Danger to Others: No Duty to Warn:no Physical Aggression / Violence:No  Access to Firearms a concern: No  Gang Involvement:No   Subjective: Pt has been inundated over [employer] w/food that is rich, eating out when she doesn't do this much & lots of time w/Family interacting that has been draining to her Introversion tendencies.  Pt feels there is a bias towards her by her BF of 5 1/2 yrs. His younger Bros by 3 yrs has a GF of 1 yr & they have been allowed to stay in same Bedrm. It is a continuous feeling of being less than, feeling less than & not being given sensitive Tx by his Family.    Interventions: Ego-Supportive and Family Systems  Diagnosis:GAD (generalized anxiety disorder)  Plan: Jacqueline Simpson is having mixed feelings about her BF's Family & how they treat her when tgthr. She will work to try & accept the differences she cannot change. BF is more supportive of her wishes to leave gatherings early or when she does not want to attend. She will try to keep this fluid through the  [place].  Target Date: 06/13/2022  Progress: 5  Frequency: Twice monthly  Modality: [name], LMFT

## 2022-05-13 NOTE — Progress Notes (Signed)
                Hoyte Ziebell L Indigo Barbian, LMFT 

## 2022-05-27 ENCOUNTER — Ambulatory Visit (INDEPENDENT_AMBULATORY_CARE_PROVIDER_SITE_OTHER): Payer: 59 | Admitting: Behavioral Health

## 2022-05-27 DIAGNOSIS — F411 Generalized anxiety disorder: Secondary | ICD-10-CM | POA: Diagnosis not present

## 2022-05-27 NOTE — Progress Notes (Signed)
[place] Counselor/Therapist Progress Note  Patient ID: Jacqueline Simpson, MRN: [id number],    Date: 05/27/2022  Time Spent: 63 min Caregility video; Pt is home in private & Provider @ home working remote   Treatment Type: Individual Therapy  Reported Symptoms: Pt is grateful the Holidays are over. She had difficulty eating out a great deal & transitioning into & out of situations w/Family & BF's Family.  Mental Status Exam: Appearance:  Casual     Behavior: Appropriate and Sharing  Motor: Normal  Speech/Language:  Clear and Coherent and Normal Rate  Affect: Appropriate  Mood: normal  Thought process: normal  Thought content:   WNL  Sensory/Perceptual disturbances:   WNL  Orientation: oriented to person, place, and time/date  Attention: Good  Concentration: Good  Memory: WNL  Fund of knowledge:  Good  Insight:   Good  Judgment:  Good  Impulse Control: Good   Risk Assessment: Danger to Self:  No Self-injurious Behavior: No Danger to Others: No Duty to Warn:no Physical Aggression / Violence:No  Access to Firearms a concern: No  Gang Involvement:No   Subjective: Pt is somewhat anxious today. She feels the Holidays went relatively well. Today we revised her goals from Tx of family issues to her direct need for anx-related issues w/her health. She wants to reduce her anxiety in general & will try tips & suggestions in session.    Interventions: Solution-Oriented/Positive Psychology  Diagnosis:GAD (generalized anxiety disorder)  Plan: Peytyn will try to tame her level of anxiety for being in public. She will keep herself safe & also expose herself to safe situations w/her BF Anthony's help.  Target Date: 06/27/2022  Progress: 0  Frequency: Twice  monthly  Modality: Audreanna Torrisi will pay closer attn to her anxiety so she can name it & take control over it in ways that benefit her. Her BF will also participate @ her comfort level to accomplish this  endeavor.  Target Date: 06/27/2022  Progress: 2  Frequency: Twice monthly  Modality: [name], LMFT

## 2022-05-27 NOTE — Progress Notes (Signed)
                Dustin Bumbaugh L Kilani Joffe, LMFT 

## 2022-06-09 ENCOUNTER — Ambulatory Visit: Payer: 59 | Admitting: Behavioral Health

## 2022-06-09 DIAGNOSIS — F411 Generalized anxiety disorder: Secondary | ICD-10-CM | POA: Diagnosis not present

## 2022-06-09 NOTE — Progress Notes (Signed)
                Annalysia Willenbring L Lakitha Gordy, LMFT 

## 2022-06-09 NOTE — Progress Notes (Signed)
[facility] Counselor/Therapist Progress Note  Patient ID: Jacqueline Simpson, MRN: [id number],    Date: 06/09/2022  Time Spent: 38 min Caregility video; Pt is home in private & Provider working from [employer]   Treatment Type: Individual Therapy  Reported Symptoms: Inc'd anx/dep & stress due to the events of the past Pt wants to process now.  Mental Status Exam: Appearance:  Casual     Behavior: Appropriate and Sharing  Motor: Normal  Speech/Language:  Clear and Coherent  Affect: Appropriate  Mood: normal  Thought process: normal  Thought content:   WNL  Sensory/Perceptual disturbances:   WNL  Orientation: oriented to person, place, and time/date  Attention: Good  Concentration: Good  Memory: WNL  Fund of knowledge:  Good  Insight:   Good  Judgment:  Good  Impulse Control: Good   Risk Assessment: Danger to Self:  No Self-injurious Behavior: No Danger to Others: No Duty to Warn:no Physical Aggression / Violence:No  Access to Firearms a concern: No  Gang Involvement:No   Subjective: Pt has been denied SS Disability for the third attempt. She is navigating this w/the Atty. Pt needs to tend to her own personal Mtl Hlth needs.   Interventions: Solution-Oriented/Positive Psychology  Diagnosis:GAD (generalized anxiety disorder)  Plan: Lakeshia is c/o her own personal struggle to thrive. She will keep her Notebook entries so we can review this ea session.  Target Date: 07/10/2022  Progress: 2  Frequency: Twice monthly  Modality: [name], LMFT

## 2022-06-12 ENCOUNTER — Other Ambulatory Visit: Payer: Self-pay

## 2022-06-12 ENCOUNTER — Emergency Department (HOSPITAL_BASED_OUTPATIENT_CLINIC_OR_DEPARTMENT_OTHER): Payer: 59

## 2022-06-12 ENCOUNTER — Emergency Department (HOSPITAL_BASED_OUTPATIENT_CLINIC_OR_DEPARTMENT_OTHER)
Admission: EM | Admit: 2022-06-12 | Discharge: 2022-06-12 | Disposition: A | Discharge status: Home / Self Care | Payer: 59 | Attending: Emergency Medicine | Admitting: Emergency Medicine

## 2022-06-12 DIAGNOSIS — N6331 Unspecified lump in axillary tail of the right breast: Secondary | ICD-10-CM | POA: Insufficient documentation

## 2022-06-12 DIAGNOSIS — R591 Generalized enlarged lymph nodes: Secondary | ICD-10-CM

## 2022-06-12 LAB — CBC WITH DIFFERENTIAL/PLATELET
Abs Immature Granulocytes: 0.03 10*3/uL (ref 0.00–0.07)
Basophils Absolute: 0 10*3/uL (ref 0.0–0.1)
Basophils Relative: 0 %
Eosinophils Absolute: 0.2 10*3/uL (ref 0.0–0.5)
Eosinophils Relative: 2 %
HCT: 37.3 % (ref 36.0–46.0)
Hemoglobin: 11.5 g/dL — ABNORMAL LOW (ref 12.0–15.0)
Immature Granulocytes: 0 %
Lymphocytes Relative: 22 %
Lymphs Abs: 2 10*3/uL (ref 0.7–4.0)
MCH: 22.7 pg — ABNORMAL LOW (ref 26.0–34.0)
MCHC: 30.8 g/dL (ref 30.0–36.0)
MCV: 73.7 fL — ABNORMAL LOW (ref 80.0–100.0)
Monocytes Absolute: 1 10*3/uL (ref 0.1–1.0)
Monocytes Relative: 11 %
Neutro Abs: 5.8 10*3/uL (ref 1.7–7.7)
Neutrophils Relative %: 65 %
Platelets: 261 10*3/uL (ref 150–400)
RBC: 5.06 MIL/uL (ref 3.87–5.11)
RDW: 14.4 % (ref 11.5–15.5)
WBC: 9 10*3/uL (ref 4.0–10.5)
nRBC: 0 % (ref 0.0–0.2)

## 2022-06-12 LAB — COMPREHENSIVE METABOLIC PANEL
ALT: 12 U/L (ref 0–44)
AST: 10 U/L — ABNORMAL LOW (ref 15–41)
Albumin: 4.2 g/dL (ref 3.5–5.0)
Alkaline Phosphatase: 80 U/L (ref 38–126)
Anion gap: 11 (ref 5–15)
BUN: 9 mg/dL (ref 6–20)
CO2: 24 mmol/L (ref 22–32)
Calcium: 9.6 mg/dL (ref 8.9–10.3)
Chloride: 100 mmol/L (ref 98–111)
Creatinine, Ser: 0.55 mg/dL (ref 0.44–1.00)
GFR, Estimated: >60 mL/min (ref >60)
Glucose, Bld: 100 mg/dL — ABNORMAL HIGH (ref 70–99)
Potassium: 3.6 mmol/L (ref 3.5–5.1)
Sodium: 135 mmol/L (ref 135–145)
Total Bilirubin: 0.3 mg/dL (ref 0.3–1.2)
Total Protein: 8.8 g/dL — ABNORMAL HIGH (ref 6.5–8.1)

## 2022-06-12 LAB — PREGNANCY, URINE: Preg Test, Ur: NEGATIVE

## 2022-06-12 LAB — LACTIC ACID, PLASMA: Lactic Acid, Venous: 1.3 mmol/L (ref 0.5–1.9)

## 2022-06-12 MED ORDER — AMOXICILLIN-POT CLAVULANATE 875-125 MG PO TABS
1.0000 | ORAL_TABLET | Freq: Two times a day (BID) | ORAL | 0 refills | Status: AC
Start: 2022-06-12 — End: 2022-06-22

## 2022-06-12 NOTE — ED Triage Notes (Signed)
Patient arrives with complaints of worsening abscess under her right arm x1 week. Patient is currently being treated with antibiotics, however, she was recommended to come her if her symptoms worsened. Patient reports low grade fever at home.  She also is on medication that suppresses her immune system.

## 2022-06-12 NOTE — ED Provider Notes (Signed)
[facility] Provider Note   CSN: [id number] Arrival date & time: 06/12/22  1659     History  Chief Complaint  Patient presents with   Abscess    Jacqueline Simpson is a 27 y.o. female with a past medical history of Behcet's disease, GERD, perianal abscess, scoliosis presenting today for evaluation of a lump/mass under her right axilla.  Patient states she noticed a growing mass under her right axilla about a week ago.  Patient was evaluated at an urgent care, was was given antibiotics however she states the mass has increased in size and has become more painful.  Patient reports the pain is constant and worse with movement of her right shoulder.  She denies any fluid or pus drainage from the lump/mass.  Denies any fever, nausea, vomiting.   Abscess   Past Medical History:  Diagnosis Date   Anal fistula 12/15/2020   Anxiety    Behcet's disease ([place])    hx. brain lesions, mouth and skin ulcers   Depression    GERD (gastroesophageal reflux disease)    History of MRSA infection 2015   arms, legs   History of seizures as a child    due to Behcet's disease - no seizures since age 16   Joint pain    due to Behcet's syndrome   Laceration of wrist with tendon involvement 33/82/5053   self-inflicted   Mouth ulcer 12/15/2020   Perianal abscess 12/2019   Scoliosis 12/15/2020   Social anxiety disorder    Past Surgical History:  Procedure Laterality Date   ARTERY AND TENDON REPAIR Left 03/04/2014   Procedure: ARTERY AND TENDON REPAIR;  Surgeon: Leanora Cover, MD;  Location: [facility];  Service: Orthopedics;  Laterality: Left;   COLONOSCOPY WITH ESOPHAGOGASTRODUODENOSCOPY (EGD)  03/11/2021   EXCISION OF SKIN TAG N/A 04/19/2021   Procedure: EXCISION OF ANAL TAG;  Surgeon: Ileana Roup, MD;  Location: [facility];  Service: General;  Laterality: N/A;   LIGATION OF INTERNAL FISTULA TRACT N/A 04/19/2021    Procedure: LIGATION OF INTERNAL FISTULA TRACT;  Surgeon: Ileana Roup, MD;  Location: [facility];  Service: General;  Laterality: N/A;   LUMBAR PUNCTURE  04/28/2003   under anesthesia   [place] N/A 12/17/2020   Procedure: PLACEMENT OF PERIANAL SETON;  Surgeon: Ileana Roup, MD;  Location: [facility];  Service: General;  Laterality: N/A;   RECTAL EXAM UNDER ANESTHESIA N/A 12/17/2020   Procedure: ANORECTAL EXAM UNDER ANESTHESIA;  Surgeon: Ileana Roup, MD;  Location: [facility];  Service: General;  Laterality: N/A;   RECTAL EXAM UNDER ANESTHESIA N/A 04/19/2021   Procedure: ANAL RECTAL EXAM UNDER ANESTHESIA;  Surgeon: Ileana Roup, MD;  Location: [facility];  Service: General;  Laterality: N/A;   WISDOM TOOTH EXTRACTION     WOUND EXPLORATION Left 03/04/2014   Procedure: LEFT WRIST EXPLORATION;  Surgeon: Leanora Cover, MD;  Location: [facility];  Service: Orthopedics;  Laterality: Left;     Home Medications Prior to Admission medications   Medication Sig Start Date End Date Taking? Authorizing Provider  amoxicillin-clavulanate (AUGMENTIN) 875-125 MG tablet Take 1 tablet by mouth 2 (two) times daily for 10 days. 06/12/22 06/22/22 Yes Rex Kras, PA  Certolizumab Pegol (CIMZIA PREFILLED) 2 X 200 MG/ML KIT Inject 200 mg into the skin See admin instructions. Every 10 days Patient taking differently: Inject 200 mg  into the skin every 14 (fourteen) days. 03/01/14   Patrecia Pour, NP  EPINEPHrine (EPIPEN JR) 0.15 MG/0.3ML injection Inject 0.15 mg into the muscle as needed for anaphylaxis. Does not have one    [provider]  ibuprofen (ADVIL) 200 MG tablet Take 400 mg by mouth every 6 (six) hours as needed.    [provider]  LORazepam (ATIVAN) 1 MG tablet Take 1 tablet once or twice daily 04/13/22   Sater, Nanine Means, MD  methylPREDNISolone (MEDROL) 4 MG tablet Taper  from 6 pills po for one day to 1 pill po the last day over 6 days 07/01/21   Sater, Nanine Means, MD  modafinil (PROVIGIL) 200 MG tablet Take 1 tablet (200 mg total) by mouth daily. 07/06/21   Sater, Nanine Means, MD      Allergies    Colchicine, Milk-related compounds, Sulfa antibiotics, Adhesive [tape], and Soap    Review of Systems   Review of Systems Negative except as per HPI.  Physical Exam Updated Vital Signs BP (!) 120/90 (BP Location: Right Arm)   Pulse (!) 117   Temp 99.2 F (37.3 C) (Oral)   Resp 20   Ht '4\' 11"'$  (1.499 m)   Wt 70.3 kg   SpO2 100%   BMI 31.31 kg/m  Physical Exam Vitals and nursing note reviewed.  Constitutional:      Appearance: Normal appearance.  HENT:     Head: Normocephalic and atraumatic.     Mouth/Throat:     Mouth: Mucous membranes are moist.  Eyes:     General: No scleral icterus. Cardiovascular:     Rate and Rhythm: Normal rate and regular rhythm.     Pulses: Normal pulses.     Heart sounds: Normal heart sounds.  Pulmonary:     Effort: Pulmonary effort is normal.     Breath sounds: Normal breath sounds.  Abdominal:     General: Abdomen is flat.     Palpations: Abdomen is soft.     Tenderness: There is no abdominal tenderness.  Musculoskeletal:        General: No deformity.     Comments: 2 x 2 cm palpable mass under the right axilla with no overlying skin redness.  No fluid or pus drainage noted.  Skin:    General: Skin is warm.     Findings: No rash.  Neurological:     General: No focal deficit present.     Mental Status: She is alert.  Psychiatric:        Mood and Affect: Mood normal.     ED Results / Procedures / Treatments   Labs (all labs ordered are listed, but only abnormal results are displayed) Labs Reviewed  COMPREHENSIVE METABOLIC PANEL - Abnormal; Notable for the following components:      Result Value   Glucose, Bld 100 (*)    Total Protein 8.8 (*)    AST 10 (*)    All other components within normal limits  CBC  WITH DIFFERENTIAL/PLATELET - Abnormal; Notable for the following components:   Hemoglobin 11.5 (*)    MCV 73.7 (*)    MCH 22.7 (*)    All other components within normal limits  LACTIC ACID, PLASMA  PREGNANCY, URINE    EKG None  Radiology [place] RT UPPER EXTREM LTD SOFT TISSUE NON VASCULAR  Result Date: 06/12/2022 CLINICAL DATA:  Swelling in right axilla for 1 week. Rule out abscess EXAM: ULTRASOUND RIGHT UPPER EXTREMITY LIMITED TECHNIQUE: Ultrasound examination  of the upper extremity soft tissues was performed in the area of clinical concern. COMPARISON:  None Available. FINDINGS: Targeted ultrasound in the area of palpable concern in the right axilla demonstrates 2 oval hypoechoic structures containing echogenic hila. One measures 2.0 x 1.5 by 1.7 cm and the second measures 2.0 x 2.0 x 2.6 cm. No fluid collection or abscess. IMPRESSION: Right axillary adenopathy correlates to palpable concern. These demonstrate normal architecture and are favored reactive. Follow-up as clinically indicated. No fluid collection or abscess. Electronically Signed   By: Placido Sou M.D.   On: 06/12/2022 19:38    Procedures Procedures    Medications Ordered in ED Medications - No data to display  ED Course/ Medical Decision Making/ A&P                             Medical Decision Making Amount and/or Complexity of Data Reviewed Labs: ordered. Radiology: ordered.  Risk Prescription drug management.   This patient presents to the ED for abscess, this involves an extensive number of treatment options, and is a complaint that carries with a high risk of complications and morbidity.  The differential diagnosis includes lymphadenopathy, abscess.  This is not an exhaustive list.  Lab tests: I ordered and personally interpreted labs.  The pertinent results include: WBC unremarkable. Hbg unremarkable. Platelets unremarkable. No electrolyte abnormalities noted. BUN, creatinine unremarkable.  Imaging  studies: I ordered imaging studies. I personally reviewed, interpreted imaging and agree with the radiologist's interpretations. The results include: Ultrasound soft tissue of the right axilla show right axillary adenopathy.  There are 2 oval hypoechoic structures containing echogenic hila. One measured 2.0 x 1.5 by 1.7 cm and the second measures 2.0 x 2.0 x 2.6 cm.  Problem list/ ED course/ Critical interventions/ Medical management: HPI: See above Vital signs within normal range and stable throughout visit. Laboratory/imaging studies significant for: See above. On physical examination, patient is afebrile and appears in no acute distress. Based on patient's clinical presentations and laboratory/imaging studies I suspect lymphadenopathy of the R axilla. Patient has been taking Keflex for 4 days with no improvement. I will trial augmentin instead of keflex.  Advised patient to take Tylenol/ibuprofen, apply ice/heat packs for pain, follow-up with general surgery for further evaluation and management.  Return to the ER if new or worsening symptoms.  I have reviewed the patient home medicines and have made adjustments as needed.  Cardiac monitoring/EKG: The patient was maintained on a cardiac monitor.  I personally reviewed and interpreted the cardiac monitor which showed an underlying rhythm of: sinus rhythm.  Additional history obtained: External records from outside source obtained and reviewed including: Chart review including previous notes, labs, imaging.  Disposition Continued outpatient therapy. Follow-up with general surgery recommended for reevaluation of symptoms. Treatment plan discussed with patient.  Pt acknowledged understanding was agreeable to the plan. Worrisome signs and symptoms were discussed with patient, and patient acknowledged understanding to return to the ED if they noticed these signs and symptoms. Patient was stable upon discharge.   This chart was dictated using voice  recognition software.  Despite best efforts to proofread,  errors can occur which can change the documentation meaning.          Final Clinical Impression(s) / ED Diagnoses Final diagnoses:  Lymphadenopathy    Rx / DC Orders ED Discharge Orders          Ordered    amoxicillin-clavulanate (AUGMENTIN) 875-125 MG  tablet  2 times daily        06/12/22 2055              Rex Kras, [place] 06/12/22 2349    Dorie Rank, MD 06/14/22 [phone]

## 2022-06-12 NOTE — ED Notes (Signed)
Pt agreeable with d/c plan as discussed by provider- this nurse has verbally reinforced d/c instructions and provided pt with written copy.  Pt acknowledges verbal understanding and denies any addl questions concerns needs- pt ambulatory indepdently at d/c with steady; vitals stable; no distress

## 2022-06-12 NOTE — Discharge Instructions (Addendum)
Your ultrasound today showed evidence of axillary lymphadenopathy.  Please take tylenol/ibuprofen for pain. I recommend close follow-up with general surgery for reevaluation. Take Augmentin as prescribed.  Please do not hesitate to return to emergency department if worrisome signs symptoms we discussed become apparent.

## 2022-06-12 NOTE — ED Notes (Signed)
ED Provider at bedside. 

## 2022-06-23 ENCOUNTER — Ambulatory Visit (INDEPENDENT_AMBULATORY_CARE_PROVIDER_SITE_OTHER): Payer: 59 | Admitting: Behavioral Health

## 2022-06-23 DIAGNOSIS — F411 Generalized anxiety disorder: Secondary | ICD-10-CM

## 2022-06-23 NOTE — Progress Notes (Signed)
[facility] Counselor/Therapist Progress Note  Patient ID: Jacqueline Simpson, MRN: [id number],    Date: 06/23/2022  Time Spent: 78 min Caregility video; Pt is home in private & Provider @ home working in [employer]   Treatment Type: Individual Therapy  Reported Symptoms: elevated anx/dep & stressors from life events  Mental Status Exam: Appearance:  Casual     Behavior: Appropriate and Sharing  Motor: Normal  Speech/Language:  Clear and Coherent  Affect: Appropriate  Mood: normal  Thought process: normal  Thought content:   WNL  Sensory/Perceptual disturbances:   WNL  Orientation: oriented to person, place, and time/date  Attention: Good  Concentration: Good  Memory: WNL  Fund of knowledge:  Good  Insight:   Good  Judgment:  Good  Impulse Control: Good   Risk Assessment: Danger to Self:  No Self-injurious Behavior: No Danger to Others: No Duty to Warn:no Physical Aggression / Violence:No  Access to Firearms a concern: No  Gang Involvement:No   Subjective: Pt has been sick in Jan & just now recovering fully   Interventions: Solution-Oriented/Positive Psychology  Diagnosis:GAD (generalized anxiety disorder)  Plan: Jacqueline Simpson is concerned for her under arm issue w/lymphadenopathy & MRSA. 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 She will f/u on her underarm lymph node next week.   Target Date: 07/22/2022  Progress: 5  Frequency: Twice monthly  Modality: [name], LMFT

## 2022-06-23 NOTE — Progress Notes (Signed)
                Jacqueline Simpson Jacqueline Henery Betzold, LMFT 

## 2022-07-07 ENCOUNTER — Ambulatory Visit (INDEPENDENT_AMBULATORY_CARE_PROVIDER_SITE_OTHER): Payer: 59 | Admitting: Behavioral Health

## 2022-07-07 DIAGNOSIS — F411 Generalized anxiety disorder: Secondary | ICD-10-CM

## 2022-07-07 NOTE — Progress Notes (Signed)
                Meleah Demeyer L Glanda Spanbauer, LMFT 

## 2022-07-07 NOTE — Progress Notes (Signed)
[facility] Counselor/Therapist Progress Note  Patient ID: Jacqueline Simpson, MRN: [id number],    Date: 07/07/2022  Time Spent: 34 min Caregility video; Pt is home in private & Provider working remotely from [employer]  Treatment Type: Individual Therapy  Reported Symptoms: Elevated anx/dep & stress due to Disability denial  Mental Status Exam: Appearance:  Casual     Behavior: Appropriate and Sharing  Motor: Normal  Speech/Language:  Clear and Coherent  Affect: Appropriate  Mood: normal  Thought process: normal  Thought content:   WNL  Sensory/Perceptual disturbances:   WNL  Orientation: oriented to person, place, and time/date  Attention: Good  Concentration: Good  Memory: WNL  Fund of knowledge:  Good  Insight:   Good  Judgment:  Good  Impulse Control: Good   Risk Assessment: Danger to Self:  No Self-injurious Behavior: No Danger to Others: No Duty to Warn:no Physical Aggression / Violence:No  Access to Firearms a concern: No  Gang Involvement:No   Subjective: Pt is upset due to denial of Disability today. Discussed Disability requirements @ length & how Physician can assist in this area.   Interventions: Cognitive Behavioral Therapy  Diagnosis:GAD (generalized anxiety disorder)  Plan: Jacqueline Simpson will approach her need for Disability w/hopefulness. She will ask her Atty about application to [place] what she should do now?  Target Date: 08/05/2022  Progress: 3  Frequency: Twice monthly  Modality: [name], LMFT

## 2022-07-14 ENCOUNTER — Ambulatory Visit: Payer: 59 | Admitting: Neurology

## 2022-07-19 ENCOUNTER — Ambulatory Visit: Payer: 59 | Admitting: Neurology

## 2022-07-19 ENCOUNTER — Encounter: Payer: Self-pay | Admitting: Neurology

## 2022-07-19 VITALS — BP 133/92 | HR 95 | Ht 59.0 in | Wt 160.5 lb

## 2022-07-19 DIAGNOSIS — R270 Ataxia, unspecified: Secondary | ICD-10-CM | POA: Diagnosis not present

## 2022-07-19 DIAGNOSIS — Z8669 Personal history of other diseases of the nervous system and sense organs: Secondary | ICD-10-CM

## 2022-07-19 DIAGNOSIS — F4010 Social phobia, unspecified: Secondary | ICD-10-CM

## 2022-07-19 DIAGNOSIS — M352 Behcet's disease: Secondary | ICD-10-CM

## 2022-07-19 DIAGNOSIS — R5383 Other fatigue: Secondary | ICD-10-CM

## 2022-07-19 NOTE — Progress Notes (Signed)
[facility]  PATIENT: [name] DOB: 09/11/1995  REFERRING DOCTOR OR PCP:  Dr. Gerilyn Nestle ([facility]); [facility] (PCP) SOURCE: patient, notes in EMR including recent hospitalization, Lab and MRI reports, MRI images personally reviewed..  _________________________________   HISTORICAL  CHIEF COMPLAINT:  Chief Complaint  Patient presents with   Room 11    Pt is here with her Mother. Pt states that things have been going pretty good. Pt states no muscle weakness. Pt states no burning or tingling sensations in her legs or feet. Pt states no numbness in hands or feet.     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  Update 07/19/2022: She is on Cimzia as her main DMT every 14 days   She has no recent episodes of facial numbness (or other numbness, weakness or clumsiness).   MRI in 2021 showed a new pontine lesion when she had new onset facial numbness.    This had been her first relapse in many years.   The facial numbness resolved.  She has had some mouth and genital lesions over the past year and also some bumps on her fingers and toes.    One of her axillary lymph nodes was enlarged.      She sometimes has a vibration sensation in her left ear.     She had been advised to skip a couple doses of Cimzia due to a sinus infection that was not improving initially.     She has some joint pain legs/ankles and hands.     She sees [facility] (Dr.Ziolkowska) whp prescribed her Cimzia.    In the past, she had been on Humira, Remicade and Methotrexate    She had more cognitive issues.   She is having a lot of  anxiety.   She takes one or two lorazepam.   She tried buspirone but did not tolerate it well.   A lot of her anxiety I health anxiety.  Also has social anxiety  She has had other Behcet lesions in the past in the pons, including  Bell's palsy and dysphagia.     She continues to experience a lot of fatigue.   This has been a  long-term problem but worsened after the last relapse.  She has had attention deficit since childhood but could not tolerate medications.    She is sleeping better and no longer takes trazodone.      She has anxiety.  Lorazepam helps.  SSRI's were poorly tolerated. Buspar made her feel weird and she stopped (also had not helped).    Neuro-Behcet's History: She was diagnosed with Neuro-Behcet's around age 41.    She actually had mouth ulcers, dizziness and vomiting episodes multiple times as a younger child and had optic neuritis at age 39.   She had an MRI after the ON and was originally diagnosed with MS and placed on MS medications.   After she also had genital ulcers and more oral ulcers a biopsy was performed consistent with vasculitis.    Since a relapse in 2011, she has had speech slurring.     She saw Dr. Normajean Baxter (Rheum) and Dr. Ebony Cargo (Neuro) in [place] around age 63 and was diagnosed with Neuro-Behcet's.    She had been on Humira (broke through with 2011 relapse) and Remicade (had possible allergic reaction) in the past.    She is on Cimzia (anti-TNF) and is doing well for the most part x several  years.   In 2021 she had an exacerbation due to a right pontine lesion and had facial numbness, weakness and dysphagia and increase in gait issues  IMAGING  The 02/12/2010 MRI showed an enhancing focus near the fourth ventricle in the left cerebellar hemisphere.  Additionally there were nonspecific white matter foci in the hemispheres and cerebellum.    The 03/28/2016 and6/13/ 2019 MRIs showed a stable pattern of nonspecific foci in the cerebral and cerebellar white matter.  The cerebellar lesion was smaller and no longer enhanced on those 2 more recent  MRIs.   Review of labs is unremarkable  The 10/29/2019 MRI showed a new enhancing lesion measuring 15 mm in the right pons/middle cerebellar peduncle c/w Behcet's   REVIEW OF SYSTEMS: Constitutional: No fevers, chills, sweats, or change in appetite.   Has insomnia Eyes: No visual changes, double vision, eye pain Ear, nose and throat: No hearing loss, ear pain, nasal congestion, sore throat Cardiovascular: No chest pain, palpitations Respiratory:  No shortness of breath at rest or with exertion.   No wheezes GastrointestinaI: No nausea, vomiting, diarrhea, abdominal pain, fecal incontinence Genitourinary:  No dysuria, urinary retention or frequency.  No nocturia. Musculoskeletal:  No neck pain, back pain Integumentary: No rash, pruritus, skin lesions Neurological: as above Psychiatric: No depression at this time.  No anxiety Endocrine: No palpitations, diaphoresis, change in appetite, change in weigh or increased thirst Hematologic/Lymphatic:  No anemia, purpura, petechiae. Allergic/Immunologic: No itchy/runny eyes, nasal congestion, recent allergic reactions, rashes  ALLERGIES: Allergies  Allergen Reactions   Colchicine Shortness Of Breath   Milk-Related Compounds Other (See Comments)    GI UPSET   Sulfa Antibiotics Hives and Swelling    SWELLING LIPS   Adhesive [Tape] Other (See Comments)    SKIN IRRITATION   Soap Itching    Certain scented soaps    HOME MEDICATIONS:   PAST MEDICAL HISTORY: Past Medical History:  Diagnosis Date   Anal fistula 12/15/2020   Anxiety    Behcet's disease (HCC)    hx. brain lesions, mouth and skin ulcers   Depression    GERD (gastroesophageal reflux disease)    History of MRSA infection 2015   arms, legs   History of seizures as a child    due to Behcet's disease - no seizures since age 38   Joint pain    due to Behcet's syndrome   Laceration of wrist with tendon involvement [id number]   self-inflicted   Mouth ulcer 12/15/2020   Perianal abscess 12/2019   Scoliosis 12/15/2020   Social anxiety disorder     PAST SURGICAL HISTORY: Past Surgical History:  Procedure Laterality Date   ARTERY AND TENDON REPAIR Left 03/04/2014   Procedure: ARTERY AND TENDON REPAIR;  Surgeon: Leanora Cover, MD;  Location: [facility];  Service: Orthopedics;  Laterality: Left;   COLONOSCOPY WITH ESOPHAGOGASTRODUODENOSCOPY (EGD)  03/11/2021   EXCISION OF SKIN TAG N/A 04/19/2021   Procedure: EXCISION OF ANAL TAG;  Surgeon: Ileana Roup, MD;  Location: [facility];  Service: General;  Laterality: N/A;   LIGATION OF INTERNAL FISTULA TRACT N/A 04/19/2021   Procedure: LIGATION OF INTERNAL FISTULA TRACT;  Surgeon: Ileana Roup, MD;  Location: [facility];  Service: General;  Laterality: N/A;   LUMBAR PUNCTURE  04/28/2003   under anesthesia   PLACEMENT OF SETON N/A 12/17/2020   Procedure: PLACEMENT OF PERIANAL SETON;  Surgeon: Ileana Roup, MD;  Location: [facility];  Service: General;  Laterality: N/A;   RECTAL EXAM UNDER ANESTHESIA N/A 12/17/2020   Procedure: ANORECTAL EXAM UNDER ANESTHESIA;  Surgeon: Ileana Roup, MD;  Location: [facility];  Service: General;  Laterality: N/A;   RECTAL EXAM UNDER ANESTHESIA N/A 04/19/2021   Procedure: ANAL RECTAL EXAM UNDER ANESTHESIA;  Surgeon: Ileana Roup, MD;  Location: [facility];  Service: General;  Laterality: N/A;   WISDOM TOOTH EXTRACTION     WOUND EXPLORATION Left 03/04/2014   Procedure: LEFT WRIST EXPLORATION;  Surgeon: Leanora Cover, MD;  Location: [facility];  Service: Orthopedics;  Laterality: Left;    FAMILY HISTORY: Family History  Problem Relation Age of Onset   Depression Mother    Graves' disease Mother    Irritable bowel syndrome Mother    ADD / ADHD Brother    OCD Brother    Bipolar disorder Maternal Uncle    Colon cancer Maternal Uncle 66   Crohn's disease Cousin    Lupus Cousin    Esophageal cancer Neg Hx    Rectal cancer Neg Hx    Stomach cancer Neg Hx     SOCIAL HISTORY:  Social History   Socioeconomic History   Marital status: Single    Spouse name: Not on file   Number of  children: 0   Years of education: Not on file   Highest education level: Not on file  Occupational History   Occupation: [employer]  Tobacco Use   Smoking status: Every Day    Types: E-cigarettes   Smokeless tobacco: Never   Tobacco comments:    use everyday  Vaping Use   Vaping Use: Every day   Substances: Nicotine, Flavoring  Substance and Sexual Activity   Alcohol use: Yes    Alcohol/week: 1.0 standard drink of alcohol    Types: 1 Glasses of wine per week    Comment: occasionally 1 drink every few months   Drug use: Not Currently    Types: Marijuana    Comment: have not use in over 4 years   Sexual activity: Not Currently    Partners: Female, Female  Other Topics Concern   Not on file  Social History Narrative   Not on file   Social Determinants of Health   Financial Resource Strain: Not on file  Food Insecurity: Not on file  Transportation Needs: Not on file  Physical Activity: Not on file  Stress: Not on file  Social Connections: Not on file  Intimate Partner Violence: Not on file     PHYSICAL EXAM  Vitals:   07/19/22 1600  BP: (!) 133/92  Pulse: 95  Weight: 160 lb 8 oz (72.8 kg)  Height: '4\' 11"'$  (1.499 m)    Body mass index is 32.42 kg/m.   General: The patient is well-developed and well-nourished and in no acute distress.  No sores noted in the oropharynx   Neck: The neck is nontender.  Skin: Extremities are without significant edema.  Musculoskeletal:  Back is nontender  Neurologic Exam  Mental status: The patient is alert and oriented x 3 at the time of the examination. The patient has apparent normal recent and remote memory, with an apparently normal attention span and concentration ability.   Speech is normal.  Cranial nerves: Extraocular movements are full.  She has a mild right APD.  Visual acuity and color vision is reduced OD..  Facial sensation symmetric today..  Reduced strength on right face..  Trapezius strength was normal.  The tongue is  midline, and the patient has symmetric elevation of the soft palate. No obvious hearing deficits are noted.  Motor:  Muscle bulk is normal.   Tone is normal. Strength is  5 / 5 in all 4 extremities.   Sensory: Sensory testing is intact to pinprick, soft touch and vibration sensation in all 4 extremities.  Coordination: Cerebellar testing reveals good finger-nose-finger and heel-to-shin bilaterally.  Gait and station: Station is normal.   The gait is mildly wide..  Tandem gait is wide.   Romberg is negative.  Reflexes: Deep tendon reflexes are symmetric and normal bilaterally.      DIAGNOSTIC DATA (LABS, IMAGING, TESTING) - I reviewed patient records, labs, notes, testing and imaging myself where available.  Lab Results  Component Value Date   WBC 9.0 06/12/2022   HGB 11.5 (L) 06/12/2022   HCT 37.3 06/12/2022   MCV 73.7 (L) 06/12/2022   PLT 261 06/12/2022      Component Value Date/Time   NA 135 06/12/2022 1728   K 3.6 06/12/2022 1728   CL 100 06/12/2022 1728   CO2 24 06/12/2022 1728   GLUCOSE 100 (H) 06/12/2022 1728   BUN 9 06/12/2022 1728   CREATININE 0.55 06/12/2022 1728   CALCIUM 9.6 06/12/2022 1728   PROT 8.8 (H) 06/12/2022 1728   ALBUMIN 4.2 06/12/2022 1728   AST 10 (L) 06/12/2022 1728   ALT 12 06/12/2022 1728   ALKPHOS 80 06/12/2022 1728   BILITOT 0.3 06/12/2022 1728   GFRNONAA >60 06/12/2022 1728   GFRAA >60 10/27/2017 0723       ASSESSMENT AND PLAN  Behcet's disease ([place]) - Plan: Ambulatory referral to Psychology  Social anxiety disorder - Plan: Ambulatory referral to Psychology  H/O optic neuritis  Ataxia  Other fatigue  1.   Continue Cimzia.  For flares can use steroid packs.  If major neuroloic flare, we will check an MRI and may need change or addition in to the Cimzia.     2.  Due to combination of physical and cognitive/mood impairments, she is disabled..  Physical limitations include poor balance and gait, and right vision due to her multiple  brainstem lesions and optic nerve lesion.  Additionally, she has fatigue, attention deficit, anxiety.   3.   Sleep is better with lorazepam most nights.  She is advised to continue to have a regular bedtime and try to get 7 to 8 hours of sleep. 4.   For anxiety she can take prn lorazepam, up to 2 a day.  I will refer her to behavioral health for counseling.  She felt a benefit doing this in the past..    She had not been able to tolerate SSRIs or buspirone  5.   RTC 6 months if stable but call sooner if new or worsening neurologic issues.     Carys Malina A. [name] Shelling, MD, [facility] [id number], 0000000 PM Certified in Neurology, Clinical Neurophysiology, Sleep Medicine, Pain Medicine and Neuroimaging  [facility] [address], [place], [place] [address] [phone]

## 2022-07-20 ENCOUNTER — Telehealth: Payer: Self-pay | Admitting: Neurology

## 2022-07-20 NOTE — Telephone Encounter (Signed)
Referral for psychology fax to [facility]. Phone: [phone], Fax: [phone]

## 2022-07-21 ENCOUNTER — Ambulatory Visit: Payer: 59 | Admitting: Behavioral Health

## 2022-09-06 ENCOUNTER — Telehealth: Payer: Self-pay | Admitting: *Deleted

## 2022-09-06 ENCOUNTER — Other Ambulatory Visit: Payer: Self-pay

## 2022-09-06 ENCOUNTER — Other Ambulatory Visit: Payer: Self-pay | Admitting: Neurology

## 2022-09-06 ENCOUNTER — Telehealth: Payer: Self-pay | Admitting: Neurology

## 2022-09-06 DIAGNOSIS — F4010 Social phobia, unspecified: Secondary | ICD-10-CM

## 2022-09-06 MED ORDER — LORAZEPAM 1 MG PO TABS
ORAL_TABLET | ORAL | 5 refills | Status: DC
Start: 2022-09-06 — End: 2023-01-19

## 2022-09-06 NOTE — Telephone Encounter (Signed)
Gave completed/signed form back to medical records to process for pt. 

## 2022-09-06 NOTE — Telephone Encounter (Signed)
Patient needs refill on Lorazepam to [facility] at [address]

## 2022-09-06 NOTE — Telephone Encounter (Signed)
Pt last seen 07/19/22 and next f/u 01/19/23. Last refilled lorazepam 08/12/22 #45.   Per last note from Dr. Epimenio Foot: "For anxiety she can take prn lorazepam, up to 2 a day.  I will refer her to behavioral health for counseling.  She felt a benefit doing this in the past..    She had not been able to tolerate SSRIs or buspirone "

## 2023-01-19 ENCOUNTER — Encounter: Payer: Self-pay | Admitting: Neurology

## 2023-01-19 ENCOUNTER — Ambulatory Visit: Payer: 59 | Admitting: Neurology

## 2023-01-19 VITALS — BP 131/88 | HR 105 | Ht 59.0 in | Wt 177.8 lb

## 2023-01-19 DIAGNOSIS — R5383 Other fatigue: Secondary | ICD-10-CM

## 2023-01-19 DIAGNOSIS — F4010 Social phobia, unspecified: Secondary | ICD-10-CM

## 2023-01-19 DIAGNOSIS — M352 Behcet's disease: Secondary | ICD-10-CM

## 2023-01-19 DIAGNOSIS — Z8669 Personal history of other diseases of the nervous system and sense organs: Secondary | ICD-10-CM

## 2023-01-19 DIAGNOSIS — R200 Anesthesia of skin: Secondary | ICD-10-CM | POA: Diagnosis not present

## 2023-01-19 DIAGNOSIS — R1310 Dysphagia, unspecified: Secondary | ICD-10-CM

## 2023-01-19 DIAGNOSIS — H539 Unspecified visual disturbance: Secondary | ICD-10-CM

## 2023-01-19 MED ORDER — LORAZEPAM 1 MG PO TABS
ORAL_TABLET | ORAL | 5 refills | Status: DC
Start: 2023-01-19 — End: 2023-08-07

## 2023-01-19 NOTE — Progress Notes (Signed)
[facility]  PATIENT: Jacqueline Simpson DOB: 07/09/1995  REFERRING DOCTOR OR PCP:  Dr. Sharmon Revere ([facility]); [facility] (PCP) SOURCE: patient, notes in EMR including recent hospitalization, Lab and MRI reports, MRI images personally reviewed..  _________________________________   HISTORICAL  CHIEF COMPLAINT:  Chief Complaint  Patient presents with   Follow-up    Rm 11, alone.  Doing ok, stable. Needs refill lorazepam.    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  Update 07/19/2022: She is on Cimzia as her main DMT for Behcet's every 14 days She has sores that flare up some but no new neurologic issues.    She tolerates Cimzia well for the most part but gets occasionally gets sinusitis and needs antibiotics.      She has no recent episodes of facial numbness (or other numbness, weakness or clumsiness or dysphagia).   Last neurologic flare was  2021 with numbness and MRI showing a new pontine lesion when she had new onset facial numbness.    This had been her first relapse in many years.   The facial numbness resolved.    She has had some mouth and genital lesions over the past year.  When these flare they usually improve again after a couple weeks and she does not take any steroid (last took steroid for facial numbness in 2021).      She has some joint pain - mostly knees and fingers.     She sees Rheum (Dr.Ziolkowska) who prescribed her Cimzia.    In the past, she had been on Humira, Remicade and Methotrexate    She had more cognitive issues.   She is having a lot of  anxiety.   She takes one or two lorazepam.   She tried buspirone but did not tolerate it well.   A lot of her anxiety I health anxiety.  Also has social anxiety..     She continues to experience a lot of fatigue.   This has been a long-term problem but worsened after the last relapse.  She has had attention deficit since childhood but could not tolerate  medications.    She is sleeping better and no longer takes trazodone.    She will use lorazepam if more anxious at night.       She has anxiety.  Lorazepam helps.  SSRI's were poorly tolerated. Buspar made her feel weird and she stopped (also had not helped).    She sees a Veterinary surgeon for stress management related issues.   She has felt more anxiou with her diabiity hearing  Neuro-Behcet's History: She was diagnosed with Neuro-Behcet's around age 27.    She actually had mouth ulcers, dizziness and vomiting episodes multiple times as a younger child and had optic neuritis at age 27.   She had an MRI after the ON and was originally diagnosed with MS and placed on MS medications.   After she also had genital ulcers and more oral ulcers a biopsy was performed consistent with vasculitis.    Since a relapse in 2011, she has had speech slurring.     She saw Dr. Olevia Bowens (Rheum) and Dr. Lucinda Dell (Neuro) in [place] around age 27 and was diagnosed with Neuro-Behcet's.    She had been on Humira (broke through with 2011 relapse) and Remicade (had possible allergic reaction) in the past.    She is on Cimzia (anti-TNF) and is doing well for the most part x several  years.   In 2021 she had an exacerbation due to a right pontine lesion and had facial numbness, weakness and dysphagia and increase in gait issues  IMAGING  The 02/12/2010 MRI showed an enhancing focus near the fourth ventricle in the left cerebellar hemisphere.  Additionally there were nonspecific white matter foci in the hemispheres and cerebellum.    The 03/28/2016 and6/13/ 2019 MRIs showed a stable pattern of nonspecific foci in the cerebral and cerebellar white matter.  The cerebellar lesion was smaller and no longer enhanced on those 2 more recent  MRIs.   Review of labs is unremarkable  The 10/29/2019 MRI showed a new enhancing lesion measuring 15 mm in the right pons/middle cerebellar peduncle c/w Behcet's   REVIEW OF SYSTEMS: Constitutional: No fevers,  chills, sweats, or change in appetite.  Has insomnia Eyes: No visual changes, double vision, eye pain Ear, nose and throat: No hearing loss, ear pain, nasal congestion, sore throat Cardiovascular: No chest pain, palpitations Respiratory:  No shortness of breath at rest or with exertion.   No wheezes GastrointestinaI: No nausea, vomiting, diarrhea, abdominal pain, fecal incontinence Genitourinary:  No dysuria, urinary retention or frequency.  No nocturia. Musculoskeletal:  No neck pain, back pain Integumentary: No rash, pruritus, skin lesions Neurological: as above Psychiatric: No depression at this time.  No anxiety Endocrine: No palpitations, diaphoresis, change in appetite, change in weigh or increased thirst Hematologic/Lymphatic:  No anemia, purpura, petechiae. Allergic/Immunologic: No itchy/runny eyes, nasal congestion, recent allergic reactions, rashes  ALLERGIES: Allergies  Allergen Reactions   Colchicine Shortness Of Breath   Milk-Related Compounds Other (See Comments)    GI UPSET   Sulfa Antibiotics Hives and Swelling    SWELLING LIPS   Adhesive [Tape] Other (See Comments)    SKIN IRRITATION   Soap Itching    Certain scented soaps    HOME MEDICATIONS:   PAST MEDICAL HISTORY: Past Medical History:  Diagnosis Date   Anal fistula 12/15/2020   Anxiety    Behcet's disease (HCC)    hx. brain lesions, mouth and skin ulcers   Depression    GERD (gastroesophageal reflux disease)    History of MRSA infection 2015   arms, legs   History of seizures as a child    due to Behcet's disease - no seizures since age 27   Joint pain    due to Behcet's syndrome   Laceration of wrist with tendon involvement 02/28/2014   self-inflicted   Mouth ulcer 12/15/2020   Perianal abscess 12/2019   Scoliosis 12/15/2020   Social anxiety disorder     PAST SURGICAL HISTORY: Past Surgical History:  Procedure Laterality Date   ARTERY AND TENDON REPAIR Left 03/04/2014   Procedure:  ARTERY AND TENDON REPAIR;  Surgeon: Betha Loa, MD;  Location: [facility];  Service: Orthopedics;  Laterality: Left;   COLONOSCOPY WITH ESOPHAGOGASTRODUODENOSCOPY (EGD)  03/11/2021   EXCISION OF SKIN TAG N/A 04/19/2021   Procedure: EXCISION OF ANAL TAG;  Surgeon: Andria Meuse, MD;  Location: [facility];  Service: General;  Laterality: N/A;   LIGATION OF INTERNAL FISTULA TRACT N/A 04/19/2021   Procedure: LIGATION OF INTERNAL FISTULA TRACT;  Surgeon: Andria Meuse, MD;  Location[facility];  Service: General;  Laterality: N/A;   LUMBAR PUNCTURE  04/28/2003   under anesthesia   PLACEMENT OF SETON N/A 12/17/2020   Procedure: PLACEMENT OF PERIANAL SETON;  Surgeon: Andria Meuse, MD;  Location: [facility];  Service: General;  Laterality: N/A;   RECTAL EXAM UNDER ANESTHESIA N/A 12/17/2020   Procedure: ANORECTAL EXAM UNDER ANESTHESIA;  Surgeon: Andria Meuse, MD;  Location: [facility];  Service: General;  Laterality: N/A;   RECTAL EXAM UNDER ANESTHESIA N/A 04/19/2021   Procedure: ANAL RECTAL EXAM UNDER ANESTHESIA;  Surgeon: Andria Meuse, MD;  Location: [facility];  Service: General;  Laterality: N/A;   WISDOM TOOTH EXTRACTION     WOUND EXPLORATION Left 03/04/2014   Procedure: LEFT WRIST EXPLORATION;  Surgeon: Betha Loa, MD;  Location: [facility];  Service: Orthopedics;  Laterality: Left;    FAMILY HISTORY: Family History  Problem Relation Age of Onset   Depression Mother    Graves' disease Mother    Irritable bowel syndrome Mother    ADD / ADHD Brother    OCD Brother    Bipolar disorder Maternal Uncle    Colon cancer Maternal Uncle 61   Crohn's disease Cousin    Lupus Cousin    Esophageal cancer Neg Hx    Rectal cancer Neg Hx    Stomach cancer Neg Hx     SOCIAL HISTORY:  Social History   Socioeconomic History   Marital status: Single     Spouse name: Not on file   Number of children: 0   Years of education: Not on file   Highest education level: Not on file  Occupational History   Occupation: [employer]  Tobacco Use   Smoking status: Every Day    Types: E-cigarettes   Smokeless tobacco: Never   Tobacco comments:    use everyday  Vaping Use   Vaping status: Every Day   Substances: Nicotine, Flavoring  Substance and Sexual Activity   Alcohol use: Yes    Alcohol/week: 1.0 standard drink of alcohol    Types: 1 Glasses of wine per week    Comment: occasionally 1 drink every few months   Drug use: Not Currently    Types: Marijuana    Comment: have not use in over 4 years   Sexual activity: Not Currently    Partners: Female, Female  Other Topics Concern   Not on file  Social History Narrative   Not on file   Social Determinants of Health   Financial Resource Strain: Not on file  Food Insecurity: Not on file  Transportation Needs: Not on file  Physical Activity: Not on file  Stress: Not on file  Social Connections: Unknown (09/24/2021)   Received [facility]   Social Network    Social Network: Not on file  Intimate Partner Violence: Unknown (08/17/2021)   Received from [facility]   HITS    Physically Hurt: Not on file    Insult or Talk Down To: Not on file    Threaten Physical Harm: Not on file    Scream or Curse: Not on file     PHYSICAL EXAM  Vitals:   01/19/23 1543  BP: 131/88  Pulse: (!) 105  Weight: 177 lb 12.8 oz (80.6 kg)  Height: 4\' 11"  (1.499 m)    Body mass index is 35.91 kg/m.  VA:    OS 20/20 OS 20/20 -1      General: The patient is well-developed and well-nourished and in no acute distress.  No sores are noted in the oropharynx   Neck: The neck is nontender.  Skin: Extremities are without significant edema.  Musculoskeletal:  Back is nontender  Neurologic  Exam  Mental status: The patient is alert and oriented x 3 at the time of the  examination. The patient has apparent normal recent and remote memory, with an apparently normal attention span and concentration ability.   Speech is normal.  Cranial nerves: Extraocular movements are full.  She has a mild right APD.   She has mild reduced  color vision OD..  Facial sensation symmetric today..  Reduced strength on right face..  Trapezius strength was normal.  The tongue is midline, and the patient has symmetric elevation of the soft palate. No obvious hearing deficits are noted.  Motor:  Muscle bulk is normal.   Tone is normal. Strength is  5 / 5 in all 4 extremities.   Sensory: Sensory testing is intact to pinprick, soft touch and vibration sensation in all 4 extremities.  Coordination: Cerebellar testing reveals good finger-nose-finger and heel-to-shin bilaterally.  Gait and station: Station is normal.   The gait is mildly wide..  Tandem gait is wide.   Romberg is negative.  Reflexes: Deep tendon reflexes are symmetric and normal bilaterally.      DIAGNOSTIC DATA (LABS, IMAGING, TESTING) - I reviewed patient records, labs, notes, testing and imaging myself where available.  Lab Results  Component Value Date   WBC 9.0 06/12/2022   HGB 11.5 (L) 06/12/2022   HCT 37.3 06/12/2022   MCV 73.7 (L) 06/12/2022   PLT 261 06/12/2022      Component Value Date/Time   NA 135 06/12/2022 1728   K 3.6 06/12/2022 1728   CL 100 06/12/2022 1728   CO2 24 06/12/2022 1728   GLUCOSE 100 (H) 06/12/2022 1728   BUN 9 06/12/2022 1728   CREATININE 0.55 06/12/2022 1728   CALCIUM 9.6 06/12/2022 1728   PROT 8.8 (H) 06/12/2022 1728   ALBUMIN 4.2 06/12/2022 1728   AST 10 (L) 06/12/2022 1728   ALT 12 06/12/2022 1728   ALKPHOS 80 06/12/2022 1728   BILITOT 0.3 06/12/2022 1728   GFRNONAA >60 06/12/2022 1728   GFRAA >60 10/27/2017 0723       ASSESSMENT AND PLAN  Behcet's disease (HCC)  H/O optic neuritis  Right facial numbness  Other fatigue  Social anxiety  disorder  Dysphagia, unspecified type  1.  Neurologically stable currently.   Continue Cimzia.  If major neuroloic flare, we will consider steroid and check an MRI 2.  Due to combination of physical and cognitive/mood impairments, she is disabled..  Physical limitations include poor balance and gait, and right vision due to her multiple brainstem lesions and optic nerve lesion.  Additionally, she has fatigue, attention deficit, anxiety.   3.   Sleep is better with lorazepam most nights.  She is advised to continue to have a regular bedtime and try to get 7 to 8 hours of sleep. 4.   For anxiety she can take prn lorazepam, up to 2 a day.   She had not been able to tolerate SSRIs or buspirone  5.   She notes mild change in OS vision.  Has not seen ophthalmology x many years and will make a referral.  RTC 6 months if stable but call sooner if new or worsening neurologic issues.     Aseem Sessums A. Epimenio Foot, MD, [facility] 01/19/2023, 3:56 PM Certified in Neurology, Clinical Neurophysiology, Sleep Medicine, Pain Medicine and Neuroimaging  [facility] [address], [address] [place], [place] [address] [phone]

## 2023-01-24 ENCOUNTER — Telehealth: Payer: Self-pay | Admitting: Neurology

## 2023-01-24 NOTE — Telephone Encounter (Signed)
Ophthalmology referral faxed to [facility] (fax# [phone], phone# [phone]) 

## 2023-08-07 ENCOUNTER — Ambulatory Visit: Payer: 59 | Admitting: Neurology

## 2023-08-07 ENCOUNTER — Encounter: Payer: Self-pay | Admitting: Neurology

## 2023-08-07 VITALS — BP 135/91 | HR 66 | Ht 59.0 in | Wt 168.0 lb

## 2023-08-07 DIAGNOSIS — F4010 Social phobia, unspecified: Secondary | ICD-10-CM | POA: Diagnosis not present

## 2023-08-07 DIAGNOSIS — Z8669 Personal history of other diseases of the nervous system and sense organs: Secondary | ICD-10-CM | POA: Diagnosis not present

## 2023-08-07 DIAGNOSIS — R5383 Other fatigue: Secondary | ICD-10-CM

## 2023-08-07 DIAGNOSIS — M352 Behcet's disease: Secondary | ICD-10-CM | POA: Diagnosis not present

## 2023-08-07 MED ORDER — LORAZEPAM 1 MG PO TABS: ORAL_TABLET | ORAL | 5 refills | Status: DC

## 2023-08-07 NOTE — Progress Notes (Signed)
 [facility]  PATIENT: Jacqueline Simpson DOB: 23-Sep-1995  REFERRING DOCTOR OR PCP:  Dr. Sharmon Revere (Rheum); [facility] (PCP) SOURCE: patient, notes in EMR including recent hospitalization, Lab and MRI reports, MRI images personally reviewed..  _________________________________   HISTORICAL  CHIEF COMPLAINT:  Chief Complaint  Patient presents with   Follow-up    Pt in room 10.alone.Here for Behcet's disease follow up. Pt reports being stable, has not schedule eye visit yet. No vision changes. Pt would like to discuss referral for new rheumatologist, needs refill on Ativan.    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  Update 08/08/2023: She continues on Cimzia as her main DMT for Behcet's every 14 day.   She has genital sores that flare up frequently but no new neurologic issues.    She tolerates Cimzia well for the most part but gets occasionally gets sinusitis and needs antibiotics.    She has no recent episodes of facial numbness (or other numbness, weakness or clumsiness or dysphagia).   Last neurologic flare was  2021 with numbness and MRI showing a new pontine lesion when she had new onset facial numbness.    This had been her first relapse in many years.  Treated with steroids.  The facial numbness resolved.    She has some joint pain - mostly knees and fingers.     She sees Rheum (Dr.Ziolkowska) who prescribed her Cimzia.    In the past, she had been on Humira, Remicade and Methotrexate    She had more cognitive issues.   She is having a lot of  anxiety.   She takes one or two lorazepam.   She tried buspirone but did not tolerate it well.   A lot of her anxiety I health anxiety.  Also has social anxiety..     She continues to experience a lot of fatigue.   This has been a long-term problem but worsened after the last relapse.  She has had attention deficit since childhood but could not tolerate medications.     She is sleeping better and no longer takes trazodone.    She will use lorazepam if more anxious at night.       She has a lot of anxiety and sees psychiatry.  Lorazepam helps.  She takes 1 every night and sometimes takes a second 1 during the day if she is good to be leaving her home.  SSRI's were poorly tolerated. Buspar made her feel weird and she stopped (also had not helped).      Neuro-Behcet's History: She was diagnosed with Neuro-Behcet's around age 28.    She actually had mouth ulcers, dizziness and vomiting episodes multiple times as a younger child and had optic neuritis at age 28.   She had an MRI after the ON and was originally diagnosed with MS and placed on MS medications.   After she also had genital ulcers and more oral ulcers a biopsy was performed consistent with vasculitis.    Since a relapse in 2011, she has had speech slurring.     She saw Dr. Olevia Bowens (Rheum) and Dr. Lucinda Dell (Neuro) in [place] around age 28 and was diagnosed with Neuro-Behcet's.    She had been on Humira (broke through with 2011 relapse) and Remicade (had possible allergic reaction) in the past.    She is on Cimzia (anti-TNF) and is doing well for the most part x several years.   In  2021 she had an exacerbation due to a right pontine lesion and had facial numbness, weakness and dysphagia and increase in gait issues  IMAGING  The 02/12/2010 MRI showed an enhancing focus near the fourth ventricle in the left cerebellar hemisphere.  Additionally there were nonspecific white matter foci in the hemispheres and cerebellum.    The 03/28/2016 and6/13/ 2019 MRIs showed a stable pattern of nonspecific foci in the cerebral and cerebellar white matter.  The cerebellar lesion was smaller and no longer enhanced on those 2 more recent  MRIs.   Review of labs is unremarkable  The 10/29/2019 MRI showed a new enhancing lesion measuring 15 mm in the right pons/middle cerebellar peduncle c/w Behcet's   REVIEW OF SYSTEMS: Constitutional:  No fevers, chills, sweats, or change in appetite.  Has insomnia Eyes: No visual changes, double vision, eye pain Ear, nose and throat: No hearing loss, ear pain, nasal congestion, sore throat Cardiovascular: No chest pain, palpitations Respiratory:  No shortness of breath at rest or with exertion.   No wheezes GastrointestinaI: No nausea, vomiting, diarrhea, abdominal pain, fecal incontinence Genitourinary:  No dysuria, urinary retention or frequency.  No nocturia. Musculoskeletal:  No neck pain, back pain Integumentary: No rash, pruritus, skin lesions Neurological: as above Psychiatric: No depression at this time.  No anxiety Endocrine: No palpitations, diaphoresis, change in appetite, change in weigh or increased thirst Hematologic/Lymphatic:  No anemia, purpura, petechiae. Allergic/Immunologic: No itchy/runny eyes, nasal congestion, recent allergic reactions, rashes  ALLERGIES: Allergies  Allergen Reactions   Colchicine Shortness Of Breath   Milk-Related Compounds Other (See Comments)    GI UPSET   Sulfa Antibiotics Hives and Swelling    SWELLING LIPS   Adhesive [Tape] Other (See Comments)    SKIN IRRITATION   Soap Itching    Certain scented soaps    HOME MEDICATIONS:   PAST MEDICAL HISTORY: Past Medical History:  Diagnosis Date   Anal fistula 12/15/2020   Anxiety    Behcet's disease (HCC)    hx. brain lesions, mouth and skin ulcers   Depression    GERD (gastroesophageal reflux disease)    History of MRSA infection 2015   arms, legs   History of seizures as a child    due to Behcet's disease - no seizures since age 21   Joint pain    due to Behcet's syndrome   Laceration of wrist with tendon involvement 02/28/2014   self-inflicted   Mouth ulcer 12/15/2020   Perianal abscess 12/2019   Scoliosis 12/15/2020   Social anxiety disorder     PAST SURGICAL HISTORY: Past Surgical History:  Procedure Laterality Date   ARTERY AND TENDON REPAIR Left 03/04/2014    Procedure: ARTERY AND TENDON REPAIR;  Surgeon: Betha Loa, MD;  Location: [facility];  Service: Orthopedics;  Laterality: Left;   COLONOSCOPY WITH ESOPHAGOGASTRODUODENOSCOPY (EGD)  03/11/2021   EXCISION OF SKIN TAG N/A 04/19/2021   Procedure: EXCISION OF ANAL TAG;  Surgeon: Andria Meuse, MD;  Location: [facility];  Service: General;  Laterality: N/A;   LIGATION OF INTERNAL FISTULA TRACT N/A 04/19/2021   Procedure: LIGATION OF INTERNAL FISTULA TRACT;  Surgeon: Andria Meuse, MD;  Location: [facility] [place];  Service: General;  Laterality: N/A;   LUMBAR PUNCTURE  04/28/2003   under anesthesia   PLACEMENT OF SETON N/A 12/17/2020   Procedure: PLACEMENT OF PERIANAL SETON;  Surgeon: Andria Meuse, MD;  Location: [facility];  Service: General;  Laterality: N/A;   RECTAL EXAM UNDER ANESTHESIA N/A 12/17/2020   Procedure: ANORECTAL EXAM UNDER ANESTHESIA;  Surgeon: Andria Meuse, MD;  Location: [facility];  Service: General;  Laterality: N/A;   RECTAL EXAM UNDER ANESTHESIA N/A 04/19/2021   Procedure: ANAL RECTAL EXAM UNDER ANESTHESIA;  Surgeon: Andria Meuse, MD;  Location: [facility];  Service: General;  Laterality: N/A;   WISDOM TOOTH EXTRACTION     WOUND EXPLORATION Left 03/04/2014   Procedure: LEFT WRIST EXPLORATION;  Surgeon: Betha Loa, MD;  Location: [facility];  Service: Orthopedics;  Laterality: Left;    FAMILY HISTORY: Family History  Problem Relation Age of Onset   Depression Mother    Graves' disease Mother    Irritable bowel syndrome Mother    ADD / ADHD Brother    OCD Brother    Bipolar disorder Maternal Uncle    Colon cancer Maternal Uncle 57   Crohn's disease Cousin    Lupus Cousin    Esophageal cancer Neg Hx    Rectal cancer Neg Hx    Stomach cancer Neg Hx     SOCIAL HISTORY:  Social History   Socioeconomic History   Marital status:  Single    Spouse name: Not on file   Number of children: 0   Years of education: Not on file   Highest education level: Not on file  Occupational History   Occupation: [employer]  Tobacco Use   Smoking status: Former    Types: E-cigarettes   Smokeless tobacco: Never   Tobacco comments:    use everyday  Vaping Use   Vaping status: Former   Substances: Nicotine, Flavoring   Devices: quit 1 year ago  Substance and Sexual Activity   Alcohol use: Not Currently    Alcohol/week: 1.0 standard drink of alcohol    Types: 1 Glasses of wine per week    Comment: occasionally 1 drink every few months   Drug use: Not Currently    Types: Marijuana    Comment: have not use in over 4 years   Sexual activity: Not Currently    Partners: Female, Female  Other Topics Concern   Not on file  Social History Narrative   Not on file   Social Drivers of Health   Financial Resource Strain: Not on file  Food Insecurity: Not on file  Transportation Needs: Not on file  Physical Activity: Not on file  Stress: Not on file  Social Connections: Unknown (09/24/2021)   [facility] from [facility], [facility]   Social Network    Social Network: Not on file  Intimate Partner Violence: Unknown (08/17/2021)   Received from [facility]   HITS    Physically Hurt: Not on file    Insult or Talk Down To: Not on file    Threaten Physical Harm: Not on file    Scream or Curse: Not on file     PHYSICAL EXAM  Vitals:   08/07/23 1117  BP: (!) 135/91  Pulse: 66  Weight: 168 lb (76.2 kg)  Height: 4\' 11"  (1.499 m)    Body mass index is 33.93 kg/m.  VA:    OS 20/20 OS 20/20 -1      General: The patient is well-developed and well-nourished and in no acute distress.  No sores are noted in the oropharynx   Neck: The neck is nontender.  Skin: Extremities are without significant edema.  Musculoskeletal:  Back is nontender  Neurologic Exam  Mental status: The patient is alert and oriented  x 3 at the time of the examination. The patient has apparent normal recent and remote memory, with an apparently normal attention span and concentration ability.   Speech is normal.  Cranial nerves: Extraocular movements are full.  She has a mild right APD.   She has mild reduced  color vision OD..  Facial sensation symmetric today..  Reduced strength on right face..  Trapezius strength was normal.  The tongue is midline, and the patient has symmetric elevation of the soft palate. No obvious hearing deficits are noted.  Motor:  Muscle bulk is normal.   Tone is normal. Strength is  5 / 5 in all 4 extremities.   Sensory: Sensory testing is intact to pinprick, soft touch and vibration sensation in all 4 extremities.  Coordination: Cerebellar testing reveals good finger-nose-finger and heel-to-shin bilaterally.  Gait and station: Station is normal.   The gait is slightly wide.  The tandem gait is moderately wide.  This is stable..   Romberg is negative.  Reflexes: Deep tendon reflexes are symmetric and normal bilaterally.      DIAGNOSTIC DATA (LABS, IMAGING, TESTING) - I reviewed patient records, labs, notes, testing and imaging myself where available.  Lab Results  Component Value Date   WBC 9.0 06/12/2022   HGB 11.5 (L) 06/12/2022   HCT 37.3 06/12/2022   MCV 73.7 (L) 06/12/2022   PLT 261 06/12/2022      Component Value Date/Time   NA 135 06/12/2022 1728   K 3.6 06/12/2022 1728   CL 100 06/12/2022 1728   CO2 24 06/12/2022 1728   GLUCOSE 100 (H) 06/12/2022 1728   BUN 9 06/12/2022 1728   CREATININE 0.55 06/12/2022 1728   CALCIUM 9.6 06/12/2022 1728   PROT 8.8 (H) 06/12/2022 1728   ALBUMIN 4.2 06/12/2022 1728   AST 10 (L) 06/12/2022 1728   ALT 12 06/12/2022 1728   ALKPHOS 80 06/12/2022 1728   BILITOT 0.3 06/12/2022 1728   GFRNONAA >60 06/12/2022 1728   GFRAA >60 10/27/2017 0723       ASSESSMENT AND PLAN  Behcet's disease (HCC) - Plan: Ambulatory referral to Rheumatology,  Ambulatory referral to Ophthalmology  H/O optic neuritis - Plan: Ambulatory referral to Ophthalmology  Social anxiety disorder - Plan: LORazepam (ATIVAN) 1 MG tablet  Other fatigue  1.  She remains neurologically stable.  Her last exacerbation neurologically was 2021.   Continue Cimzia.  If major neuroloic flare, we will consider steroid and check an MRI 2.  Due to combination of physical and cognitive/mood impairments, she is disabled..  Physical limitations include poor balance and gait, and right vision due to her multiple brainstem lesions and optic nerve lesion.  Additionally, she has fatigue, attention deficit, anxiety.   3.   Sleep is better with lorazepam most nights.  She is advised to continue to have a regular bedtime and try to get 7 to 8 hours of sleep. 4.   For anxiety she can take prn lorazepam, prescriptions are 45 pills for 30 days to include her nighttime lorazepam and the occasional additional lorazepam.   She had not been able to tolerate SSRIs or buspirone   Continue counseling  5.   She has h/o ON and some visual changes.  Advised to see ophthalmology RTC 6 months if stable but call sooner if new or worsening neurologic issues.     Halsey Hammen A. Epimenio Foot, MD, [facility] 08/07/2023, 11:51 AM Certified in  Neurology, Clinical Neurophysiology, Sleep Medicine, Pain Medicine and Neuroimaging  [facility] [address], [address] [place], [place] [address] [phone]

## 2023-08-08 ENCOUNTER — Telehealth: Payer: Self-pay | Admitting: Neurology

## 2023-08-08 NOTE — Telephone Encounter (Signed)
 Referral for ophthalmology fax to [facility]. Phone: [phone], Fax: [phone]

## 2023-11-27 ENCOUNTER — Other Ambulatory Visit: Payer: Self-pay | Admitting: Neurology

## 2023-11-27 DIAGNOSIS — F4010 Social phobia, unspecified: Secondary | ICD-10-CM

## 2023-11-27 NOTE — Telephone Encounter (Signed)
 I called patient and informed her to wait until she has about 5 days left on medication she can call pharmacy or the office to get the refill.   Rx was picked up on 11/22/23  Pt verbalized she understood.

## 2023-11-27 NOTE — Telephone Encounter (Signed)
 Pt called stating that she just picked up her last refill for her LORazepam  (ATIVAN ) 1 MG tablet and she would like to know if a new Rx with more refills can be sent in to the [facility] on [address] so it can be ready for her next refill. Please advise.

## 2023-12-11 MED ORDER — LORAZEPAM 1 MG PO TABS: ORAL_TABLET | ORAL | 5 refills | Status: DC

## 2023-12-11 NOTE — Telephone Encounter (Signed)
 Pt called stating that she is needing a new Rx for her LORazepam  (ATIVAN ) 1 MG tablet sent in to the [facility] on [address]

## 2023-12-11 NOTE — Telephone Encounter (Signed)
 Last seen 08/07/23 and next f/u 03/11/24. Last refilled 11/22/23 #45.   I spoke w/ Dr. Vear since it appears pt taking more consistently twice daily. He approved changing qty to 60. Sent to MD to e-scribe updated rx.

## 2023-12-11 NOTE — Addendum Note (Signed)
 Addended by: JOSHUA MAURILIO CROME on: 12/11/2023 01:20 PM   Modules accepted: Orders

## 2024-01-03 ENCOUNTER — Telehealth: Payer: Self-pay | Admitting: Neurology

## 2024-01-03 NOTE — Telephone Encounter (Signed)
 Pt dropped off forms to be filled out for lawyer, states they need this filled out by Dr. Vear and not the nurses as the information about the pt has to be very specific. Pt mother called and paid $50 form fee. Pt mother would like a call to pick up when ready.

## 2024-01-04 DIAGNOSIS — Z0289 Encounter for other administrative examinations: Secondary | ICD-10-CM

## 2024-01-09 NOTE — Progress Notes (Signed)
 Office Visit Note  Patient: Jacqueline Simpson             Date of Birth: 10-24-1995           MRN: [id number]             PCP: [facility] [place] Referring: Vear Charlie DELENA, MD Visit Date: 01/10/2024   Subjective:  New Patient (Initial Visit) (Wanted to see another Dr about her Behcets Disease didn't feel like she was getting the care she needed)   Discussed the use of AI scribe software for clinical note transcription with the patient, who gave verbal consent to proceed.  History of Present Illness   Jacqueline Simpson is a 28 year old female with Behet's disease who presents for a rheumatology consultation and transfer of care. She was referred by Dr. Vear for management of Behet's disease.  Diagnosed with Behet's disease around age 55, initially suspected to be childhood MS due to symptoms like optic neuritis. She has been under the care of various specialists, including a Behet's specialist at [facility] and pediatric teams at [facility]. Transitioned to adult care at [facility] and was managed by Dr. Zolkowska before seeking care here.  Currently on Cimzia , a TNF inhibitor, administered every two weeks. Previously on Humira and Remicade. Also took methotrexate and colchicine, with the latter causing discomfort. Experiences a delayed hive reaction at the injection site of Cimzia , occurring just before the next dose.  Experiences genital ulcers approximately once a month, lasting one to two weeks, and uses a topical steroid cream for relief. History of oral ulcers, now less frequent than genital ulcers. Reports an increase in genital ulcers over the past few years.  History of optic neuritis at age 17, leading to initial suspicion of MS. Reports persistent visual changes, including reduced color vision and occasional blurriness.  Experiences photosensitivity, developing rashes on sun-exposed skin, a symptom that has emerged in recent years. Also  reports dry skin, particularly on her hands, and uses lotion daily.  History of gastrointestinal issues, including diarrhea and stomach pain, and was previously diagnosed with a fistula. A colonoscopy and endoscopy two years ago appeared normal.  History of anemia with a hemoglobin level of 10 and smaller than normal red blood cells.  She was not clear if there was a specific diagnosis of iron deficiency anemia but seem to be the suspected problem with no history of hemolysis.  Not on iron supplements due to gastrointestinal side effects.  Experiences anxiety, particularly at night, affecting her sleep. Takes lorazepam  as needed for anxiety and sleep, which helps her sleep through the night. Without it, she struggles with insomnia and nightmares.       Activities of Daily Living:  Patient reports morning stiffness for 30 minutes.   Patient Reports nocturnal pain.  Difficulty dressing/grooming: Denies Difficulty climbing stairs: Reports Difficulty getting out of chair: Denies Difficulty using hands for taps, buttons, cutlery, and/or writing: Denies  Review of Systems  Constitutional:  Positive for fatigue.  HENT:  Positive for mouth sores and mouth dryness.   Eyes:  Negative for dryness.  Respiratory:  Negative for shortness of breath.   Cardiovascular:  Negative for chest pain and palpitations.  Gastrointestinal:  Positive for constipation and diarrhea. Negative for blood in stool.  Endocrine: Negative for increased urination.  Genitourinary:  Negative for involuntary urination.  Musculoskeletal:  Positive for joint pain, gait problem, joint pain, joint swelling, myalgias, muscle weakness, morning stiffness, muscle  tenderness and myalgias.  Skin:  Positive for rash and sensitivity to sunlight. Negative for color change and hair loss.  Allergic/Immunologic: Positive for susceptible to infections.  Neurological:  Positive for dizziness and headaches.  Hematological:  Negative for swollen  glands.  Psychiatric/Behavioral:  Positive for depressed mood and sleep disturbance. The patient is nervous/anxious.     PMFS History:  Patient Active Problem List   Diagnosis Date Noted   Microcytic anemia 01/10/2024   Plantar fasciitis 01/10/2024   Other fatigue 08/24/2020   Dysphagia 08/24/2020   Facial weakness 08/24/2020   Right facial numbness 11/05/2019   Scoliosis 05/21/2018   Upper back pain 05/21/2018   Delayed sleep phase syndrome 11/08/2017   Anxiety 10/26/2017   Ataxia 10/26/2017   Tobacco abuse 10/26/2017   Dizziness    Behcet's disease (HCC) 02/23/2016   H/O optic neuritis 02/23/2016   Social anxiety disorder 05/15/2014   Alcohol abuse 03/01/2014   Abscess of right groin 03/08/2013   MDD (major depressive disorder), single episode, moderate (HCC) 04/18/2012    Past Medical History:  Diagnosis Date   Anal fistula 12/15/2020   Anxiety    Behcet's disease (HCC)    hx. brain lesions, mouth and skin ulcers   Depression    GERD (gastroesophageal reflux disease)    History of MRSA infection 2015   arms, legs   History of seizures as a child    due to Behcet's disease - no seizures since age 8   Joint pain    due to Behcet's syndrome   Laceration of wrist with tendon involvement 02/28/2014   self-inflicted   Mouth ulcer 12/15/2020   Perianal abscess 12/2019   Scoliosis 12/15/2020   Social anxiety disorder     Family History  Problem Relation Age of Onset   Depression Mother    Yvone' disease Mother    Irritable bowel syndrome Mother    Glaucoma Mother    GER disease Father    ADD / ADHD Brother    OCD Brother    Tourette syndrome Brother    Bipolar disorder Maternal Uncle    Colon cancer Maternal Uncle 88   Crohn's disease Cousin    Lupus Cousin    Esophageal cancer Neg Hx    Rectal cancer Neg Hx    Stomach cancer Neg Hx    Past Surgical History:  Procedure Laterality Date   ARTERY AND TENDON REPAIR Left 03/04/2014   Procedure: ARTERY AND  TENDON REPAIR;  Surgeon: Franky Curia, MD;  Location: [facility];  Service: Orthopedics;  Laterality: Left;   COLONOSCOPY WITH ESOPHAGOGASTRODUODENOSCOPY (EGD)  03/11/2021   EXCISION OF SKIN TAG N/A 04/19/2021   Procedure: EXCISION OF ANAL TAG;  Surgeon: Teresa Lonni HERO, MD;  Location:  [place];  Service: General;  Laterality: N/A;   LIGATION OF INTERNAL FISTULA TRACT N/A 04/19/2021   Procedure: LIGATION OF INTERNAL FISTULA TRACT;  Surgeon: Teresa Lonni HERO, MD;  Location: [facility] ;  Service: General;  Laterality: N/A;   LUMBAR PUNCTURE  04/28/2003   under anesthesia   PLACEMENT OF SETON N/A 12/17/2020   Procedure: PLACEMENT OF PERIANAL SETON;  Surgeon: Teresa Lonni HERO, MD;  Location: [facility];  Service: General;  Laterality: N/A;   RECTAL EXAM UNDER ANESTHESIA N/A 12/17/2020   Procedure: ANORECTAL EXAM UNDER ANESTHESIA;  Surgeon: Teresa Lonni HERO, MD;  Location: [facility];  Service: General;  Laterality: N/A;   RECTAL EXAM UNDER ANESTHESIA  N/A 04/19/2021   Procedure: ANAL RECTAL EXAM UNDER ANESTHESIA;  Surgeon: Teresa Lonni HERO, MD;  Location: [facility];  Service: General;  Laterality: N/A;   WISDOM TOOTH EXTRACTION     WOUND EXPLORATION Left 03/04/2014   Procedure: LEFT WRIST EXPLORATION;  Surgeon: Franky Curia, MD;  Location:  S[facility];  Service: Orthopedics;  Laterality: Left;   Social History   Social History Narrative   Not on file   There is no immunization history for the selected administration types on file for this patient.   Objective: Vital Signs: BP 132/89 (BP Location: Right Arm, Patient Position: Sitting, Cuff Size: Normal)   Pulse 94   Resp 16   Ht 4' 11 (1.499 m)   Wt 176 lb (79.8 kg)   LMP 12/27/2023   BMI 35.55 kg/m    Physical Exam HENT:     Mouth/Throat:     Mouth: Mucous membranes are moist.     Pharynx: Oropharynx is  clear.  Eyes:     Conjunctiva/sclera: Conjunctivae normal.  Cardiovascular:     Rate and Rhythm: Normal rate and regular rhythm.  Pulmonary:     Effort: Pulmonary effort is normal.     Breath sounds: Normal breath sounds.  Lymphadenopathy:     Cervical: No cervical adenopathy.  Skin:    General: Skin is warm and dry.     Comments: Erythema and skin dryness on both hands distal to the wrist joint Normal-appearing nailfold capillaroscopy with no digital pitting or telangiectasias  Neurological:     Mental Status: She is alert.     Comments: Normal knee jerk and ankle jerk reflexes bilateral  Psychiatric:        Mood and Affect: Mood normal.      Musculoskeletal Exam:  Shoulders full ROM no tenderness or swelling Elbows full ROM no tenderness or swelling Wrists full ROM no tenderness or swelling Fingers full ROM no tenderness or swelling Scoliosis present, no midline or paraspinal tenderness to pressure Knees full ROM no tenderness or swelling Ankles full ROM no tenderness or swelling Some tenderness to pressure on plantar side of feet anterior to calcaneus MTPs full ROM no tenderness or swelling     Investigation: No additional findings.  Imaging: No results found.  Recent Labs: Lab Results  Component Value Date   WBC 9.0 06/12/2022   HGB 11.5 (L) 06/12/2022   PLT 261 06/12/2022   NA 135 06/12/2022   K 3.6 06/12/2022   CL 100 06/12/2022   CO2 24 06/12/2022   GLUCOSE 100 (H) 06/12/2022   BUN 9 06/12/2022   CREATININE 0.55 06/12/2022   BILITOT 0.3 06/12/2022   ALKPHOS 80 06/12/2022   AST 10 (L) 06/12/2022   ALT 12 06/12/2022   PROT 8.8 (H) 06/12/2022   ALBUMIN 4.2 06/12/2022   CALCIUM 9.6 06/12/2022   GFRAA >60 10/27/2017    Speciality Comments: No specialty comments available.  Procedures:  No procedures performed Allergies: Colchicine, Milk-related compounds, Sulfa antibiotics, Adhesive [tape], and Soap   Assessment / Plan:     Visit Diagnoses:   Behet's disease with neurologic, mucocutaneous, and gastrointestinal involvement Disease activity relatively controlled with Cimzia  but does not seem to be in remission with still breakthrough symptoms every 1 or 2 months and persistent elevation in serum inflammatory markers which have been normal previously although not consistently so. TNF inhibitors recommended for neurologic involvement so I overall agree with the selection so far. MS ruled out.  I would  consider possibility for adding an additional oral small molecule drug possibly azathioprine if she keeps consistently flaring would have to consider the potential risk of further immunosuppression. - Continue Cimzia  200 mg  every two weeks. - Follow up in two months unless symptoms change significantly. - Provide handout on Cimzia  including side effects and risks. - Discuss potential addition of oral medication if symptoms increase.  Photosensitivity associated with autoimmune disease New photosensitivity with rashes on sun-exposed skin. Common in autoimmune diseases, not typically a TNF inhibitor side effect. Results from impaired DNA repair from UV damage. - Advise use of sunscreen and sun avoidance, especially from March to October.  Iron deficiency anemia Likely due to heavy menstrual periods. Hemoglobin is 10 with microcytic red blood cells. Previous iron supplements caused gastrointestinal side effects. - Recommend trying ferrous bisglycinate as an iron supplement for better tolerance. - Consider referral to hematology for iron infusions if oral supplementation is not tolerated or effective.  Anxiety disorder and insomnia Experiences anxiety, particularly at night, and insomnia. Managed with lorazepam  as needed, which is effective.  Raynaud's phenomenon Experiences Raynaud's phenomenon with fingers and toes turning blue when cold, resolving within 15-30 minutes upon warming.  No nailfold capillary changes concerning for secondary  underlying cause.  Xerosis (dry skin of hands) Reports dry skin on hands, uses [employer] lotion daily. More severe dryness managed with stronger moisturizers. - Recommend using Aquaphor or similar products for more effective moisturization.     45 minutes spent on encounter today including review of outside medical records from multiple sources, detailed history and physical examination, addressing several patient questions, counseling and discussion of further treatment options and prognosis.  Orders: No orders of the defined types were placed in this encounter.  No orders of the defined types were placed in this encounter.   Follow-Up Instructions: Return in about 2 months (around 03/11/2024) for New pt Behcets on C[facility] f/u 2mos.   Lonni LELON Ester, MD  Note - This record has been created using [employer].  Chart creation errors have been sought, but may not always  have been located. Such creation errors do not reflect on  the standard of medical care.

## 2024-01-09 NOTE — Telephone Encounter (Signed)
 Gave completed/signed form back to medical records to process for pt.

## 2024-01-09 NOTE — Telephone Encounter (Signed)
 Received completed form. Called and spoke with pts Mother Jacqueline Simpson and advised form was completed. She is going to pick up a copy at the front desk. Original and copy placed in MR office.

## 2024-01-10 ENCOUNTER — Encounter: Payer: Self-pay | Admitting: Internal Medicine

## 2024-01-10 ENCOUNTER — Ambulatory Visit: Attending: Internal Medicine | Admitting: Internal Medicine

## 2024-01-10 VITALS — BP 132/89 | HR 94 | Resp 16 | Ht 59.0 in | Wt 176.0 lb

## 2024-01-10 DIAGNOSIS — M722 Plantar fascial fibromatosis: Secondary | ICD-10-CM

## 2024-01-10 DIAGNOSIS — G4721 Circadian rhythm sleep disorder, delayed sleep phase type: Secondary | ICD-10-CM | POA: Diagnosis not present

## 2024-01-10 DIAGNOSIS — Z8669 Personal history of other diseases of the nervous system and sense organs: Secondary | ICD-10-CM

## 2024-01-10 DIAGNOSIS — F321 Major depressive disorder, single episode, moderate: Secondary | ICD-10-CM

## 2024-01-10 DIAGNOSIS — D509 Iron deficiency anemia, unspecified: Secondary | ICD-10-CM

## 2024-01-10 DIAGNOSIS — M4125 Other idiopathic scoliosis, thoracolumbar region: Secondary | ICD-10-CM

## 2024-01-10 DIAGNOSIS — M352 Behcet's disease: Secondary | ICD-10-CM | POA: Diagnosis not present

## 2024-01-10 NOTE — Patient Instructions (Signed)
 I recommend adding an iron supplement for your anemia. The most common form is ferrous sulfate. Iron glycinate or Iron bisglycinate contains a slower releasing form that is often easier on the stomach and intestines.

## 2024-02-26 ENCOUNTER — Telehealth: Payer: Self-pay | Admitting: Neurology

## 2024-02-26 NOTE — Telephone Encounter (Signed)
 Pt is asking for a call to discuss a buzzing feeling she has had since Friday, she states she is feeling better as a result of a increase to 10 mg in medication predniSONE   Please call pt to discuss.

## 2024-02-27 NOTE — Telephone Encounter (Signed)
 Last seen 08/07/23 by Dr. Vear. Next f/u: 03/11/24 with Dr. Vear.   Called pt at [phone]. LVM.

## 2024-02-27 NOTE — Telephone Encounter (Addendum)
 Took call from phone room and spoke w/ pt.   Reports buzzing feeling when she looks down near rub cage. States she can hear this sound. Has been going away since she has been taking steroids since that were started by Rheumatologist 02/23/24. She has prednisone  10mg , taking 2 tablets daily.  Unsure how long she should take this for. Typically tapers off once sx improve. Denies any vision changes.  Offered appt 02/29/24 at 1:30pm with Dr. Vear for sooner appt/evaluation. She accepted.   Aware I will route to MD to review and make sure he does not recommend anything prior to f/u.

## 2024-02-29 ENCOUNTER — Encounter: Payer: Self-pay | Admitting: Neurology

## 2024-02-29 ENCOUNTER — Ambulatory Visit: Admitting: Neurology

## 2024-02-29 VITALS — BP 112/83 | HR 94 | Ht 59.0 in | Wt 166.5 lb

## 2024-02-29 DIAGNOSIS — R200 Anesthesia of skin: Secondary | ICD-10-CM

## 2024-02-29 DIAGNOSIS — M352 Behcet's disease: Secondary | ICD-10-CM

## 2024-02-29 DIAGNOSIS — R29818 Other symptoms and signs involving the nervous system: Secondary | ICD-10-CM

## 2024-02-29 DIAGNOSIS — R202 Paresthesia of skin: Secondary | ICD-10-CM | POA: Diagnosis not present

## 2024-02-29 MED ORDER — ALPRAZOLAM 1 MG PO TABS: ORAL_TABLET | ORAL | 0 refills | Status: AC

## 2024-02-29 MED ORDER — PREDNISONE 5 MG PO TABS: ORAL_TABLET | ORAL | 1 refills | Status: AC

## 2024-02-29 NOTE — Progress Notes (Signed)
 [facility]  PATIENT: Jacqueline Simpson DOB: December 09, 1995  REFERRING DOCTOR OR PCP:  Dr. Ziolkowska (Rheum); [facility] (PCP) SOURCE: patient, notes in EMR including recent hospitalization, Lab and MRI reports, MRI images personally reviewed..  _________________________________   HISTORICAL  CHIEF COMPLAINT:  Chief Complaint  Patient presents with   Follow-up    Pt in room 11. Mother in room. Here for worsening symptoms. Reports buzzing feeling when she looks down near rub cage. Noticed symptoms more in the morning when waking up, taking steroids since Friday.    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  Update 08/08/2023: She notes a Lhermitte sign - a buzzing in her abdomen when she looks down.  This started a month ago..  This happens more at night.  She feels it has improved the last week.   She denies hand/leg numness, weakness or clumsiness.   No bladder changes.     She continues on Cimzia  as her main DMT for Behcet's every 14 day.   Also on prednisone  10 mg daily.   No current genital or oral sore. These often flare She tolerates Cimzia  well for the most part but gets occasionally gets sinusitis and needs antibiotics.    She has no recent episodes of facial numbness (or other numbness, weakness or clumsiness or dysphagia).   Last neurologic flare was  2021 with numbness and MRI showing a new pontine lesion when she had new onset facial numbness.    This had been her first relapse in many years.  Treated with steroids.  The facial numbness resolved.    She has some joint pain - mostly knees and fingers.     She sees Rheum (Dr.Ziolkowska) who prescribed her Cimzia .    In the past, she had been on Humira, Remicade and Methotrexate    She had more cognitive issues.   She is having a lot of  anxiety.   She takes one or two lorazepam .   She tried buspirone  but did not tolerate it well.   A lot of her anxiety I health  anxiety.  Also has social anxiety..     She continues to experience a lot of fatigue.   This has been a long-term problem but worsened after the last relapse.  She has had attention deficit since childhood but could not tolerate medications.    She is sleeping better and no longer takes trazodone.    She will use lorazepam  if more anxious at night.       She has a lot of anxiety and sees psychiatry.  Lorazepam  helps.  She takes 1 every night and sometimes takes a second 1 during the day if she is good to be leaving her home.  SSRI's were poorly tolerated. Buspar  made her feel weird and she stopped (also had not helped).      Neuro-Behcet's History: She was diagnosed with Neuro-Behcet's around age 8.    She actually had mouth ulcers, dizziness and vomiting episodes multiple times as a younger child and had optic neuritis at age 28.   She had an MRI after the ON and was originally diagnosed with MS and placed on MS medications.   After she also had genital ulcers and more oral ulcers a biopsy was performed consistent with vasculitis.    Since a relapse in 2011, she has had speech slurring.     She saw Dr. Yazici (Rheum) and Dr. Adah (Neuro) in  [place]  around age 28 and was diagnosed with Neuro-Behcet's.    She had been on Humira (broke through with 2011 relapse) and Remicade (had possible allergic reaction) in the past.    She is on Cimzia  (anti-TNF) and is doing well for the most part x several years.   In 2021 she had an exacerbation due to a right pontine lesion and had facial numbness, weakness and dysphagia and increase in gait issues  IMAGING  The 02/12/2010 MRI showed an enhancing focus near the fourth ventricle in the left cerebellar hemisphere.  Additionally there were nonspecific white matter foci in the hemispheres and cerebellum.    The 03/28/2016 and6/13/ 2019 MRIs showed a stable pattern of nonspecific foci in the cerebral and cerebellar white matter.  The cerebellar lesion was smaller and  no longer enhanced on those 2 more recent  MRIs.   Review of labs is unremarkable  The 10/29/2019 MRI showed a new enhancing lesion measuring 15 mm in the right pons/middle cerebellar peduncle c/w Behcet's   REVIEW OF SYSTEMS: Constitutional: No fevers, chills, sweats, or change in appetite.  Has insomnia Eyes: No visual changes, double vision, eye pain Ear, nose and throat: No hearing loss, ear pain, nasal congestion, sore throat Cardiovascular: No chest pain, palpitations Respiratory:  No shortness of breath at rest or with exertion.   No wheezes GastrointestinaI: No nausea, vomiting, diarrhea, abdominal pain, fecal incontinence Genitourinary:  No dysuria, urinary retention or frequency.  No nocturia. Musculoskeletal:  No neck pain, back pain Integumentary: No rash, pruritus, skin lesions Neurological: as above Psychiatric: No depression at this time.  No anxiety Endocrine: No palpitations, diaphoresis, change in appetite, change in weigh or increased thirst Hematologic/Lymphatic:  No anemia, purpura, petechiae. Allergic/Immunologic: No itchy/runny eyes, nasal congestion, recent allergic reactions, rashes  ALLERGIES: Allergies  Allergen Reactions   Colchicine Shortness Of Breath   Milk-Related Compounds Other (See Comments)    GI UPSET   Sulfa Antibiotics Hives and Swelling    SWELLING LIPS   Adhesive [Tape] Other (See Comments)    SKIN IRRITATION   Soap Itching    Certain scented soaps    HOME MEDICATIONS:   PAST MEDICAL HISTORY: Past Medical History:  Diagnosis Date   Anal fistula 12/15/2020   Anxiety    Behcet's disease (HCC)    hx. brain lesions, mouth and skin ulcers   Depression    GERD (gastroesophageal reflux disease)    History of MRSA infection 2015   arms, legs   History of seizures as a child    due to Behcet's disease - no seizures since age 28   Joint pain    due to Behcet's syndrome   Laceration of wrist with tendon involvement 02/28/2014    self-inflicted   Mouth ulcer 12/15/2020   Perianal abscess 12/2019   Scoliosis 12/15/2020   Social anxiety disorder     PAST SURGICAL HISTORY: Past Surgical History:  Procedure Laterality Date   ARTERY AND TENDON REPAIR Left 03/04/2014   Procedure: ARTERY AND TENDON REPAIR;  Surgeon: Franky Curia, MD;  Location: [facility];  Service: Orthopedics;  Laterality: Left;   COLONOSCOPY WITH ESOPHAGOGASTRODUODENOSCOPY (EGD)  03/11/2021   EXCISION OF SKIN TAG N/A 04/19/2021   Procedure: EXCISION OF ANAL TAG;  Surgeon: Teresa Lonni HERO, MD;  Location: [facility];  Service: General;  Laterality: N/A;   LIGATION OF INTERNAL FISTULA TRACT N/A 04/19/2021   Procedure: LIGATION OF INTERNAL FISTULA TRACT;  Surgeon: Teresa Lonni  M, MD;  Location: [facility];  Service: General;  Laterality: N/A;   LUMBAR PUNCTURE  04/28/2003   under anesthesia   PLACEMENT OF SETON N/A 12/17/2020   Procedure: PLACEMENT OF PERIANAL SETON;  Surgeon: Teresa Lonni HERO, MD;  Location: [facility];  Service: General;  Laterality: N/A;   RECTAL EXAM UNDER ANESTHESIA N/A 12/17/2020   Procedure: ANORECTAL EXAM UNDER ANESTHESIA;  Surgeon: Teresa Lonni HERO, MD;  Location: [facility];  Service: General;  Laterality: N/A;   RECTAL EXAM UNDER ANESTHESIA N/A 04/19/2021   Procedure: ANAL RECTAL EXAM UNDER ANESTHESIA;  Surgeon: Teresa Lonni HERO, MD;  Location: [facility];  Service: General;  Laterality: N/A;   WISDOM TOOTH EXTRACTION     WOUND EXPLORATION Left 03/04/2014   Procedure: LEFT WRIST EXPLORATION;  Surgeon: Franky Curia, MD;  Location: [facility];  Service: Orthopedics;  Laterality: Left;    FAMILY HISTORY: Family History  Problem Relation Age of Onset   Depression Mother    Graves' disease Mother    Irritable bowel syndrome Mother    Glaucoma Mother    GER disease Father    ADD / ADHD Brother     OCD Brother    Tourette syndrome Brother    Bipolar disorder Maternal Uncle    Colon cancer Maternal Uncle 72   Crohn's disease Cousin    Lupus Cousin    Esophageal cancer Neg Hx    Rectal cancer Neg Hx    Stomach cancer Neg Hx     SOCIAL HISTORY:  Social History   Socioeconomic History   Marital status: Single    Spouse name: Not on file   Number of children: 0   Years of education: Not on file   Highest education level: Not on file  Occupational History   Occupation: [employer]  Tobacco Use   Smoking status: Never    Passive exposure: Never   Smokeless tobacco: Never  Vaping Use   Vaping status: Former   Substances: Nicotine , Flavoring   Devices: quit 1 year ago  Substance and Sexual Activity   Alcohol use: Not Currently    Alcohol/week: 1.0 standard drink of alcohol    Types: 1 Glasses of wine per week   Drug use: Not Currently    Types: Marijuana    Comment: have not use in over 4 years   Sexual activity: Not Currently    Partners: Female, Female  Other Topics Concern   Not on file  Social History Narrative   Not on file   Social Drivers of Health   Financial Resource Strain: Not on file  Food Insecurity: Not on file  Transportation Needs: Not on file  Physical Activity: Not on file  Stress: Not on file  Social Connections: Unknown (09/24/2021)   Received from [facility]   Social Network    Social Network: Not on file  Intimate Partner Violence: Unknown (08/17/2021)   Received from [facility]   HITS    Physically Hurt: Not on file    Insult or Talk Down To: Not on file    Threaten Physical Harm: Not on file    Scream or Curse: Not on file     PHYSICAL EXAM  Vitals:   02/29/24 1325  BP: 112/83  Pulse: 94  SpO2: 98%  Weight: 166 lb 8 oz (75.5 kg)  Height: 4' 11 (1.499 m)    Body mass index is 33.63 kg/m.  General: The patient is well-developed and well-nourished and in no acute distress.  No sores are noted in the oropharynx    Neck: The neck is nontender.  Skin: Extremities are without significant edema.  Neurologic Exam  Mental status: The patient is alert and oriented x 3 at the time of the examination. The patient has apparent normal recent and remote memory, with an apparently normal attention span and concentration ability.   Speech is normal.  Cranial nerves: Extraocular movements are full.  She has a mild left  APD.   She has mild reduced  color vision OS..  Facial sensation symmetric today..  Reduced strength on right face..  Trapezius strength was normal.  The tongue is midline, and the patient has symmetric elevation of the soft palate. No obvious hearing deficits are noted.  Motor:  Muscle bulk is normal.   Tone is normal. Strength is  5 / 5 in all 4 extremities.   Sensory: She notes a buzzing sensation in her abdomen when she flexes her neck with chin to the chest.  Sensory testing is intact to pinprick, soft touch and vibration sensation in all 4 extremities.  Coordination: Cerebellar testing reveals good finger-nose-finger and heel-to-shin bilaterally.  Gait and station: Station is normal.   The gait is slightly wide.  The tandem gait is mildly wide.  This seems stable to the last exam.   Romberg is negative.  Reflexes: Deep tendon reflexes are symmetric and normal bilaterally.      DIAGNOSTIC DATA (LABS, IMAGING, TESTING) - I reviewed patient records, labs, notes, testing and imaging myself where available.  Lab Results  Component Value Date   WBC 9.0 06/12/2022   HGB 11.5 (L) 06/12/2022   HCT 37.3 06/12/2022   MCV 73.7 (L) 06/12/2022   PLT 261 06/12/2022      Component Value Date/Time   NA 135 06/12/2022 1728   K 3.6 06/12/2022 1728   CL 100 06/12/2022 1728   CO2 24 06/12/2022 1728   GLUCOSE 100 (H) 06/12/2022 1728   BUN 9 06/12/2022 1728   CREATININE 0.55 06/12/2022 1728   CALCIUM 9.6 06/12/2022 1728   PROT 8.8 (H) 06/12/2022 1728   ALBUMIN 4.2 06/12/2022 1728   AST 10 (L)  06/12/2022 1728   ALT 12 06/12/2022 1728   ALKPHOS 80 06/12/2022 1728   BILITOT 0.3 06/12/2022 1728   GFRNONAA >60 06/12/2022 1728   GFRAA >60 10/27/2017 0723       ASSESSMENT AND PLAN  Neurologic type Behcet syndrome (HCC) - Plan: MR CERVICAL SPINE W WO CONTRAST, MR BRAIN W WO CONTRAST  Behcet's disease (HCC)  Lhermitte sign positive - Plan: MR CERVICAL SPINE W WO CONTRAST  Numbness and tingling - Plan: MR CERVICAL SPINE W WO CONTRAST, MR BRAIN W WO CONTRAST  1.  She has been mostly neurologically stable but now has a Lhermite sign - will need to chek MRI brain and cervical spine and consider change in therapy if new lesions.  Her last exacerbation was in 2021 and she had MRI changes at that time. 2.  Due to combination of physical and cognitive/mood impairments, she is disabled..  Physical limitations include poor balance and gait, and right vision due to her multiple brainstem lesions and optic nerve lesion.  Additionally, she has fatigue, attention deficit, anxiety.   3.   Insomnia and DPSD is much better with lorazepam  most nights.  She is advised to continue to have a regular bedtime and try to get 7  to 8 hours of sleep. 4.   For anxiety she can take prn lorazepam , prescriptions are 45 pills for 30 days to include her nighttime lorazepam  and the occasional additional lorazepam .   She had not been able to tolerate SSRIs or buspirone    Continue counseling  5. RTC 6 months if stable but call sooner if new or worsening neurologic issues.     Moxon Messler A. Vear, MD, [facility] 02/29/2024, 2:11 PM Certified in Neurology, Clinical Neurophysiology, Sleep Medicine, Pain Medicine and Neuroimaging  [facility] [address], [address] [place], [place] [address] [phone]

## 2024-03-11 ENCOUNTER — Ambulatory Visit: Admitting: Neurology

## 2024-03-14 NOTE — Progress Notes (Signed)
 Office Visit Note  Patient: Jacqueline Simpson             Date of Birth: [birth year]           MRN: [id number]             PCP: [facility] [place] Referring: [employer], [place] [facility]* Visit Date: 03/28/2024   Subjective:  Medication Management (Patient is having a buzzing feeling when putting her chin to the chest , took prednisone  until it was gone this was about 2 weeks ago. She has a MRI next weekend to check and see. )   Discussed the use of AI scribe software for clinical note transcription with the patient, who gave verbal consent to proceed.  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 She presents with abdominal discomfort and knee pain.  She experienced a recent episode of abdominal discomfort lasting about two weeks, described as a 'busting feeling' in the stomach area, particularly when bending her head forward. She initially took prednisone  10 mg daily for a week, but discontinued it due to lack of symptom relief. The discomfort resolved spontaneously after stopping the medication. An MRI is scheduled due to a history of a lesion discovered three to four years ago, associated with facial slur symptoms.  She continues to use Cimzia  for her Behcet's disease and has not experienced any new upper respiratory infections since her last visit, although she recalls having one when her symptoms began. Her inflammation markers were previously high, and she is scheduled for lab tests to reassess these levels.  She reports persistent knee pain, particularly in the left knee, present for one to two years. The pain is described as being inside the knee and is exacerbated by activities such as walking up stairs or putting on shoes. The pain is intermittent, and she is unsure if it is related to her Behcet's disease. No new rashes or ulcers. Swelling experienced while  on steroids has subsided. She also describes difficulty with foot positioning, noting that her foot tends to rotate outward, which may contribute to her knee pain.      Previous HPI 01/10/24 Jacqueline Simpson is a 28 year old female with Behet's disease who presents for a rheumatology consultation and transfer of care. She was referred by Dr. Vear for management of Behet's disease.   Diagnosed with Behet's disease around age 67, initially suspected to be childhood MS due to symptoms like optic neuritis. She has been under the care of various specialists, including a Behet's specialist at [facility] and pediatric teams at [facility]. Transitioned to adult care at [facility] and was managed by Dr. Zolkowska before seeking care here.   Currently on Cimzia , a TNF inhibitor, administered every two weeks. Previously on Humira and Remicade. Also took methotrexate and colchicine, with the latter causing discomfort. Experiences a delayed hive reaction at the injection site of Cimzia , occurring just before the next dose.   Experiences genital ulcers approximately once a month, lasting one to two weeks, and uses a topical steroid cream for relief. History of oral ulcers, now less frequent than genital ulcers. Reports an increase in genital ulcers over the past few years.   History of optic neuritis at age 18, leading to initial suspicion of MS. Reports persistent visual changes, including reduced color vision and occasional blurriness.  Experiences photosensitivity, developing rashes on sun-exposed skin, a symptom that has emerged in recent years. Also reports dry skin, particularly on her hands, and uses lotion daily.   History of gastrointestinal issues, including diarrhea and stomach pain, and was previously diagnosed with a fistula. A colonoscopy and endoscopy two years ago appeared normal.   History of anemia with a hemoglobin level of 10 and smaller than normal red blood cells.  She was  not clear if there was a specific diagnosis of iron deficiency anemia but seem to be the suspected problem with no history of hemolysis.  Not on iron supplements due to gastrointestinal side effects.   Experiences anxiety, particularly at night, affecting her sleep. Takes lorazepam  as needed for anxiety and sleep, which helps her sleep through the night. Without it, she struggles with insomnia and nightmares.    Review of Systems  Constitutional:  Positive for fatigue.  HENT:  Negative for mouth sores and mouth dryness.   Eyes:  Negative for dryness.  Respiratory:  Negative for shortness of breath.   Cardiovascular:  Negative for chest pain and palpitations.  Gastrointestinal:  Negative for blood in stool, constipation and diarrhea.  Endocrine: Negative for increased urination.  Genitourinary:  Negative for involuntary urination.  Musculoskeletal:  Positive for joint pain, gait problem, joint pain, joint swelling, myalgias, morning stiffness, muscle tenderness and myalgias. Negative for muscle weakness.  Skin:  Positive for sensitivity to sunlight. Negative for color change, rash and hair loss.  Allergic/Immunologic: Positive for susceptible to infections.  Neurological:  Positive for headaches. Negative for dizziness.  Hematological:  Negative for swollen glands.  Psychiatric/Behavioral:  Positive for depressed mood and sleep disturbance. The patient is nervous/anxious.     PMFS History:  Patient Active Problem List   Diagnosis Date Noted   High risk medication use 03/28/2024   Pain in left knee 03/28/2024   Microcytic anemia 01/10/2024   Plantar fasciitis 01/10/2024   Other fatigue 08/24/2020   Dysphagia 08/24/2020   Facial weakness 08/24/2020   Right facial numbness 11/05/2019   Scoliosis 05/21/2018   Upper back pain 05/21/2018   Delayed sleep phase syndrome 11/08/2017   Anxiety 10/26/2017   Ataxia 10/26/2017   Tobacco abuse 10/26/2017   Dizziness    Behcet's disease (HCC)  02/23/2016   H/O optic neuritis 02/23/2016   Social anxiety disorder 05/15/2014   Alcohol abuse 03/01/2014   Abscess of right groin 03/08/2013   MDD (major depressive disorder), single episode, moderate (HCC) 04/18/2012    Past Medical History:  Diagnosis Date   Anal fistula 12/15/2020   Anxiety    Behcet's disease (HCC)    hx. brain lesions, mouth and skin ulcers   Depression    GERD (gastroesophageal reflux disease)    History of MRSA infection 2015   arms, legs   History of seizures as a child    due to Behcet's disease - no seizures since age 80   Joint pain    due to Behcet's syndrome   Laceration of wrist with tendon involvement 02/28/2014   self-inflicted   Mouth ulcer 12/15/2020   Perianal abscess 12/2019   Scoliosis 12/15/2020   Social anxiety disorder     Family History  Problem Relation Age of Onset   Depression Mother    Yvone' disease Mother    Irritable bowel syndrome Mother    Glaucoma Mother    GER disease Father    ADD / ADHD Brother    OCD Brother  Tourette syndrome Brother    Bipolar disorder Maternal Uncle    Colon cancer Maternal Uncle 58   Crohn's disease Cousin    Lupus Cousin    Esophageal cancer Neg Hx    Rectal cancer Neg Hx    Stomach cancer Neg Hx    Past Surgical History:  Procedure Laterality Date   ARTERY AND TENDON REPAIR Left 03/04/2014   Procedure: ARTERY AND TENDON REPAIR;  Surgeon: Franky Curia, MD;  Location: [facility];  Service: Orthopedics;  Laterality: Left;   COLONOSCOPY WITH ESOPHAGOGASTRODUODENOSCOPY (EGD)  03/11/2021   EXCISION OF SKIN TAG N/A 04/19/2021   Procedure: EXCISION OF ANAL TAG;  Surgeon: Teresa Lonni HERO, MD;  Location: [facility];  Service: General;  Laterality: N/A;   LIGATION OF INTERNAL FISTULA TRACT N/A 04/19/2021   Procedure: LIGATION OF INTERNAL FISTULA TRACT;  Surgeon: Teresa Lonni HERO, MD;  Location: [facility] [place];  Service: General;  Laterality:  N/A;   LUMBAR PUNCTURE  04/28/2003   under anesthesia   PLACEMENT OF SETON N/A 12/17/2020   Procedure: PLACEMENT OF PERIANAL SETON;  Surgeon: Teresa Lonni HERO, MD;  Location: [facility];  Service: General;  Laterality: N/A;   RECTAL EXAM UNDER ANESTHESIA N/A 12/17/2020   Procedure: ANORECTAL EXAM UNDER ANESTHESIA;  Surgeon: Teresa Lonni HERO, MD;  Location: [facility];  Service: General;  Laterality: N/A;   RECTAL EXAM UNDER ANESTHESIA N/A 04/19/2021   Procedure: ANAL RECTAL EXAM UNDER ANESTHESIA;  Surgeon: Teresa Lonni HERO, MD;  Location: [facility];  Service: General;  Laterality: N/A;   WISDOM TOOTH EXTRACTION     WOUND EXPLORATION Left 03/04/2014   Procedure: LEFT WRIST EXPLORATION;  Surgeon: Franky Curia, MD;  Location: [facility];  Service: Orthopedics;  Laterality: Left;   Social History   Social History Narrative   Not on file   There is no immunization history for the selected administration types on file for this patient.   Objective: Vital Signs: BP 124/79   Pulse (!) 105   Temp 98.4 F (36.9 C)   Resp 16   Ht 4' 11 (1.499 m)   Wt 169 lb 6.4 oz (76.8 kg)   LMP 03/14/2024   BMI 34.21 kg/m    Physical Exam Eyes:     Conjunctiva/sclera: Conjunctivae normal.  Cardiovascular:     Rate and Rhythm: Normal rate and regular rhythm.  Pulmonary:     Effort: Pulmonary effort is normal.     Breath sounds: Normal breath sounds.  Lymphadenopathy:     Cervical: No cervical adenopathy.  Skin:    General: Skin is warm and dry.     Comments: Erythema and skin dryness on both hands  Neurological:     Mental Status: She is alert.  Psychiatric:        Mood and Affect: Mood normal.      Musculoskeletal Exam:  Shoulders full ROM no tenderness or swelling Elbows full ROM no tenderness or swelling Wrists full ROM no tenderness or swelling Fingers full ROM no tenderness or swelling Scoliosis present, no  midline or paraspinal tenderness to pressure Knees full ROM, left knee medial joint line tenderness to pressure but without swelling, does have tibial external rotation Ankles full ROM no tenderness or swelling Some tenderness to pressure on plantar side of feet anterior to calcaneus MTPs full ROM no tenderness or swelling   Investigation: No additional findings.  Imaging: No results found.  Recent Labs: Lab Results  Component Value Date   WBC 6.8 03/28/2024   HGB 12.8 03/28/2024   PLT 236 03/28/2024   NA 137 03/28/2024   K 4.5 03/28/2024   CL 103 03/28/2024   CO2 20 03/28/2024   GLUCOSE 72 03/28/2024   BUN 9 03/28/2024   CREATININE 0.54 03/28/2024   BILITOT 0.3 03/28/2024   ALKPHOS 80 06/12/2022   AST 20 03/28/2024   ALT 11 03/28/2024   PROT 8.2 (H) 03/28/2024   ALBUMIN 4.2 06/12/2022   CALCIUM 9.4 03/28/2024   GFRAA >60 10/27/2017    Speciality Comments: No specialty comments available.  Procedures:  No procedures performed Allergies: Colchicine, Milk-related compounds, Sulfa antibiotics, Adhesive [tape], and Soap   Assessment / Plan:     Visit Diagnoses: Behcet's disease (HCC) - Plan: Sedimentation rate, C-reactive protein High inflammation markers previously noted. Neurological symptoms on head flexion suggest possible spinal cord inflammation. MRI scheduled to evaluate for lesions or active inflammation. Azathioprine considered for persistent inflammation due to its safety and compatibility with biologics. Discussed azathioprine side effects: leukopenia, infection risk, rare liver issues, cancer risk. Emphasized monitoring inflammation markers and MRI results before treatment changes. - Awaiting result of mordered MRI brain/cervical spine per D.r sater - Rechecked sedimentation rate and C-reactive protein. - Continuing cimzia  200 mg q10days - Will consider azathioprine if inflammation markers remain high or MRI shows active disease.  High risk medication use -  Plan: CBC with Differential/Platelet, Comprehensive metabolic panel with GFR  Plantar fasciitis  Chronic pain of left knee Left knee pain with tibial external rotation Chronic knee pain possibly linked to tibial external rotation. No osteoarthritis or ligament injury. Pain localized to anterior knee, worsened by pressure and movement. Discussed tibial rotation's role in pain and exercises for foot pronation and tibial alignment. - Recommended physical therapy exercises for external rotation and foot pronation. - Suggested trying use of banded exercises and foot wedges to improve tibial alignment.        Orders: Orders Placed This Encounter  Procedures   Sedimentation rate   C-reactive protein   CBC with Differential/Platelet   Comprehensive metabolic panel with GFR   No orders of the defined types were placed in this encounter.    Follow-Up Instructions: Return in about 3 months (around 06/28/2024) for Behcet on CIM f/u 3mos.   Lonni LELON Ester, MD  Note - This record has been created using [employer].  Chart creation errors have been sought, but may not always  have been located. Such creation errors do not reflect on  the standard of medical care.

## 2024-03-28 ENCOUNTER — Encounter: Payer: Self-pay | Admitting: Internal Medicine

## 2024-03-28 ENCOUNTER — Ambulatory Visit: Attending: Internal Medicine | Admitting: Internal Medicine

## 2024-03-28 VITALS — BP 124/79 | HR 105 | Temp 98.4°F | Resp 16 | Ht 59.0 in | Wt 169.4 lb

## 2024-03-28 DIAGNOSIS — Z79899 Other long term (current) drug therapy: Secondary | ICD-10-CM

## 2024-03-28 DIAGNOSIS — M352 Behcet's disease: Secondary | ICD-10-CM | POA: Diagnosis not present

## 2024-03-28 DIAGNOSIS — M722 Plantar fascial fibromatosis: Secondary | ICD-10-CM

## 2024-03-28 DIAGNOSIS — G8929 Other chronic pain: Secondary | ICD-10-CM

## 2024-03-28 DIAGNOSIS — M25562 Pain in left knee: Secondary | ICD-10-CM

## 2024-03-28 NOTE — Patient Instructions (Signed)
 Consider looking up online physical therapy videos targeting foot pronation or correcting external tibial rotation. Often use of a wedge or resistance band can be helpful in training the correct muscles to activate.

## 2024-03-29 LAB — COMPREHENSIVE METABOLIC PANEL WITH GFR
AG Ratio: 1.2 (calc) (ref 1.0–2.5)
ALT: 11 U/L (ref 6–29)
AST: 20 U/L (ref 10–30)
Albumin: 4.4 g/dL (ref 3.6–5.1)
Alkaline phosphatase (APISO): 60 U/L (ref 31–125)
BUN: 9 mg/dL (ref 7–25)
CO2: 20 mmol/L (ref 20–32)
Calcium: 9.4 mg/dL (ref 8.6–10.2)
Chloride: 103 mmol/L (ref 98–110)
Creat: 0.54 mg/dL (ref 0.50–0.96)
Globulin: 3.8 g/dL — ABNORMAL HIGH (ref 1.9–3.7)
Glucose, Bld: 72 mg/dL (ref 65–99)
Potassium: 4.5 mmol/L (ref 3.5–5.3)
Sodium: 137 mmol/L (ref 135–146)
Total Bilirubin: 0.3 mg/dL (ref 0.2–1.2)
Total Protein: 8.2 g/dL — ABNORMAL HIGH (ref 6.1–8.1)
eGFR: 128 mL/min/1.73m2 (ref >=60)

## 2024-03-29 LAB — CBC WITH DIFFERENTIAL/PLATELET
Absolute Lymphocytes: 1986 {cells}/uL (ref 850–3900)
Absolute Monocytes: 517 {cells}/uL (ref 200–950)
Basophils Absolute: 20 {cells}/uL (ref 0–200)
Basophils Relative: 0.3 %
Eosinophils Absolute: 224 {cells}/uL (ref 15–500)
Eosinophils Relative: 3.3 %
HCT: 40.7 % (ref 35.0–45.0)
Hemoglobin: 12.8 g/dL (ref 11.7–15.5)
MCH: 25.2 pg — ABNORMAL LOW (ref 27.0–33.0)
MCHC: 31.4 g/dL — ABNORMAL LOW (ref 32.0–36.0)
MCV: 80.3 fL (ref 80.0–100.0)
MPV: 12 fL (ref 7.5–12.5)
Monocytes Relative: 7.6 %
Neutro Abs: 4053 {cells}/uL (ref 1500–7800)
Neutrophils Relative %: 59.6 %
Platelets: 236 Thousand/uL (ref 140–400)
RBC: 5.07 Million/uL (ref 3.80–5.10)
RDW: 15.4 % — ABNORMAL HIGH (ref 11.0–15.0)
Total Lymphocyte: 29.2 %
WBC: 6.8 Thousand/uL (ref 3.8–10.8)

## 2024-03-29 LAB — C-REACTIVE PROTEIN: CRP: 15.2 mg/L — ABNORMAL HIGH (ref <8.0)

## 2024-03-29 LAB — SEDIMENTATION RATE: Sed Rate: 28 mm/h — ABNORMAL HIGH (ref 0–20)

## 2024-04-06 ENCOUNTER — Ambulatory Visit
Admission: RE | Admit: 2024-04-06 | Discharge: 2024-04-06 | Disposition: A | Source: Ambulatory Visit | Attending: Neurology | Admitting: Neurology

## 2024-04-06 ENCOUNTER — Ambulatory Visit
Admission: RE | Admit: 2024-04-06 | Discharge: 2024-04-06 | Disposition: A | Discharge status: Home / Self Care | Source: Ambulatory Visit | Attending: Neurology | Admitting: Neurology

## 2024-04-06 DIAGNOSIS — R29818 Other symptoms and signs involving the nervous system: Secondary | ICD-10-CM | POA: Diagnosis not present

## 2024-04-06 DIAGNOSIS — M352 Behcet's disease: Secondary | ICD-10-CM

## 2024-04-06 DIAGNOSIS — R200 Anesthesia of skin: Secondary | ICD-10-CM

## 2024-04-06 MED ORDER — GADOPICLENOL 0.5 MMOL/ML IV SOLN
8.0000 mL | Freq: Once | INTRAVENOUS | Status: AC | PRN
  Administered 2024-04-06: 8 mL via INTRAVENOUS

## 2024-05-06 ENCOUNTER — Encounter: Payer: Self-pay | Admitting: Neurology

## 2024-05-06 NOTE — Telephone Encounter (Signed)
 Pt called to  request to be called about My chart meessage  . Pt stated she perfer to speak to Nurse if possible

## 2024-05-07 NOTE — Telephone Encounter (Signed)
 Pt called back to follow up on  My chart message Pt  would like  a call before holiday

## 2024-05-07 NOTE — Telephone Encounter (Signed)
" ° °  I called the patient back.  She reports an increase in her anxiety this month.  She experiences social anxiety and also reports she has had trouble sleeping so she takes 1 Ativan  every night as well as 1 during the day if needed when going out (this schedule is noted in Dr Duncan last note).  She is due for refill again on 05/10/2024.  Currently she only has about 4 tablets left. She admits to taking an extra tablet earlier this month when she had a disability trial. The patient understands that Dr. Vear is out of the office this week.  She is wondering if it is possible that she can get an extra tablet for tonight before her refill is reinstated on 12/26.  She understands if she cannot and she does not want to jeopardize her future refills with regards to what insurance allows. I told her a message would be sent to work-in provider to address.  "

## 2024-05-09 NOTE — Telephone Encounter (Signed)
 She can decrease to 1/2 nightly to extend her days until the 26

## 2024-06-03 ENCOUNTER — Other Ambulatory Visit: Payer: Self-pay | Admitting: Neurology

## 2024-06-03 DIAGNOSIS — F4010 Social phobia, unspecified: Secondary | ICD-10-CM

## 2024-06-03 MED ORDER — LORAZEPAM 1 MG PO TABS: ORAL_TABLET | ORAL | 5 refills | Status: AC

## 2024-06-03 NOTE — Telephone Encounter (Signed)
 Pt is needing a refill on her LORazepam  (ATIVAN ) 1 MG tablet and is needing it sent to the [facility] on [address]

## 2024-06-03 NOTE — Telephone Encounter (Signed)
 Requested Prescriptions   Pending Prescriptions Disp Refills   LORazepam  (ATIVAN ) 1 MG tablet 60 tablet 5    Sig: Take 1 tablet once or twice daily   Last seen 02/29/24 Next appt 09/25/24 Dispenses   Dispensed Days Supply Quantity Provider Pharmacy  LORAZEPAM  1 MG TABLET 05/10/2024 30 60 each Sater, Charlie LABOR, MD [facility] [phone] - [facility]...  LORAZEPAM  1 MG TABLET 04/10/2024 30 60 each Sater, Charlie LABOR, MD [facility] [phone] - [facility]...  LORAZEPAM  1 MG TABLET 03/11/2024 30 60 each Sater, Charlie LABOR, MD [facility] [phone] - [facility]...  LORAZEPAM  1 MG TABLET 02/10/2024 30 60 each Sater, Charlie LABOR, MD [facility] [phone] - [facility]...  LORAZEPAM  1 MG TABLET 01/11/2024 30 60 each Sater, Charlie LABOR, MD [facility] [phone] - [facility]...  LORAZEPAM  1 MG TABLET 12/13/2023 30 60 each Sater, Charlie LABOR, MD [facility] [phone] - [facility]...  LORAZEPAM  1 MG TABLET 11/22/2023 22 45 each Sater, Charlie LABOR, MD [facility] [phone] - [facility]...  LORAZEPAM  1 MG TABLET 11/01/2023 22 45 each Sater, Charlie LABOR, MD [facility] [phone] - [facility]...  LORAZEPAM  1 MG TABLET 10/09/2023 22 45 each Sater, Charlie LABOR, MD [facility] [phone] - [facility]...  LORAZEPAM  1 MG TABLET 09/18/2023 22 45 each Sater, Charlie LABOR, MD [facility] [phone] - G...  LORAZEPAM  1 MG TABLET 08/28/2023 22 45 each Sater, Charlie LABOR, MD [facility] [phone] - [facility]...  LORAZEPAM  1 MG TABLET 08/07/2023 22 45 each Sater, Charlie LABOR, MD [facility] [phone] - G...  LORAZEPAM  1 MG TABLET 07/13/2023 23 45 each Sater, Charlie LABOR, MD [facility] [phone] - G...  LORAZEPAM  1 MG TABLET 06/09/2023 23 45 each Sater, Charlie LABOR, MD [facility] [phone] - [email or web address]

## 2024-06-28 ENCOUNTER — Ambulatory Visit: Admitting: Internal Medicine

## 2024-07-17 ENCOUNTER — Ambulatory Visit: Admitting: Internal Medicine

## 2024-09-25 ENCOUNTER — Ambulatory Visit: Admitting: Neurology
